# Patient Record
Sex: Male | Born: 1935 | Race: White | Hispanic: No | Marital: Married | Smoking: Never smoker
Health system: Southern US, Community
[De-identification: ages and names within clinical notes are randomized; demographics above are authoritative.]

## PROBLEM LIST (undated history)

## (undated) DIAGNOSIS — R443 Hallucinations, unspecified: Secondary | ICD-10-CM

## (undated) DIAGNOSIS — D518 Other vitamin B12 deficiency anemias: Secondary | ICD-10-CM

## (undated) DIAGNOSIS — G20 Parkinson's disease: Secondary | ICD-10-CM

## (undated) DIAGNOSIS — G459 Transient cerebral ischemic attack, unspecified: Secondary | ICD-10-CM

## (undated) DIAGNOSIS — F068 Other specified mental disorders due to known physiological condition: Secondary | ICD-10-CM

## (undated) HISTORY — DX: Other specified mental disorders due to known physiological condition: F06.8

## (undated) HISTORY — PX: APPENDECTOMY: SHX54

## (undated) HISTORY — DX: Parkinson's disease: G20

## (undated) HISTORY — PX: TRANSURETHRAL RESECTION OF PROSTATE: SHX73

## (undated) HISTORY — PX: CHOLECYSTECTOMY: SHX55

## (undated) HISTORY — DX: Other vitamin B12 deficiency anemias: D51.8

## (undated) HISTORY — DX: Transient cerebral ischemic attack, unspecified: G45.9

## (undated) HISTORY — DX: Hallucinations, unspecified: R44.3

---

## 1985-05-15 HISTORY — PX: PTCA: SHX146

## 2001-09-13 ENCOUNTER — Encounter: Admission: RE | Admit: 2001-09-13 | Discharge: 2001-09-13 | Payer: Self-pay | Admitting: Family Medicine

## 2001-09-13 ENCOUNTER — Encounter: Payer: Self-pay | Admitting: Family Medicine

## 2004-06-13 ENCOUNTER — Encounter: Admission: RE | Admit: 2004-06-13 | Discharge: 2004-06-13 | Payer: Self-pay | Admitting: Family Medicine

## 2006-12-10 ENCOUNTER — Encounter: Admission: RE | Admit: 2006-12-10 | Discharge: 2006-12-10 | Payer: Self-pay | Admitting: Family Medicine

## 2006-12-17 ENCOUNTER — Encounter: Admission: RE | Admit: 2006-12-17 | Discharge: 2006-12-17 | Payer: Self-pay | Admitting: Family Medicine

## 2008-07-02 ENCOUNTER — Emergency Department (HOSPITAL_COMMUNITY): Admission: EM | Admit: 2008-07-02 | Discharge: 2008-07-02 | Payer: Self-pay | Admitting: Emergency Medicine

## 2009-07-08 ENCOUNTER — Encounter (INDEPENDENT_AMBULATORY_CARE_PROVIDER_SITE_OTHER): Payer: Self-pay | Admitting: Internal Medicine

## 2009-07-08 ENCOUNTER — Ambulatory Visit: Payer: Self-pay | Admitting: Surgery

## 2009-07-08 ENCOUNTER — Inpatient Hospital Stay (HOSPITAL_COMMUNITY): Admission: EM | Admit: 2009-07-08 | Discharge: 2009-07-09 | Payer: Self-pay | Admitting: Emergency Medicine

## 2010-08-03 LAB — CBC
HCT: 37.3 % — ABNORMAL LOW (ref 39.0–52.0)
MCV: 92.2 fL (ref 78.0–100.0)
Platelets: 185 10*3/uL (ref 150–400)
RDW: 13.5 % (ref 11.5–15.5)
WBC: 8 10*3/uL (ref 4.0–10.5)

## 2010-08-03 LAB — COMPREHENSIVE METABOLIC PANEL
AST: 16 U/L (ref 0–37)
Alkaline Phosphatase: 58 U/L (ref 39–117)
CO2: 24 mEq/L (ref 19–32)
Chloride: 102 mEq/L (ref 96–112)
Creatinine, Ser: 1.1 mg/dL (ref 0.4–1.5)
GFR calc Af Amer: >60 mL/min (ref >60)
GFR calc non Af Amer: >60 mL/min (ref >60)
Total Bilirubin: 0.7 mg/dL (ref 0.3–1.2)

## 2010-08-03 LAB — URINALYSIS, ROUTINE W REFLEX MICROSCOPIC
Bilirubin Urine: NEGATIVE
Glucose, UA: NEGATIVE mg/dL
Hgb urine dipstick: NEGATIVE
Ketones, ur: 15 mg/dL — AB
pH: 6 (ref 5.0–8.0)

## 2010-08-03 LAB — DIFFERENTIAL
Basophils Absolute: 0 10*3/uL (ref 0.0–0.1)
Basophils Relative: 0 % (ref 0–1)
Eosinophils Absolute: 0.1 10*3/uL (ref 0.0–0.7)
Eosinophils Relative: 1 % (ref 0–5)
Lymphocytes Relative: 9 % — ABNORMAL LOW (ref 12–46)

## 2010-08-03 LAB — TROPONIN I: Troponin I: 0.02 ng/mL (ref 0.00–0.06)

## 2010-08-03 LAB — PROTIME-INR: Prothrombin Time: 13.8 seconds (ref 11.6–15.2)

## 2012-08-06 ENCOUNTER — Ambulatory Visit (INDEPENDENT_AMBULATORY_CARE_PROVIDER_SITE_OTHER): Payer: Medicare Other | Admitting: *Deleted

## 2012-08-06 DIAGNOSIS — D518 Other vitamin B12 deficiency anemias: Secondary | ICD-10-CM

## 2012-08-06 MED ORDER — CYANOCOBALAMIN 1000 MCG/ML IJ SOLN
1000.0000 ug | INTRAMUSCULAR | Status: AC
  Administered 2012-08-06 – 2012-12-26 (×5): 1000 ug via INTRAMUSCULAR

## 2012-08-06 NOTE — Progress Notes (Signed)
Pt here for B 12 injection.  Under aseptic technique cyanocobalamin 1000mcg/1ml IM given L deltoid.  Tolerated well.  Bandaid applied.  

## 2012-08-28 ENCOUNTER — Other Ambulatory Visit: Payer: Self-pay

## 2012-08-28 MED ORDER — CARBIDOPA-LEVODOPA 25-100 MG PO TABS: ORAL_TABLET | ORAL | Status: DC

## 2012-08-28 NOTE — Telephone Encounter (Signed)
Former [facility] patient assigned to Dr Athar.  

## 2012-09-11 ENCOUNTER — Ambulatory Visit: Payer: Medicare Other

## 2012-09-16 ENCOUNTER — Ambulatory Visit (INDEPENDENT_AMBULATORY_CARE_PROVIDER_SITE_OTHER): Payer: Medicare Other | Admitting: Neurology

## 2012-09-16 DIAGNOSIS — D518 Other vitamin B12 deficiency anemias: Secondary | ICD-10-CM

## 2012-09-16 DIAGNOSIS — E538 Deficiency of other specified B group vitamins: Secondary | ICD-10-CM | POA: Insufficient documentation

## 2012-09-16 NOTE — Progress Notes (Signed)
Patient here for B12 injection.  Under aseptic technique Cyanocobalamin 1000mcg/1ml given IM in right deltoid.  Tolerated well.  Band-Aid applied. 

## 2012-09-16 NOTE — Patient Instructions (Signed)
Patient will return on June 2,2014 for next B12 injection.

## 2012-10-14 ENCOUNTER — Ambulatory Visit (INDEPENDENT_AMBULATORY_CARE_PROVIDER_SITE_OTHER): Payer: Medicare Other | Admitting: *Deleted

## 2012-10-14 DIAGNOSIS — E538 Deficiency of other specified B group vitamins: Secondary | ICD-10-CM

## 2012-10-14 DIAGNOSIS — D518 Other vitamin B12 deficiency anemias: Secondary | ICD-10-CM

## 2012-10-14 NOTE — Progress Notes (Signed)
Pt here for B 12 injection.  Under aseptic technique cyanocobalamin 1000mcg/1ml IM given L deltoid.  Tolerated well.  Bandaid applied.  

## 2012-10-14 NOTE — Patient Instructions (Signed)
Pt to return in 1 month for B 12 injection.

## 2012-11-13 ENCOUNTER — Ambulatory Visit (INDEPENDENT_AMBULATORY_CARE_PROVIDER_SITE_OTHER): Payer: Medicare Other | Admitting: Neurology

## 2012-11-13 DIAGNOSIS — E538 Deficiency of other specified B group vitamins: Secondary | ICD-10-CM

## 2012-11-13 DIAGNOSIS — D518 Other vitamin B12 deficiency anemias: Secondary | ICD-10-CM

## 2012-11-13 NOTE — Progress Notes (Signed)
Patient here for B12 injection.  Under aseptic technique Cyanocobalamin 1000mcg/1ml given IM in left deltoid.  Tolerated well.  Band-Aid applied. 

## 2012-11-13 NOTE — Patient Instructions (Signed)
Patient will return on 12-11-12 for next injection, but call prior to be sure we have B12 available.

## 2012-11-25 ENCOUNTER — Encounter: Payer: Self-pay | Admitting: Neurology

## 2012-11-25 ENCOUNTER — Ambulatory Visit (INDEPENDENT_AMBULATORY_CARE_PROVIDER_SITE_OTHER): Payer: Medicare Other | Admitting: Neurology

## 2012-11-25 VITALS — BP 127/65 | HR 63 | Temp 97.5°F | Ht 60.0 in | Wt 144.0 lb

## 2012-11-25 DIAGNOSIS — F0390 Unspecified dementia without behavioral disturbance: Secondary | ICD-10-CM

## 2012-11-25 DIAGNOSIS — G20 Parkinson's disease: Secondary | ICD-10-CM

## 2012-11-25 MED ORDER — DONEPEZIL HCL 10 MG PO TABS: 10.0000 mg | ORAL_TABLET | Freq: Every day | ORAL | Status: DC

## 2012-11-25 MED ORDER — CARBIDOPA-LEVODOPA 25-100 MG PO TABS: ORAL_TABLET | ORAL | Status: DC

## 2012-11-25 NOTE — Progress Notes (Signed)
Subjective:    Patient ID: Michael [name] is a 79 y.o. male.  HPI  Interim history:   [name] is a very pleasant 79 year old right-handed gentleman who presents for followup consultation of his Parkinson's disease.He is accompanied by his wife today. This is his first visit with me and he previously followed with Dr. Avie [name] and was last seen by him on 07/18/2012, at which time Dr. Sandria [name] felt that he most likely has Parkinson's disease versus Lewy body dementia. He started in ampicillin 5 mg daily. He felt that we may need to cut down his levodopa. The patient has an underlying medical history of vitamin B12 deficiency, chronic low back pain, parkinsonism, angioplasty in 1987, prostate surgery. He currently is on donepezil 5 mg daily, stool softener, Zantac, Sinemet 25-100 mg strength 1-1/2 pills in the morning and lunch and one pill at night, daily aspirin, fish oil, vitamin B 12 injection, VESIcare.  I reviewed Dr. Imagene [name] prior notes and the patient's records and below is a summary of that review:  79 year old right-handed gentleman with a family history of Parkinson's disease who was diagnosed with Parkinson's disease in May 2008 and was initially placed on Requip but developed hallucinations and was switched to Sinemet in September 2008. His symptoms may date back to 2006. He had postural instability and fell in April 2010 fracturing 2 bones in his right wrist and to bones in his left thumb. He had sudden onset of left arm tingling and weakness and was seen at [facility] but was not felt to be a TPA candidate. CT of the head showed chronic small vessel disease. MRI brain showed normal MRI for age. EKG showed first degree AV block. Doppler study of the carotids was normal. Echocardiogram showed an EF of 60-65%. He has had hallucinations and memory loss. In May 2013 his MMSE was 17, clock drawing was 2, animal fluency was 14. In March 2014 his MMSE was 20, clock drawing was 3, animal  fluency was 6. His sister was 27 yo when she past away and she had PD.   He has been tolerating the Aricept 5 mg daily and this seems to have helped the hallucinations, which now are about 1/week. He may see animals or people in the house, he has also been smelling cigarette smoke in the house. His back bothers him a lot. He has been taking tylenol and has taken epidural injections, 4-5 years ago.  His Past Medical History Is Significant For: Past Medical History  Diagnosis Date  . 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 Unspecified transient cerebral ischemia   . Hallucinations   . Other vitamin B12 deficiency anemia   . Paralysis agitans     His Past Surgical History Is Significant For: Past Surgical History  Procedure Laterality Date  . Cholecystectomy    . Transurethral resection of prostate    . Appendectomy    . Ptca  1987    His Family History Is Significant For: Family History  Problem Relation Age of Onset  . Parkinson's disease Sister   . Cancer Brother   . Parkinson's disease Daughter 33    His Social History Is Significant For: History   Social History  . Marital Status: Married    Spouse Name: N/A    Number of Children: N/A  . Years of Education: N/A   Social History Main Topics  . Smoking status: Never Smoker   . Smokeless tobacco: None  .  Alcohol Use: No  . Drug Use: No  . Sexually Active: None   Other Topics Concern  . None   Social History Narrative  . None    His Allergies Are:  No Known Allergies:   His Current Medications Are:  Outpatient Encounter Prescriptions as of 11/25/2012  Medication Sig Dispense Refill  . aspirin 325 MG tablet Take 325 mg by mouth daily.      . carbidopa-levodopa (SINEMET IR) 25-100 MG per tablet 1 tablets po in the morning and at lunch and 1 tablet po in the evening  120 tablet  5  . donepezil (ARICEPT) 5 MG tablet Take 5 mg by mouth daily.      . fish oil-omega-3 fatty  acids 1000 MG capsule Take 1,200 mg by mouth daily.      . hydrocortisone 2.5 % cream       . VESICARE 10 MG tablet        Facility-Administered Encounter Medications as of 11/25/2012  Medication Dose Route Frequency Provider Last Rate Last Dose  . cyanocobalamin ((VITAMIN B-12)) injection 1,000 mcg  1,000 mcg Intramuscular Q30 days Michael Foley, MD   1,000 mcg at 11/13/12 1420  :  Review of Systems  HENT: Positive for hearing loss.   Respiratory:       Snoring  Gastrointestinal: Positive for constipation.       Incontinence  Genitourinary: Positive for difficulty urinating.  Musculoskeletal: Positive for arthralgias.  Neurological: Positive for tremors and speech difficulty.       Memory loss  Psychiatric/Behavioral: Positive for hallucinations and confusion.       Sleepiness    Objective:  Neurologic Exam  Physical Exam Physical Examination:   Filed Vitals:   11/25/12 1106  BP: 127/65  Pulse: 63  Temp: 97.5 F (36.4 C)    General Examination: The patient is a very pleasant 79 y.o. male in no acute distress.  HEENT: Normocephalic, atraumatic, pupils are equal, round and reactive to light and accommodation. Funduscopic exam is normal with sharp disc margins noted. Extraocular tracking shows mild saccadic breakdown without nystagmus noted. There is limitation to upper gaze. There is mild decrease in eye blink rate. Hearing is intact. Face is symmetric with moderate facial masking and normal facial sensation. There is no lip, neck or jaw tremor. Neck is moderately rigid with intact passive ROM. There are no carotid bruits on auscultation. Oropharynx exam reveals mild mouth dryness. No significant airway crowding is noted. Mallampati is class II. Tongue protrudes centrally and palate elevates symmetrically. Poor dental hygiene is noted.   There is mild drooling.   Chest: is clear to auscultation without wheezing, rhonchi or crackles noted.  Heart: sounds are regular and normal  without murmurs, rubs or gallops noted.   Abdomen: is soft, non-tender and non-distended with normal bowel sounds appreciated on auscultation.  Extremities: There is no pitting edema in the distal lower extremities bilaterally. Chronic stasis-like changes are noted in the distal legs bilaterally. There are no varicose veins. He has mild swelling at his R wrist and decrease in ROM in his R forearm. Not new. He is missing the tip of his R index finger.  Skin: is warm and dry with no trophic changes noted. Age-related changes are noted on the skin.   Musculoskeletal: exam reveals no obvious joint deformities, tenderness, joint swelling or erythema.  Neurologically:  Mental status: The patient is awake and alert, paying good  attention. He is able to partially provide the history. His  wife provides details. He is oriented to: person, place, situation, day of week and month of year. His memory, attention, language and knowledge are impaired. There is no aphasia, agnosia, apraxia or anomia. There is a mild degree of bradyphrenia. Speech is moderately hypophonic with mild dysarthria noted. Mood is congruent and affect is normal.    Cranial nerves are as described above under HEENT exam. In addition, shoulder shrug is normal with equal shoulder height noted.  Motor exam: Normal bulk, and strength for age is noted. There are no dyskinesias noted.   Tone is mildly rigid with presence of cogwheeling in the right upper extremity. There is overall moderate bradykinesia. There is no drift or rebound.  There is no resting tremor. Reflexes are 2+ in the upper extremities and trace in the lower extremities.   Fine motor skills exam: Finger taps are moderately impaired on the right and moderately impaired on the left. Hand movements are mildly impaired on the right and mildly impaired on the left. RAP (rapid alternating patting) is moderately impaired on the right and mildly impaired on the left. Foot taps are  moderately impaired on the right and mildly impaired on the left. Foot agility (in the form of heel stomping) is moderately impaired on the right and moderately impaired on the left.    Cerebellar testing shows no dysmetria or intention tremor on finger to nose testing. Heel to shin is unremarkable bilaterally. There is no truncal or gait ataxia.   Sensory exam is intact to light touch.   Gait, station and balance: He stands up from the seated position with mild difficulty and does not need to push up with His hands. He needs no assistance. No veering to one side is noted. He is not noted to lean to the side. Posture is moderately stooped. Stance is wide-based. He walks with decrease in stride length and pace and decreased arm swing; right/left:[id number]}. He turns in 4 steps/en bloc. Tandem walk is not possible. Balance is mildly impaired. He is not able to do a toe or heel stance.      Assessment and Plan:   Assessment and Plan:  In summary, Michael [name] is a very pleasant 79 y.o.-year old male with a history of Parkinson's disease, right-sided predominant, complicated by chronic back pain, dementia, hallucinations. His physical exam is stable and has not progressed in the last 4 months. He is doing fairly well at this time and I reassured the patient and his wife in that regard. She reports some urinary problems, including incontinence and I offered a urology referral, but they want to hold off for now.  I had a long chat with the patient and his wife about His symptoms, my findings and the diagnosis of Parksinson's disease, its prognosis and treatment options. We talked about medical treatments and non-pharmacological approaches. We talked about maintaining a healthy lifestyle in general. I encouraged the patient to eat healthy, exercise daily and keep well hydrated, to keep a scheduled bedtime and wake time routine, to not skip any meals and eat healthy snacks in between meals and to have protein  with every meal. In particular, I stressed the importance of regular exercise, within of course the patient's own mobility limitations. I would like for him to use his cane.  As far as further diagnostic testing is concerned, I suggested: no change.  As far as medications are concerned, I recommended the following at this time: continue with Sinemet. Increase Aricept to the full 10  mg. I answered all their questions today and the patient and his wife were in agreement with the above outlined plan. I would like to see the patient back in 4 months, sooner if the need arises and encouraged them to call with any interim questions, concerns, problems or updates and refill requests and/or referral to urology.

## 2012-11-25 NOTE — Patient Instructions (Addendum)
I think overall you are doing fairly well but I do want to suggest a few things today:  Remember to drink plenty of fluid, eat healthy meals and do not skip any meals. Try to eat protein with a every meal and eat a healthy snack such as fruit or nuts in between meals. Try to keep a regular sleep-wake schedule and try to exercise daily, particularly in the form of walking, 20-30 minutes a day, if you can.   Engage in social activities in your community and with your family and try to keep up with current events by reading the newspaper or watching the news.   As far as your medications are concerned, I would like to suggest  Increasing your Aricept to 10 mg once daily in the evening.   If you wish to see a urologist, we can make a referral.  As far as diagnostic testing: no new test.   I would like to see you back in 4 months, sooner if we need to. Please call us with any interim questions, concerns, problems, updates or refill requests.  Please also call us for any test results so we can go over those with you on the phone. Brett Canales is my clinical assistant and will answer any of your questions and relay your messages to me and also relay most of my messages to you.  Our phone number is [phone]. We also have an after hours call service for urgent matters and there is a physician on-call for urgent questions. For any emergencies you know to call 911 or go to the nearest emergency room.

## 2012-12-11 ENCOUNTER — Ambulatory Visit: Payer: Self-pay

## 2012-12-26 ENCOUNTER — Ambulatory Visit (INDEPENDENT_AMBULATORY_CARE_PROVIDER_SITE_OTHER): Payer: Medicare Other | Admitting: *Deleted

## 2012-12-26 DIAGNOSIS — D518 Other vitamin B12 deficiency anemias: Secondary | ICD-10-CM

## 2012-12-26 DIAGNOSIS — E538 Deficiency of other specified B group vitamins: Secondary | ICD-10-CM

## 2012-12-26 NOTE — Progress Notes (Signed)
Pt here for B 12 injection.  Under aseptic technique cyanocobalamin 1000mcg/1ml IM given L Deltoid.   Tolerated well.  Bandaid applied.  

## 2012-12-26 NOTE — Patient Instructions (Signed)
See next month. 

## 2013-01-23 ENCOUNTER — Ambulatory Visit (INDEPENDENT_AMBULATORY_CARE_PROVIDER_SITE_OTHER): Payer: Medicare Other

## 2013-01-23 DIAGNOSIS — E538 Deficiency of other specified B group vitamins: Secondary | ICD-10-CM

## 2013-01-23 MED ORDER — CYANOCOBALAMIN 1000 MCG/ML IJ SOLN
1000.0000 ug | Freq: Once | INTRAMUSCULAR | Status: AC
  Administered 2013-01-23: 1000 ug via INTRAMUSCULAR

## 2013-01-23 NOTE — Patient Instructions (Signed)
Follow up in one month.

## 2013-01-23 NOTE — Progress Notes (Signed)
Cyanocobalamin administered in R Deltoid under aseptic technique. Tolerated well. Band aid applied.

## 2013-02-24 ENCOUNTER — Ambulatory Visit (INDEPENDENT_AMBULATORY_CARE_PROVIDER_SITE_OTHER): Payer: Medicare Other

## 2013-02-24 DIAGNOSIS — E538 Deficiency of other specified B group vitamins: Secondary | ICD-10-CM

## 2013-02-24 MED ORDER — CYANOCOBALAMIN 1000 MCG/ML IJ SOLN
1000.0000 ug | Freq: Once | INTRAMUSCULAR | Status: AC
  Administered 2013-02-24: 1000 ug via INTRAMUSCULAR

## 2013-02-24 NOTE — Progress Notes (Signed)
Cyanocobalamin 1000 mcg administered in R Deltoid. Tolerated well.

## 2013-02-24 NOTE — Patient Instructions (Signed)
Return in 30 days. 

## 2013-03-27 ENCOUNTER — Ambulatory Visit (INDEPENDENT_AMBULATORY_CARE_PROVIDER_SITE_OTHER): Payer: Medicare Other | Admitting: Neurology

## 2013-03-27 ENCOUNTER — Encounter: Payer: Self-pay | Admitting: Neurology

## 2013-03-27 ENCOUNTER — Ambulatory Visit (INDEPENDENT_AMBULATORY_CARE_PROVIDER_SITE_OTHER): Payer: Self-pay | Admitting: *Deleted

## 2013-03-27 VITALS — BP 106/68 | HR 64 | Temp 97.6°F | Ht 60.0 in | Wt 147.0 lb

## 2013-03-27 DIAGNOSIS — Z0289 Encounter for other administrative examinations: Secondary | ICD-10-CM

## 2013-03-27 DIAGNOSIS — F0390 Unspecified dementia without behavioral disturbance: Secondary | ICD-10-CM

## 2013-03-27 DIAGNOSIS — G20 Parkinson's disease: Secondary | ICD-10-CM

## 2013-03-27 DIAGNOSIS — E538 Deficiency of other specified B group vitamins: Secondary | ICD-10-CM

## 2013-03-27 DIAGNOSIS — R32 Unspecified urinary incontinence: Secondary | ICD-10-CM

## 2013-03-27 HISTORY — DX: Parkinson's disease: G20

## 2013-03-27 MED ORDER — DONEPEZIL HCL 10 MG PO TABS: 10.0000 mg | ORAL_TABLET | Freq: Every day | ORAL | Status: DC

## 2013-03-27 MED ORDER — CYANOCOBALAMIN 1000 MCG/ML IJ SOLN
1000.0000 ug | INTRAMUSCULAR | Status: AC
  Administered 2013-03-27 – 2013-08-15 (×5): 1000 ug via INTRAMUSCULAR

## 2013-03-27 MED ORDER — OXYBUTYNIN CHLORIDE 5 MG PO TABS: ORAL_TABLET | ORAL | Status: DC

## 2013-03-27 MED ORDER — CARBIDOPA-LEVODOPA 25-100 MG PO TABS: ORAL_TABLET | ORAL | Status: DC

## 2013-03-27 NOTE — Patient Instructions (Addendum)
I think your Parkinson's disease has remained fairly stable, which is reassuring. Nevertheless, as you know, this disease does progress with time. It can affect your balance, your memory, your mood, your bowel and bladder function, your posture, balance and walking. Overall you are doing fairly well but I do want to suggest a few things today:  Remember to drink plenty of fluid, eat healthy meals and do not skip any meals. Try to eat protein with a every meal and eat a healthy snack such as fruit or nuts in between meals. Try to keep a regular sleep-wake schedule and try to exercise daily, particularly in the form of walking, 20-30 minutes a day, if you can.   Taking your medication on schedule is key.   Try to stay active physically and mentally. Engage in social activities in your community and with your family and try to keep up with current events by reading the newspaper or watching the news. Try to do word puzzles and you may like to do word puzzles and brain games on the computer such as on [email or web address]   As far as your medications are concerned, I would like to suggest that you take your current medication with the following additional changes: we will try you on ditropan for your bladder. Common side effects include drowsiness, confusion, hallucinations, balance problems.   As far as diagnostic testing, I will order: no new test.    I would like to see you back in 4 months, sooner if we need to. Please call us with any interim questions, concerns, problems, updates or refill requests.  Brett Canales is my clinical assistant and will answer any of your questions and relay your messages to me and also relay most of my messages to you.  Our phone number is [phone]. We also have an after hours call service for urgent matters and there is a physician on-call for urgent questions, that cannot wait till the next work day. For any emergencies you know to call 911 or go to the nearest emergency room.

## 2013-03-27 NOTE — Progress Notes (Signed)
Subjective:    Patient ID: Michael [name] is a 79 y.o. male.  HPI  Interim history:   Michael [name] is a very pleasant 79 year old right-handed gentleman who presents for followup consultation of his right-sided predominant Parkinson's disease, complicated by chronic back pain, dementia, hallucinations and urinary incontinence. He is accompanied by his wife again today. I first met him on 11/25/2012, and which time I did not change his Sinemet dose but increased his Aricept to 10 mg once daily. I suggested a urology referral but they declined. His wife states, they cannot afford the Vesicare and is requesting an alternative. She feels, the increase in Aricept has helped. He is tolerating it. He had the R cataract removed in October 2014 and is scheduled for L cataract removal on 04/01/13. He has drooling.  He previously followed with Dr. Avie Echevaria and was last seen by him on 07/18/2012, at which time Dr. Sandria Manly felt that he most likely has Parkinson's disease versus Lewy body dementia. He started Aricept 5 mg daily. He felt that we may need to cut down his levodopa. The patient has an underlying medical history of vitamin B12 deficiency, chronic low back pain, parkinsonism, angioplasty in 1987, prostate surgery.  He has a family history of Parkinson's disease in his sister who passed away at the age of 60. His daughter was diagnosed with PD in 3/14. She is 77 yo. He was diagnosed with Parkinson's disease in May 2008 and was initially placed on Requip but developed hallucinations and was switched to Sinemet in September 2008. His symptoms may date back to 2006. He had postural instability and fell in April 2010 fracturing 2 bones in his right wrist and two bones in his left thumb. He had sudden onset of left arm tingling and weakness and was seen at [facility] but was not felt to be a TPA candidate. CT of the head showed chronic small vessel disease. MRI brain showed normal MRI for age. EKG showed  first degree AV block. Doppler study of the carotids was normal. Echocardiogram showed an EF of 60-65%. He has had hallucinations and memory loss. In May 2013 his MMSE was 17/30, CDT was 2/4, AFT was 14/minute. In March 2014 his MMSE was 20/30, CDT was 3/4, AFT was 6/minute.  His back bothers him a lot. He has been taking tylenol and has taken epidural injections, 4-5 years ago.   His Past Medical History Is Significant For: Past Medical History  Diagnosis Date  . 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 Unspecified transient cerebral ischemia   . Hallucinations   . Other vitamin B12 deficiency anemia   . Paralysis agitans     His Past Surgical History Is Significant For: Past Surgical History  Procedure Laterality Date  . Cholecystectomy    . Transurethral resection of prostate    . Appendectomy    . Ptca  1987    His Family History Is Significant For: Family History  Problem Relation Age of Onset  . Parkinson's disease Sister   . Cancer Brother   . Parkinson's disease Daughter 48    His Social History Is Significant For: History   Social History  . Marital Status: Married    Spouse Name: N/A    Number of Children: N/A  . Years of Education: N/A   Social History Main Topics  . Smoking status: Never Smoker   . Smokeless tobacco: None  . Alcohol Use: No  .  Drug Use: No  . Sexual Activity: None   Other Topics Concern  . None   Social History Narrative  . None    His Allergies Are:  No Known Allergies:   His Current Medications Are:  Outpatient Encounter Prescriptions as of 03/27/2013  Medication Sig  . aspirin 325 MG tablet Take 325 mg by mouth daily.  . carbidopa-levodopa (SINEMET IR) 25-100 MG per tablet 1 tablets po in the morning and at lunch and 1 tablet po in the evening  . donepezil (ARICEPT) 10 MG tablet Take 1 tablet (10 mg total) by mouth at bedtime.  . fish oil-omega-3 fatty acids 1000 MG capsule Take 1,200  mg by mouth daily.  . hydrocortisone 2.5 % cream   . VESICARE 10 MG tablet   :  Review of Systems:  Out of a complete 14 point review of systems, all are reviewed and negative with the exception of these symptoms as listed below:   Review of Systems  Constitutional: Negative.   HENT: Positive for hearing loss.   Eyes: Negative.   Respiratory: Positive for cough.   Cardiovascular: Negative.   Gastrointestinal: Positive for constipation.  Endocrine: Negative.   Genitourinary: Negative.   Musculoskeletal:       Muscle cramps  Skin: Negative.   Allergic/Immunologic: Negative.   Neurological: Positive for tremors.       Memory loss  Hematological: Negative.   Psychiatric/Behavioral: Positive for hallucinations.    Objective:  Neurologic Exam  Physical Exam Physical Examination:   Filed Vitals:   03/27/13 1436  BP: 106/68  Pulse: 64  Temp: 97.6 F (36.4 C)    General Examination: The patient is a very pleasant 79 y.o. male in no acute distress.  HEENT: Normocephalic, atraumatic, pupils are equal, round and reactive to light and accommodation. Extraocular tracking shows mild saccadic breakdown without nystagmus noted. There is limitation to upper gaze. There is mild decrease in eye blink rate. Hearing is impaired. Face is symmetric with moderate facial masking and normal facial sensation. There is no lip, neck or jaw tremor. Neck is moderately rigid with intact passive ROM. There are no carotid bruits on auscultation. Oropharynx exam reveals mild mouth dryness. No significant airway crowding is noted. Mallampati is class II. Tongue protrudes centrally and palate elevates symmetrically. Poor dental hygiene is noted. There is mild drooling.   Chest: is clear to auscultation without wheezing, rhonchi or crackles noted.  Heart: sounds are regular and normal without murmurs, rubs or gallops noted.   Abdomen: is soft, non-tender and non-distended with normal bowel sounds  appreciated on auscultation.  Extremities: There is no pitting edema in the distal lower extremities bilaterally. There are no varicose veins. He has mild swelling at his R wrist and decrease in ROM in his R forearm. Not new. He is missing the tip of his R index finger.  Skin: is warm and dry with no trophic changes noted. Age-related changes are noted on the skin.   Musculoskeletal: exam reveals no obvious joint deformities, tenderness, joint swelling or erythema.  Neurologically:  Mental status: The patient is awake and alert, paying good  attention. He is not able to his history very well. His wife provides almost the entire. He is oriented to: person, place, situation, day of week and month of year. His memory, attention, language and knowledge are impaired. There is no aphasia, agnosia, apraxia or anomia. There is a moderate degree of bradyphrenia. Speech is moderately hypophonic with mild dysarthria noted. Mood is  congruent and affect is normal.    Cranial nerves are as described above under HEENT exam. In addition, shoulder shrug is normal with equal shoulder height noted.  Motor exam: Normal bulk, and strength for age is noted. There are no dyskinesias noted.   Tone is mildly rigid with presence of cogwheeling in the right upper extremity. There is overall moderate bradykinesia. There is no drift or rebound.  There is no resting tremor. Reflexes are 2+ in the upper extremities and trace in the lower extremities.   Fine motor skills exam: Finger taps are moderately impaired on the right and moderately impaired on the left. Hand movements are mildly impaired on the right and mildly impaired on the left. RAP (rapid alternating patting) is moderately impaired on the right and mildly impaired on the left. Foot taps are moderately impaired on the right and mildly impaired on the left. Foot agility (in the form of heel stomping) is moderately impaired on the right and moderately impaired on the  left.    Cerebellar testing shows no dysmetria or intention tremor on finger to nose testing. Heel to shin is unremarkable bilaterally. There is no truncal or gait ataxia.   Sensory exam is intact to light touch.   Gait, station and balance: He stands up from the seated position with mild difficulty and does not need to push up with His hands. He needs no assistance. No veering to one side is noted. He is not noted to lean to the side. Posture is moderately to severely stooped. Stance is wide-based. He walks with decrease in stride length and pace and decreased arm swing. He turns in 4 steps. Tandem walk is not possible. Balance is mildly impaired. He is not able to do a toe or heel stance.     Assessment and Plan:   In summary, Michael [name] is a very pleasant 79 year old male with a history of Parkinson's disease, right-sided predominant, complicated by chronic back pain, dementia, hallucinations and urinary incontinence. He has advanced PD, but seems stable. I reassured the patient and his wife in that regard. I had a long chat with the patient and his wife about His symptoms, my findings and the diagnosis of advanced Parksinson's disease, its prognosis and treatment options. We talked about medical treatments and non-pharmacological approaches. We talked about maintaining a healthy lifestyle in general. I encouraged the patient to eat healthy, exercise daily and keep well hydrated, to keep a scheduled bedtime and wake time routine, to not skip any meals and eat healthy snacks in between meals and to have protein with every meal. In particular, I stressed the importance of regular exercise, within of course the patient's own mobility limitations. I would like for him to use his cane at all time.  As far as further diagnostic testing is concerned, I suggested: no change.  As far as medications are concerned, I recommended the following at this time: continue with Sinemet and Aricept and I suggested a  trial of Ditropan. I told them about potential SEs to look out for. I renewed his other prescriptions as well.  I answered all their questions today and the patient and his wife were in agreement with the above outlined plan. I would like to see the patient back in 4 months, sooner if the need arises and encouraged them to call with any interim questions, concerns, problems or updates and refill request.

## 2013-03-27 NOTE — Patient Instructions (Addendum)
See next month. 

## 2013-03-27 NOTE — Progress Notes (Signed)
Pt here for B 12 injection.  Under aseptic technique cyanocobalamin 1000mcg/1ml IM given R deltoid.  Tolerated well.  Bandaid applied.  

## 2013-04-08 ENCOUNTER — Encounter: Payer: Self-pay | Admitting: Podiatry

## 2013-04-08 ENCOUNTER — Ambulatory Visit (INDEPENDENT_AMBULATORY_CARE_PROVIDER_SITE_OTHER): Payer: Medicare Other | Admitting: Podiatry

## 2013-04-08 VITALS — BP 170/68 | HR 62 | Resp 14 | Ht 60.0 in | Wt 147.0 lb

## 2013-04-08 DIAGNOSIS — M2040 Other hammer toe(s) (acquired), unspecified foot: Secondary | ICD-10-CM

## 2013-04-08 DIAGNOSIS — M79609 Pain in unspecified limb: Secondary | ICD-10-CM

## 2013-04-08 DIAGNOSIS — B351 Tinea unguium: Secondary | ICD-10-CM

## 2013-04-08 NOTE — Progress Notes (Signed)
Michael [name] presents today as an 79 year old white male with a history of Parkinson's disease planing of painful bilateral nails. Seemed to be gradually getting worse and his wife and family are unable to trim them. He had his great toe nails removed in the past. Shoe gear ever rates them is done nothing to try to help it.  Objective: I reviewed his past medical history medications and allergies. Vital signs are stable alert oriented x3. Pulses are strongly palpable bilateral lower extremity. Neurologic sensorium is intact per Semmes-Weinstein monofilament. Deep tendon reflexes are intact bilateral. Muscle strength is 5 over 5 dorsiflexors plantar flexors inverters and everters all intrinsic musculature is intact. Orthopedic evaluation demonstrates mild pes planus and all joints distal to the ankle have a full range of motion without crepitus with mild hammertoe deformities. Cutaneous evaluation demonstrates supple well hydrated cutis with exception of the nail plates which were thick yellow dystrophic clinically mycotic and painful on palpation as well as debridement 2 through 5 bilateral.  Assessment: Pain in limb secondary to onychomycosis. Parkinson's disease.  Plan: Debridement of nails in thickness and length as a covered service secondary to pain and Parkinson's.

## 2013-04-08 NOTE — Progress Notes (Signed)
"  I just want to see if we can get these toenails cut"  N -tender  L - toenails bilateral  D - long term  O - gradual C - trim toenails, bilateral hallux nails removed several years ago A - shoes T - none

## 2013-04-28 ENCOUNTER — Ambulatory Visit (INDEPENDENT_AMBULATORY_CARE_PROVIDER_SITE_OTHER): Payer: Medicare Other | Admitting: Neurology

## 2013-04-28 DIAGNOSIS — E538 Deficiency of other specified B group vitamins: Secondary | ICD-10-CM

## 2013-04-28 NOTE — Progress Notes (Signed)
Patient here for B12 injection.  Under aseptic technique Cyanocobalamin 1060mcg/1ml given IM in left deltoid.  Tolerated well.  Band-Aid applied.  Patient was using a cane to help with walking on this visit.

## 2013-04-28 NOTE — Patient Instructions (Signed)
Patient will return next month for B12 injection. 

## 2013-05-29 ENCOUNTER — Ambulatory Visit: Payer: Medicare Other

## 2013-06-02 ENCOUNTER — Ambulatory Visit (INDEPENDENT_AMBULATORY_CARE_PROVIDER_SITE_OTHER): Payer: Medicare Other | Admitting: Neurology

## 2013-06-02 DIAGNOSIS — E538 Deficiency of other specified B group vitamins: Secondary | ICD-10-CM

## 2013-06-02 NOTE — Patient Instructions (Signed)
Patient will return next month for B12 injection. 

## 2013-06-02 NOTE — Progress Notes (Signed)
Patient here for B12 injection.  Under aseptic technique Cyanocobalamin 1000mcg/1ml given IM in left deltoid.  Tolerated well.  Band-Aid applied. 

## 2013-07-03 ENCOUNTER — Ambulatory Visit: Payer: Medicare Other

## 2013-07-08 ENCOUNTER — Ambulatory Visit: Payer: Medicare Other

## 2013-07-08 ENCOUNTER — Ambulatory Visit: Payer: Medicare Other | Admitting: Podiatry

## 2013-07-15 ENCOUNTER — Ambulatory Visit (INDEPENDENT_AMBULATORY_CARE_PROVIDER_SITE_OTHER): Payer: Medicare Other | Admitting: Neurology

## 2013-07-15 ENCOUNTER — Encounter (INDEPENDENT_AMBULATORY_CARE_PROVIDER_SITE_OTHER): Payer: Self-pay

## 2013-07-15 DIAGNOSIS — E538 Deficiency of other specified B group vitamins: Secondary | ICD-10-CM

## 2013-07-15 NOTE — Patient Instructions (Signed)
Patient will return next month for B12 injection. 

## 2013-07-15 NOTE — Progress Notes (Signed)
Patient here for B12 injection.  Under aseptic technique Cyanocobalamin given IM in right deltoid.  Tolerated well.  Band-Aid applied. 

## 2013-08-05 ENCOUNTER — Encounter: Payer: Self-pay | Admitting: Neurology

## 2013-08-05 ENCOUNTER — Ambulatory Visit (INDEPENDENT_AMBULATORY_CARE_PROVIDER_SITE_OTHER): Payer: Medicare Other | Admitting: Neurology

## 2013-08-05 VITALS — BP 144/77 | HR 60 | Temp 97.8°F | Ht 60.0 in | Wt 142.0 lb

## 2013-08-05 DIAGNOSIS — G20 Parkinson's disease: Secondary | ICD-10-CM

## 2013-08-05 DIAGNOSIS — R32 Unspecified urinary incontinence: Secondary | ICD-10-CM

## 2013-08-05 DIAGNOSIS — F0280 Dementia in other diseases classified elsewhere without behavioral disturbance: Secondary | ICD-10-CM

## 2013-08-05 DIAGNOSIS — E538 Deficiency of other specified B group vitamins: Secondary | ICD-10-CM

## 2013-08-05 DIAGNOSIS — K117 Disturbances of salivary secretion: Secondary | ICD-10-CM

## 2013-08-05 NOTE — Progress Notes (Signed)
Subjective:    Patient ID: Michael [name] is a 80 y.o. male.  HPI    Interim history:   Michael [name] is a very pleasant 80 year old right-handed gentleman with an underlying medical history of vitamin B12 deficiency and chronic back pain, s/p epidural injections in the past, and angioplasty in 1987, prostate surgery and bilateral cataract surgeries, who presents for followup consultation of his right-sided predominant Parkinson's disease, complicated by chronic back pain, dementia, hallucinations and urinary incontinence. He is accompanied by his wife again today. I last saw him on 03/27/2013, at which time I suggested a trial of Ditropan for his urinary incontinence. I kept his other medications the same.   Today, he reports feeling well overall, but it turns out he is on both Vesicare 10 mg and oxybutynin 5 mg daily, whereas he was supposed to be on just the ditropan, but she reports that they had a change in insurance and therefore, may have been able to get Vesicare. His main complain is excessive drooling. His son has healthcare POA and they have a daughter, who was diagnosed with PD last year. He has upper dentures, which need to be replaced and a partial plate in the lower, which will be change to full dentures soon. He needs his teeth removed and they don't have an appointment yet. He has a 3 prong cane for outside use, but does not use it inside the house. He has not fallen recently.   I first met him on 11/25/2012, and which time I did not change his Sinemet dose but increased his Aricept to 10 mg once daily. I suggested a urology referral but they declined. He could not afford the Vesicare and they requested an alternative. She felt, the increase in Aricept helped and he was able to tolerate it.  He previously followed with Dr. Morene Antu and was last seen by him on 07/18/2012, at which time Dr. Erling Cruz felt that he most likely has Parkinson's disease versus Lewy body dementia. He started  Aricept 5 mg daily. He felt that we may need to cut down his levodopa.  He has a family history of Parkinson's disease in his sister who passed away at the age of 23. His daughter was diagnosed with PD in 3/14 at the age of 44. He was diagnosed with Parkinson's disease in May 2008 and was initially placed on Requip but developed hallucinations and was switched to Sinemet in September 2008. His symptoms may date back to 2006. He had postural instability and fell in April 2010 fracturing 2 bones in his right wrist and two bones in his left thumb. He had sudden onset of left arm tingling and weakness and was seen at [facility] but was not felt to be a TPA candidate. CT of the head showed chronic small vessel disease. MRI brain showed normal MRI for age. EKG showed first degree AV block. Doppler study of the carotids was normal. Echocardiogram showed an EF of 60-65%. He has had hallucinations and memory loss. In May 2013 his MMSE was 17/30, CDT was 2/4, AFT was 14/minute. In March 2014 his MMSE was 20/30, CDT was 3/4, AFT was 6/minute.   His Past Medical History Is Significant For: Past Medical History  Diagnosis Date  . 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 Unspecified transient cerebral ischemia   . Hallucinations   . Other vitamin B12 deficiency anemia   . Paralysis agitans   . Parkinson's disease 03/27/2013  His Past Surgical History Is Significant For: Past Surgical History  Procedure Laterality Date  . Cholecystectomy    . Transurethral resection of prostate    . Appendectomy    . Ptca  1987    His Family History Is Significant For: Family History  Problem Relation Age of Onset  . Parkinson's disease Sister   . Cancer Brother   . Parkinson's disease Daughter 25    His Social History Is Significant For: History   Social History  . Marital Status: Married    Spouse Name: N/A    Number of Children: N/A  . Years of Education: N/A    Social History Main Topics  . Smoking status: Never Smoker   . Smokeless tobacco: None  . Alcohol Use: No  . Drug Use: No  . Sexual Activity: None   Other Topics Concern  . None   Social History Narrative  . None    His Allergies Are:  No Known Allergies:   His Current Medications Are:  Outpatient Encounter Prescriptions as of 08/05/2013  Medication Sig  . aspirin 325 MG tablet Take 325 mg by mouth daily.  . carbidopa-levodopa (SINEMET IR) 25-100 MG per tablet 1 tablets po in the morning and at lunch and 1 tablet po in the evening  . Cholecalciferol (VITAMIN D-3) 1000 UNITS CAPS Take 1 capsule by mouth daily.  Marland Kitchen donepezil (ARICEPT) 10 MG tablet Take 1 tablet (10 mg total) by mouth at bedtime.  . fish oil-omega-3 fatty acids 1000 MG capsule Take 1,200 mg by mouth daily.  . hydrocortisone 2.5 % cream   . multivitamin-lutein (OCUVITE-LUTEIN) CAPS capsule Take 1 capsule by mouth daily.  Marland Kitchen oxybutynin (DITROPAN) 5 MG tablet Take 1/2 pill each night for 2 weeks then 1 pill each night thereafter.  . VESICARE 10 MG tablet   :  Review of Systems:  Out of a complete 14 point review of systems, all are reviewed and negative with the exception of these symptoms as listed below:   Review of Systems  Constitutional: Positive for fatigue.  HENT: Positive for drooling and hearing loss.   Eyes: Negative.   Respiratory: Negative.   Cardiovascular: Negative.   Gastrointestinal: Positive for constipation.  Endocrine: Negative.   Genitourinary: Negative.   Musculoskeletal: Positive for back pain.  Allergic/Immunologic: Negative.   Neurological: Positive for tremors and speech difficulty.  Hematological: Negative.   Psychiatric/Behavioral: Positive for sleep disturbance (e.d.s.).    Objective:  Neurologic Exam  Physical Exam Physical Examination:   Filed Vitals:   08/05/13 1420  BP: 144/77  Pulse: 60  Temp: 97.8 F (36.6 C)   General Examination: The patient is a very  pleasant 80 y.o. male in no acute distress.  HEENT: Normocephalic, atraumatic, pupils are equal, round and reactive to light and accommodation. Extraocular tracking shows mild saccadic breakdown without nystagmus noted. There is limitation to upper gaze. There is mild decrease in eye blink rate. Hearing is impaired, but he has no hearing aids. Face is symmetric with moderate facial masking and normal facial sensation. There is no lip, neck or jaw tremor. Neck is moderately rigid with intact passive ROM. There are no carotid bruits on auscultation. Oropharynx exam reveals mild mouth dryness. No significant airway crowding is noted. Mallampati is class II. Tongue protrudes centrally and palate elevates symmetrically. Poor dental hygiene is noted, he has 6 lower teeth, he has dentures in the upper, which fit loosely. There is mild to moderate drooling.   Chest: is  clear to auscultation without wheezing, rhonchi or crackles noted.  Heart: sounds are regular and normal without murmurs, rubs or gallops noted.   Abdomen: is soft, non-tender and non-distended with normal bowel sounds appreciated on auscultation.  Extremities: There is no pitting edema in the distal lower extremities bilaterally. There are no varicose veins. He has mild swelling at his R wrist and decrease in ROM in his R forearm, not new. He is missing the tip of his R index finger.  Skin: is warm and dry with no trophic changes noted. Age-related changes are noted on the skin.   Musculoskeletal: exam reveals no obvious joint deformities, tenderness, joint swelling or erythema.  Neurologically:  Mental status: The patient is awake and alert, paying fair attention. He is not able to his history very well. His wife provides almost the entire history. He is oriented to: person, place, situation, day of week and month of year. His memory, attention, language and knowledge are impaired. There is no aphasia, agnosia, apraxia or anomia. There is a  moderate degree of bradyphrenia. Speech is moderately hypophonic with mild dysarthria noted. Mood is congruent and affect is normal.    Cranial nerves are as described above under HEENT exam. In addition, shoulder shrug is normal with equal shoulder height noted.  Motor exam: Normal bulk, and strength for age is noted. There are no dyskinesias noted.   Tone is mildly rigid with presence of cogwheeling in the right upper extremity. There is overall moderate bradykinesia. There is no drift or rebound.  There is no resting tremor. Reflexes are 2+ in the upper extremities and trace in the lower extremities.   Fine motor skills exam: Finger taps are moderately impaired on the right and moderately impaired on the left. Hand movements are mildly impaired on the right and mildly impaired on the left. RAP (rapid alternating patting) is moderately impaired on the right and mildly impaired on the left. Foot taps are moderately impaired on the right and mildly impaired on the left. Foot agility (in the form of heel stomping) is moderately impaired on the right and moderately impaired on the left.    Cerebellar testing shows no dysmetria or intention tremor on finger to nose testing. There is no truncal or gait ataxia.   Sensory exam is intact to light touch, vibration and temperature in the UEs and LEs.   Gait, station and balance: He stands up from the seated position with mild difficulty and does not need to push up with His hands. He needs no assistance. No veering to one side is noted. He is not noted to lean to the side. Posture is moderately to severely stooped. Stance is wide-based. He walks with decrease in stride length and pace and decreased arm swing. He turns in 4 steps. Tandem walk is not possible. Balance is mildly impaired. He is not able to do a toe or heel stance.     Assessment and Plan:   In summary, Michael [name] is a very pleasant 80 year old male with a history of Parkinson's disease,  right-sided predominant, complicated by chronic back pain, dementia, hallucinations and urinary incontinence. He has advanced PD, but seems to be fairly stable, which is reassuring. I would like for him to continue the same medications, but did advise them that he should not take 2 bladder medications. To that end, he is asked to stop the Vesicare 10 mg and continue the Ditropan 5 mg daily. I again discussed the diagnosis of advanced Parksinson's  disease and its complications, its prognosis and treatment options. We talked about medical treatments and non-pharmacological approaches. We talked about maintaining a healthy lifestyle in general. I encouraged the patient to eat healthy, exercise daily and keep well hydrated, to keep a scheduled bedtime and wake time routine, to not skip any meals and eat healthy snacks in between meals and to have protein with every meal. In particular, I stressed the importance of regular exercise, within of course the patient's own mobility limitations. I would like for him to use his cane at all times and he can use his stationary bike.   As far as further diagnostic testing is concerned, I suggested: no change other than above. He is advised to continue with Sinemet, Aricept and Ditropan. I would like to see the patient back in 4 months, sooner if the need arises and encouraged them to call with any interim questions, concerns, problems or updates and refill request. For his sialorrhea, we can consider myobloc injections down the road, but I think, we should hold off until he gets his new dentures and has acclimatized to them. They were in agreement.

## 2013-08-05 NOTE — Patient Instructions (Addendum)
I think your Parkinson's disease has remained fairly stable, which is reassuring. Nevertheless, as you know, this disease does progress with time. It can affect your balance, your memory, your mood, your bowel and bladder function, your posture, balance and walking. Overall you are doing fairly well but I do want to suggest a few things today:  Remember to drink plenty of fluid, eat healthy meals and do not skip any meals. Try to eat protein with a every meal and eat a healthy snack such as fruit or nuts in between meals. Try to keep a regular sleep-wake schedule and try to exercise daily, particularly in the form of walking, 20-30 minutes a day, if you can.   Taking your medication on schedule is key. We will talk about the excess drooling again next time. You may benefit from botulinum toxin injections for drooling.   Try to stay active physically and mentally. Engage in social activities in your community and with your family and try to keep up with current events by reading the newspaper or watching the news. Try to do word puzzles and you may like to do word puzzles and brain games on the computer such as on [email or web address].   As far as your medications are concerned, I would like to suggest that you take your current medication with the following additional changes: Please stop Vesicare 10 mg and continue the ditropan (oxybutynin) 5 mg, as you don't need to be on 2 bladder medications. Everything else will stay the same.     As far as diagnostic testing, I will order: no new test.   I would like to see you back in 4 months, sooner if we need to. Please call us with any interim questions, concerns, problems, updates or refill requests.  Our nursing staff will answer any of your questions and relay your messages to me and also relay most of my messages to you.  Our phone number is [phone]. We also have an after hours call service for urgent matters and there is a physician on-call for urgent  questions, that cannot wait till the next work day. For any emergencies you know to call 911 or go to the nearest emergency room.

## 2013-08-15 ENCOUNTER — Ambulatory Visit (INDEPENDENT_AMBULATORY_CARE_PROVIDER_SITE_OTHER): Payer: Medicare Other | Admitting: Neurology

## 2013-08-15 ENCOUNTER — Encounter (INDEPENDENT_AMBULATORY_CARE_PROVIDER_SITE_OTHER): Payer: Self-pay

## 2013-08-15 DIAGNOSIS — E538 Deficiency of other specified B group vitamins: Secondary | ICD-10-CM

## 2013-08-15 NOTE — Progress Notes (Signed)
Patient here for B12 injection.  Under aseptic technique Cyanocobalamin given IM in left deltoid.  Tolerated well.  Band-Aid applied. 

## 2013-08-15 NOTE — Patient Instructions (Signed)
Patient will return next month for B12 injection. 

## 2013-09-19 ENCOUNTER — Ambulatory Visit (INDEPENDENT_AMBULATORY_CARE_PROVIDER_SITE_OTHER): Payer: Medicare Other | Admitting: Neurology

## 2013-09-19 DIAGNOSIS — E538 Deficiency of other specified B group vitamins: Secondary | ICD-10-CM

## 2013-09-19 MED ORDER — CYANOCOBALAMIN 1000 MCG/ML IJ SOLN
1000.0000 ug | Freq: Once | INTRAMUSCULAR | Status: AC
  Administered 2013-09-19: 1000 ug via INTRAMUSCULAR

## 2013-09-19 NOTE — Progress Notes (Signed)
Patient here for B12 injection.  Under aseptic technique Cyanocobalamin given IM in right deltoid.  Tolerated well.  Band-Aid applied. 

## 2013-09-19 NOTE — Patient Instructions (Signed)
Patient will return next month for B12 injection. 

## 2013-10-20 ENCOUNTER — Ambulatory Visit (INDEPENDENT_AMBULATORY_CARE_PROVIDER_SITE_OTHER): Payer: Medicare Other | Admitting: Neurology

## 2013-10-20 ENCOUNTER — Telehealth: Payer: Self-pay | Admitting: Neurology

## 2013-10-20 ENCOUNTER — Encounter (INDEPENDENT_AMBULATORY_CARE_PROVIDER_SITE_OTHER): Payer: Self-pay

## 2013-10-20 DIAGNOSIS — E538 Deficiency of other specified B group vitamins: Secondary | ICD-10-CM

## 2013-10-20 MED ORDER — CYANOCOBALAMIN 1000 MCG/ML IJ SOLN
1000.0000 ug | Freq: Once | INTRAMUSCULAR | Status: AC
  Administered 2013-10-20: 1000 ug via INTRAMUSCULAR

## 2013-10-20 NOTE — Patient Instructions (Signed)
Patient may be following up with his PCP for further B12 injections due to change of insurance.

## 2013-10-20 NOTE — Telephone Encounter (Signed)
Spoke with the patient's PCP office, Dr. Clovis Riley, per wife's request.  Since they changed insurance she would like to have his B12 injections done at the PCP office.  Dr. Quita Skye office already received a call from wife and it was approved.  I will call Mrs. Grainger and have her set up next B12 injection at Dr. Ledell Peoples.

## 2013-10-20 NOTE — Progress Notes (Signed)
Patient here for B12 injection.  Under aseptic technique Cyanocobalamin 1000mcg given IM in left deltoid.  Tolerated well.  Band-Aid applied. 

## 2013-10-21 ENCOUNTER — Telehealth: Payer: Self-pay | Admitting: Neurology

## 2013-10-21 NOTE — Telephone Encounter (Signed)
Spoke to patient's wife and relayed that Dr. Quita Skye office has Michael [name] the patient receiving his monthly B12 injections at their office.  She just needs to call and set up an appointment for the injection.

## 2013-11-19 ENCOUNTER — Other Ambulatory Visit: Payer: Self-pay

## 2013-11-19 MED ORDER — OXYBUTYNIN CHLORIDE 5 MG PO TABS: 5.0000 mg | ORAL_TABLET | Freq: Every evening | ORAL | Status: DC

## 2013-11-27 ENCOUNTER — Telehealth: Payer: Self-pay | Admitting: Neurology

## 2013-11-27 NOTE — Telephone Encounter (Signed)
Patient's wife calling to state that patient has a conflicting appointment on 01/05/14 when he is to come and see Dr. Frances FurbishAthar, offered patient's wife next available in January but wife states that is too long to wait. Please return call to wife and advise.

## 2013-11-28 NOTE — Telephone Encounter (Signed)
Called pt and spoke with pt's wife Kathie RhodesBetty to set up an appt on 12/02/13 with Dr. Frances FurbishAthar. Last OV on 08/05/13, stated for pt to come back in 4 mo around 12/05/13. I advised the wife that if the pt has any other problems, questions or concerns to call the office. Wife verbalized understanding.

## 2013-12-02 ENCOUNTER — Ambulatory Visit: Payer: Self-pay | Admitting: Neurology

## 2013-12-04 ENCOUNTER — Encounter (INDEPENDENT_AMBULATORY_CARE_PROVIDER_SITE_OTHER): Payer: Medicare Other | Admitting: Ophthalmology

## 2013-12-04 ENCOUNTER — Encounter: Payer: Self-pay | Admitting: Neurology

## 2013-12-04 ENCOUNTER — Ambulatory Visit (INDEPENDENT_AMBULATORY_CARE_PROVIDER_SITE_OTHER): Payer: Medicare Other | Admitting: Neurology

## 2013-12-04 VITALS — BP 133/65 | HR 56 | Temp 97.6°F | Ht 60.0 in | Wt 149.0 lb

## 2013-12-04 DIAGNOSIS — K117 Disturbances of salivary secretion: Secondary | ICD-10-CM

## 2013-12-04 DIAGNOSIS — E538 Deficiency of other specified B group vitamins: Secondary | ICD-10-CM

## 2013-12-04 DIAGNOSIS — G20 Parkinson's disease: Secondary | ICD-10-CM

## 2013-12-04 DIAGNOSIS — R32 Unspecified urinary incontinence: Secondary | ICD-10-CM

## 2013-12-04 DIAGNOSIS — F0280 Dementia in other diseases classified elsewhere without behavioral disturbance: Secondary | ICD-10-CM

## 2013-12-04 NOTE — Progress Notes (Signed)
Subjective:    Patient ID: Michael Michael [name] is a 80 y.o. male.  HPI    Interim history:  Michael Michael [name] is a very pleasant 80 year old right-handed gentleman with an underlying medical history of vitamin B12 deficiency and chronic back pain, s/p epidural injections in the past, and angioplasty in 1987, prostate surgery and bilateral cataract surgeries, who presents for followup consultation of his right-sided predominant Parkinson's disease, complicated by chronic back pain, dementia, hallucinations and urinary incontinence. He is accompanied by his wife again today. I last saw him on 08/05/2013, at which time we talked about his advanced PD and its complications but overall he seems to be fairly stable. We talked about his medications. He reported feeling well overall. I noted that he was taking 2 different bladder medications. We talked about his sialorrhea and consideration for a myobloc injections for this down the [place]. He was supposed to get new dentures, but they don't have the money.   Today, his wife reports, that when he does not sleep well at times and she may delay his medication accordingly, but tries to give him the C/L 1 hour before a meal. She just turned 82 and they had a surprised party for her! He has fallen, and bruised his L ribcage, no SOB, no CP. He slid off the chair and landed on his buttock.   I saw him on 03/27/2013, at which time I suggested a trial of Ditropan for his urinary incontinence. I kept his other medications the same.  I first met him on 11/25/2012, and which time I did not change his Sinemet dose but increased his Aricept to 10 mg once daily. I suggested a urology referral but they declined. He could not afford the Vesicare and they requested an alternative. She felt, the increase in Aricept helped and he was able to tolerate it.  He previously followed with Dr. Morene [name] and was last seen by him on 07/18/2012, at which time Dr. Erling [name] felt that he most likely has  Parkinson's disease versus Lewy body dementia. He started Aricept 5 mg daily. He felt that we may need to cut down his levodopa.  He has a family history of Parkinson's disease in his sister who passed away at the Michael [name] of 48. His daughter was diagnosed with PD in 3/14 at the Michael [name] of 25. He was diagnosed with Parkinson's disease in May 2008 and was initially placed on Requip but developed hallucinations and was switched to Sinemet in September 2008. His symptoms may date back to 2006. He had postural instability and fell in April 2010 fracturing 2 bones in his right wrist and two bones in his left thumb. He had sudden onset of left arm tingling and weakness and was seen at [facility] but was not felt to be a TPA candidate. CT of the head showed chronic small vessel disease. MRI brain showed normal MRI for Michael [name]. EKG showed first degree AV block. Doppler study of the carotids was normal. Echocardiogram showed an EF of 60-65%. He has had hallucinations and memory loss. In May 2013 his MMSE was 17/30, CDT was 2/4, AFT was 14/minute. In March 2014 his MMSE was 20/30, CDT was 3/4, AFT was 6/minute.   His Past Medical History Is Significant For: Past Medical History  Diagnosis Date  . 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 Unspecified transient cerebral ischemia   . Hallucinations   . Other vitamin B12 deficiency anemia   .  Paralysis agitans   . Parkinson's disease 03/27/2013    His Past Surgical History Is Significant For: Past Surgical History  Procedure Laterality Date  . Cholecystectomy    . Transurethral resection of prostate    . Appendectomy    . Ptca  1987    His Family History Is Significant For: Family History  Problem Relation Michael [name] of Onset  . Parkinson's disease Sister   . Cancer Brother   . Parkinson's disease Daughter 21    His Social History Is Significant For: History   Social History  . Marital Status: Married    Spouse Name: N/A     Number of Children: N/A  . Years of Education: N/A   Social History Main Topics  . Smoking status: Never Smoker   . Smokeless tobacco: None  . Alcohol Use: No  . Drug Use: No  . Sexual Activity: None   Other Topics Concern  . None   Social History Narrative  . None    His Allergies Are:  No Known Allergies:   His Current Medications Are:  Outpatient Encounter Prescriptions as of 12/04/2013  Medication Sig  . aspirin 325 MG tablet Take 325 mg by mouth daily.  . carbidopa-levodopa (SINEMET IR) 25-100 MG per tablet 1 tablets po in the morning and at lunch and 1 tablet po in the evening  . Cholecalciferol (VITAMIN D-3) 1000 UNITS CAPS Take 1 capsule by mouth daily.  Marland Kitchen donepezil (ARICEPT) 10 MG tablet Take 1 tablet (10 mg total) by mouth at bedtime.  . fish oil-omega-3 fatty acids 1000 MG capsule Take 1,200 mg by mouth daily.  . multivitamin-lutein (OCUVITE-LUTEIN) CAPS capsule Take 1 capsule by mouth daily.  Marland Kitchen oxybutynin (DITROPAN) 5 MG tablet Take 1 tablet (5 mg total) by mouth Nightly.  . [DISCONTINUED] hydrocortisone 2.5 % cream   . [DISCONTINUED] VESICARE 10 MG tablet   :  Review of Systems:  Out of a complete 14 point review of systems, all are reviewed and negative with the exception of these symptoms as listed below:  Review of Systems  HENT: Positive for drooling and hearing loss.   Eyes: Negative.   Respiratory: Negative.   Cardiovascular: Negative.   Gastrointestinal: Negative.   Endocrine: Negative.   Genitourinary: Negative.   Musculoskeletal: Positive for back pain and neck stiffness.  Skin: Negative.   Allergic/Immunologic: Negative.   Neurological: Positive for weakness.  Hematological: Negative.   Psychiatric/Behavioral: Positive for hallucinations.    Objective:  Neurologic Exam  Physical Exam Physical Examination:   Filed Vitals:   12/04/13 1548  BP: 133/65  Pulse: 56  Temp: 97.6 F (36.4 C)   General Examination: The patient is a  very pleasant 80 y.o. male in no acute distress.  HEENT: Normocephalic, atraumatic, pupils are equal, round and reactive to light and accommodation. Extraocular tracking shows mild saccadic breakdown without nystagmus noted. There is limitation to upper gaze. There is mild decrease in eye blink rate. Hearing is impaired, but he has no hearing aids. Face is symmetric with moderate facial masking and normal facial sensation. There is no lip, neck or jaw tremor. Neck is moderately rigid with intact passive ROM. There are no carotid bruits on auscultation. Oropharynx exam reveals mild mouth dryness. No significant airway crowding is noted. Mallampati is class II. Tongue protrudes centrally and palate elevates symmetrically. Poor dental hygiene is noted, he has 6 lower teeth, he has dentures in the upper, which fit loosely. There is moderate drooling.   Chest:  is clear to auscultation without wheezing, rhonchi or crackles noted.  Heart: sounds are regular and normal without murmurs, rubs or gallops noted.   Abdomen: is soft, non-tender and non-distended with normal bowel sounds appreciated on auscultation.  Extremities: There is no pitting edema in the distal lower extremities bilaterally. There are no varicose veins. He has mild swelling at his R wrist and decrease in ROM in his R forearm, not new. He is missing the tip of his R index finger.  Skin: is warm and dry with no trophic changes noted. Michael [name]-related changes are noted on the skin.   Musculoskeletal: exam reveals no obvious joint deformities, tenderness, joint swelling or erythema.  Neurologically:  Mental status: The patient is awake and alert, paying fair attention. He is not able to his history very well. His wife provides almost the entire history. He is oriented to: person, place, situation, day of week and month of year. His memory, attention, language and knowledge are impaired. There is no aphasia, agnosia, apraxia or anomia. There is a  moderate degree of bradyphrenia. Speech is moderately hypophonic with mild dysarthria noted. Mood is congruent and affect is normal.   12/04/2013: MMSE was 16/30, AFT 11, CDT 1/4.   Cranial nerves are as described above under HEENT exam. In addition, shoulder shrug is normal with equal shoulder height noted.  Motor exam: Normal bulk, and strength for Michael [name] is noted. There are no dyskinesias noted.   Tone is mildly rigid with presence of cogwheeling in the right upper extremity. There is overall moderate bradykinesia. There is no drift or rebound.  There is no resting tremor. Reflexes are 2+ in the upper extremities and trace in the lower extremities.   Fine motor skills exam: Finger taps are moderately impaired on the right and moderately impaired on the left. Hand movements are mildly impaired on the right and mildly impaired on the left. RAP (rapid alternating patting) is moderately impaired on the right and mildly impaired on the left. Foot taps are moderately impaired on the right and mildly impaired on the left. Foot agility (in the form of heel stomping) is moderately impaired on the right and moderately impaired on the left.    Cerebellar testing shows no dysmetria or intention tremor on finger to nose testing. There is no truncal or gait ataxia.   Sensory exam is intact to light touch, vibration and temperature in the UEs and LEs.   Gait, station and balance: He stands up from the seated position with mild difficulty and does need to push up with His hands. He needs no assistance. No veering to one side is noted. He is not noted to lean to the side. Posture is moderately to severely stooped. Stance is wide-based. He walks with decrease in stride length and pace and decreased arm swing. He turns in 4 steps. Tandem walk is not possible. Balance is mildly to moderately impaired. He is not able to do a toe or heel stance. He has a 4 prong cane.   Assessment and Plan:   In summary, Michael Michael [name] is a  very pleasant 80 year old male with a history of Parkinson's disease, right-sided predominant, complicated by chronic back pain, dementia, hallucinations and urinary incontinence. He has advanced PD, but has motor-wise remained fairly stable. His memory numbers have taken a decline. I again discussed the diagnosis of advanced Parksinson's disease and its complications, its prognosis and treatment options. We talked about medical treatments and non-pharmacological approaches. We talked about maintaining a  healthy lifestyle in general. I encouraged the patient to eat healthy, exercise daily and keep well hydrated, to keep a scheduled bedtime and wake time routine, to not skip any meals and eat healthy snacks in between meals and to have protein with every meal. In particular, I stressed the importance of regular exercise, within of course the patient's own mobility limitations. I would like for him to use his cane at all times and he can use his stationary bike.   As far as further diagnostic testing is concerned, I suggested: no change other than above. He is advised to continue with Sinemet, Aricept and Ditropan. I would like to see the patient back in 4 months, sooner if the need arises and encouraged them to call with any interim questions, concerns, problems or updates and refill request. For his sialorrhea, we can consider myobloc injections down the road, but I think, we should hold off until he gets his new dentures and has acclimatized to them. They were in agreement.

## 2013-12-04 NOTE — Patient Instructions (Signed)
We will continue your memory medicine, your bladder medicine and your parkinson's medicine the same.

## 2013-12-09 ENCOUNTER — Other Ambulatory Visit: Payer: Self-pay

## 2013-12-09 DIAGNOSIS — G20 Parkinson's disease: Secondary | ICD-10-CM

## 2013-12-09 MED ORDER — CARBIDOPA-LEVODOPA 25-100 MG PO TABS: ORAL_TABLET | ORAL | Status: DC

## 2013-12-18 NOTE — Telephone Encounter (Signed)
Noted  

## 2014-01-05 ENCOUNTER — Ambulatory Visit: Payer: Medicare Other | Admitting: Neurology

## 2014-02-23 ENCOUNTER — Other Ambulatory Visit: Payer: Self-pay

## 2014-02-23 MED ORDER — OXYBUTYNIN CHLORIDE 5 MG PO TABS: 5.0000 mg | ORAL_TABLET | Freq: Every evening | ORAL | Status: DC

## 2014-03-25 ENCOUNTER — Telehealth: Payer: Self-pay | Admitting: *Deleted

## 2014-03-25 ENCOUNTER — Encounter: Payer: Self-pay | Admitting: *Deleted

## 2014-03-25 NOTE — Telephone Encounter (Signed)
Called patient to resched apt. Lft VM with new apt date and time. Requested that he calls the office for assistance if the apt does not fit his schedule.

## 2014-04-07 ENCOUNTER — Other Ambulatory Visit: Payer: Self-pay

## 2014-04-07 DIAGNOSIS — F0390 Unspecified dementia without behavioral disturbance: Secondary | ICD-10-CM

## 2014-04-07 DIAGNOSIS — G20 Parkinson's disease: Secondary | ICD-10-CM

## 2014-04-07 MED ORDER — DONEPEZIL HCL 10 MG PO TABS: 10.0000 mg | ORAL_TABLET | Freq: Every day | ORAL | Status: DC

## 2014-05-07 ENCOUNTER — Ambulatory Visit: Payer: Medicare Other | Admitting: Neurology

## 2014-05-14 ENCOUNTER — Ambulatory Visit: Payer: Medicare Other | Admitting: Neurology

## 2014-05-22 ENCOUNTER — Other Ambulatory Visit: Payer: Self-pay

## 2014-05-22 MED ORDER — OXYBUTYNIN CHLORIDE 5 MG PO TABS: 5.0000 mg | ORAL_TABLET | Freq: Every evening | ORAL | Status: DC

## 2014-06-04 ENCOUNTER — Ambulatory Visit: Payer: Medicare Other | Admitting: Neurology

## 2014-07-07 ENCOUNTER — Other Ambulatory Visit: Payer: Self-pay

## 2014-07-07 ENCOUNTER — Telehealth: Payer: Self-pay | Admitting: Neurology

## 2014-07-07 DIAGNOSIS — F0390 Unspecified dementia without behavioral disturbance: Secondary | ICD-10-CM

## 2014-07-07 DIAGNOSIS — G20 Parkinson's disease: Secondary | ICD-10-CM

## 2014-07-07 MED ORDER — OXYBUTYNIN CHLORIDE 5 MG PO TABS: 5.0000 mg | ORAL_TABLET | Freq: Every evening | ORAL | Status: DC

## 2014-07-07 MED ORDER — DONEPEZIL HCL 10 MG PO TABS: 10.0000 mg | ORAL_TABLET | Freq: Every day | ORAL | Status: DC

## 2014-07-07 NOTE — Telephone Encounter (Signed)
Rx has been sent.  Pharmacy is aware.  (Patient has appt scheduled.)

## 2014-07-07 NOTE — Telephone Encounter (Signed)
Pharmacist, [facility]athy [facility] @ [phone], requesting Rx refill for donepezil (ARICEPT) 10 MG tablet.  Please call and advise

## 2014-07-16 ENCOUNTER — Other Ambulatory Visit: Payer: Self-pay

## 2014-07-16 DIAGNOSIS — G20 Parkinson's disease: Secondary | ICD-10-CM

## 2014-07-16 MED ORDER — CARBIDOPA-LEVODOPA 25-100 MG PO TABS: ORAL_TABLET | ORAL | Status: DC

## 2014-08-13 ENCOUNTER — Ambulatory Visit: Payer: Self-pay | Admitting: Neurology

## 2014-08-27 ENCOUNTER — Ambulatory Visit (INDEPENDENT_AMBULATORY_CARE_PROVIDER_SITE_OTHER): Payer: Medicare Other | Admitting: Neurology

## 2014-08-27 ENCOUNTER — Telehealth: Payer: Self-pay | Admitting: Neurology

## 2014-08-27 ENCOUNTER — Encounter: Payer: Self-pay | Admitting: Neurology

## 2014-08-27 VITALS — BP 117/68 | HR 54 | Resp 16

## 2014-08-27 DIAGNOSIS — G20 Parkinson's disease: Secondary | ICD-10-CM

## 2014-08-27 DIAGNOSIS — R269 Unspecified abnormalities of gait and mobility: Secondary | ICD-10-CM | POA: Diagnosis not present

## 2014-08-27 DIAGNOSIS — F0390 Unspecified dementia without behavioral disturbance: Secondary | ICD-10-CM

## 2014-08-27 DIAGNOSIS — F0280 Dementia in other diseases classified elsewhere without behavioral disturbance: Secondary | ICD-10-CM | POA: Diagnosis not present

## 2014-08-27 DIAGNOSIS — Z9181 History of falling: Secondary | ICD-10-CM

## 2014-08-27 DIAGNOSIS — K117 Disturbances of salivary secretion: Secondary | ICD-10-CM

## 2014-08-27 MED ORDER — CARBIDOPA-LEVODOPA 25-100 MG PO TABS: ORAL_TABLET | ORAL | Status: DC

## 2014-08-27 MED ORDER — DONEPEZIL HCL 10 MG PO TABS: 10.0000 mg | ORAL_TABLET | Freq: Every day | ORAL | Status: DC

## 2014-08-27 MED ORDER — OXYBUTYNIN CHLORIDE 5 MG PO TABS: 5.0000 mg | ORAL_TABLET | Freq: Every evening | ORAL | Status: DC

## 2014-08-27 NOTE — Progress Notes (Signed)
Subjective:    Patient ID: Michael [name] is a 81 y.o. male.  HPI     Interim history:   [name] is a very pleasant 81 year old right-handed gentleman with an underlying medical history of vitamin B12 deficiency and chronic back pain, s/p epidural injections in the past, and angioplasty in 1987, prostate surgery and bilateral cataract surgeries, who presents for followup consultation of his right-sided predominant Parkinson's disease, complicated by chronic back pain, dementia, hallucinations and urinary incontinence. He is accompanied by his wife again today. I last saw him on 12/04/2013, at which time his wife reported a recent fall where he bruised his left rib cage. No other injuries were reported. He slid off his chair and landed on his buttock. His MMSE was 16/30 in July 2015. I did not make any medication changes and kept him on Sinemet, Aricept and ditropan.   Today, 08/27/2014: He is able to provide some of his own history. Most of his history is provided by his wife. No recent falls are reported, he has good days and bad days. Their granddaughter lives very close and they all help out, including her son, her granddaughter and great-grand kids. He quit driving some years ago. He has more drooling. He has gotten his dentures and there is still some soreness. He uses them, but does eat a lot of softer foods. Their daughter is in [place]. Their other son died at 3 in a car accident. He has some constipation. He drinks water with meals. He has to wear depends. He has a walker at home, but it is actually hers. He has occasional hallucinations.   Previously: I saw him on 08/05/2013, at which time we talked about his advanced PD and its complications but overall he seems to be fairly stable. We talked about his medications. He reported feeling well overall. I noted that he was taking 2 different bladder medications. We talked about his sialorrhea and consideration for a myobloc injections for this  down the [place]. He was supposed to get new dentures, but they don't have the money.    I saw him on 03/27/2013, at which time I suggested a trial of Ditropan for his urinary incontinence. I kept his other medications the same.    I first met him on 11/25/2012, and which time I did not change his Sinemet dose but increased his Aricept to 10 mg once daily. I suggested a urology referral but they declined. He could not afford the Vesicare and they requested an alternative. She felt, the increase in Aricept helped and he was able to tolerate it.   He previously followed with Dr. Morene Antu and was last seen by him on 07/18/2012, at which time Dr. Erling Cruz felt that he most likely has Parkinson's disease versus Lewy body dementia. He started Aricept 5 mg daily. He felt that we may need to cut down his levodopa.    He has a family history of Parkinson's disease in his sister who passed away at the age of 60. His daughter was diagnosed with PD in 3/14 at the age of 28. He was diagnosed with Parkinson's disease in May 2008 and was initially placed on Requip but developed hallucinations and was switched to Sinemet in September 2008. His symptoms may date back to 2006. He had postural instability and fell in April 2010 fracturing 2 bones in his right wrist and two bones in his left thumb. He had sudden onset of left arm tingling and weakness and was  seen at [facility] but was not felt to be a TPA candidate. CT of the head showed chronic small vessel disease. MRI brain showed normal MRI for age. EKG showed first degree AV block. Doppler study of the carotids was normal. Echocardiogram showed an EF of 60-65%. He has had hallucinations and memory loss. In May 2013 his MMSE was 17/30, CDT was 2/4, AFT was 14/minute. In March 2014 his MMSE was 20/30, CDT was 3/4, AFT was 6/minute.   His Past Medical History Is Significant For: Past Medical History  Diagnosis Date  . 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 Unspecified transient cerebral ischemia   . Hallucinations   . Other vitamin B12 deficiency anemia   . Paralysis agitans   . Parkinson's disease 03/27/2013    His Past Surgical History Is Significant For: Past Surgical History  Procedure Laterality Date  . Cholecystectomy    . Transurethral resection of prostate    . Appendectomy    . Ptca  1987    His Family History Is Significant For: Family History  Problem Relation Age of Onset  . Parkinson's disease Sister   . Cancer Brother   . Parkinson's disease Daughter 13    His Social History Is Significant For: History   Social History  . Marital Status: Married    Spouse Name: N/A  . Number of Children: N/A  . Years of Education: N/A   Social History Main Topics  . Smoking status: Never Smoker   . Smokeless tobacco: Not on file  . Alcohol Use: No  . Drug Use: No  . Sexual Activity: Not on file   Other Topics Concern  . None   Social History Narrative    His Allergies Are:  No Known Allergies:   His Current Medications Are:  Outpatient Encounter Prescriptions as of 08/27/2014  Medication Sig  . aspirin 325 MG tablet Take 325 mg by mouth daily.  . carbidopa-levodopa (SINEMET IR) 25-100 MG per tablet 1.5 tablets po in the morning and at lunch and 1 tablet po in the evening  . Cholecalciferol (VITAMIN D-3) 1000 UNITS CAPS Take 1 capsule by mouth daily.  Marland Kitchen donepezil (ARICEPT) 10 MG tablet Take 1 tablet (10 mg total) by mouth at bedtime.  . fish oil-omega-3 fatty acids 1000 MG capsule Take 1,200 mg by mouth daily.  . multivitamin-lutein (OCUVITE-LUTEIN) CAPS capsule Take 1 capsule by mouth daily.  Marland Kitchen oxybutynin (DITROPAN) 5 MG tablet Take 1 tablet (5 mg total) by mouth Nightly.  :  Review of Systems:  Out of a complete 14 point review of systems, all are reviewed and negative with the exception of these symptoms as listed below:   Review of Systems  Neurological:        Drooling     Objective:  Neurologic Exam  Physical Exam Physical Examination:   Filed Vitals:   08/27/14 1408  BP: 117/68  Pulse: 54  Resp: 16   General Examination: The patient is a very pleasant 81 y.o. male in no acute distress. He is leaning to the left in his chair.  HEENT: Normocephalic, atraumatic, pupils are equal, round and reactive to light and accommodation. Extraocular tracking shows mild saccadic breakdown without nystagmus noted. There is limitation to upper gaze. There is mild decrease in eye blink rate. Hearing is impaired, but he has no hearing aids. Face is symmetric with moderate facial masking and normal facial sensation. There is no lip, neck  or jaw tremor. Neck is moderately rigid with intact passive ROM. There are no carotid bruits on auscultation. Oropharynx exam reveals mild mouth dryness. No significant airway crowding is noted. Mallampati is class II. Tongue protrudes centrally and palate elevates symmetrically. He has new dentures. There is moderate drooling.   Chest: is clear to auscultation without wheezing, rhonchi or crackles noted.  Heart: sounds are regular and normal without murmurs, rubs or gallops noted.   Abdomen: is soft, non-tender and non-distended with normal bowel sounds appreciated on auscultation.  Extremities: There is no pitting edema in the distal lower extremities bilaterally. There are no varicose veins. He has mild swelling at his R wrist and decrease in ROM in his R forearm, not new. He is missing the tip of his R index finger.  Skin: is warm and dry with no trophic changes noted. Age-related changes are noted on the skin.   Musculoskeletal: exam reveals no obvious joint deformities, tenderness, joint swelling or erythema.  Neurologically:  Mental status: The patient is awake and alert, paying less attention today and appears a little sleepy today. His wife provides almost the entire history. He is oriented to: person, place,  situation. His memory, attention, language and knowledge are impaired. There is no aphasia, agnosia, apraxia or anomia. There is a moderate degree of bradyphrenia. Speech is moderately to severely hypophonic with mild dysarthria noted. Mood is congruent and affect seems flat.    On 12/04/2013: MMSE was 16/30, AFT 11, CDT 1/4.   On 08/27/2014: MMSE: 12/30, CDT: 1/4, AFT: 11/min, GDS: 4/15.  Cranial nerves are as described above under HEENT exam. In addition, shoulder shrug is normal with unequal shoulder height noted.  Motor exam: Normal bulk, and mild global weakness in keeping with mild deconditioning is noted. He seems to have mild left lower extremity intermittent dyskinesias today.    Tone is mildly rigid with presence of cogwheeling in the right upper extremity. There is overall moderate bradykinesia. There is no drift or rebound.  There is no resting tremor. Reflexes are 2+ in the upper extremities and trace in the lower extremities.   Fine motor skills exam: Finger taps are moderately impaired on the right and moderately impaired on the left. Hand movements are mildly impaired on the right and mildly impaired on the left. RAP (rapid alternating patting) is moderately impaired on the right and mildly impaired on the left. Foot taps are moderately impaired on the right and mildly impaired on the left. Foot agility (in the form of heel stomping) is moderately impaired on the right and moderately impaired on the left.    Cerebellar testing shows no dysmetria or intention tremor on finger to nose testing. There is no truncal or gait ataxia.   Sensory exam is intact to light touch, vibration and temperature in the UEs and LEs.   Gait, station and balance: He stands up from the seated position with  moderate difficulty and has to push himself up. His posture severely stooped. Stance is wide-based. He walks with significant decrease in stride length and pace. He has a 4 prong cane. He is having  difficulty with turning and balance is moderately impaired.  he does not appear to be fully safe with just using a cane at this point.   Assessment and Plan:   In summary, Michael [name] is a very pleasant 81 year old male with a history of Parkinson's disease, right-sided predominant, complicated by chronic back pain, dementia, hallucinations, significant posture changes, significant sialorrhea, urinary  incontinence, and urinary incontinence. He has advanced PD, and has progressed motor-wise and also his memory scores have taken a decline. Unfortunately, he has also overall become more frail. For the drooling with talked about Myobloc injections in the past but I would shy away from pursuing these because first of all there is not 100% guarantee for an improvement in the drooling and second of all he can have potential side effects including swallowing difficulties.  His balance and posture are worse today as well. I would like for him to start using a walker. I prescribed a 4 wheeled walker with seat. We will send this to [facility] [employer], local DME company. His wife is in agreement. I suggested continuation of the same medications at this time. I am also reluctant to suggest a new memory medication for fear of side effects. I've asked him to continue to eat healthy and drink plenty of water, about 6 glasses a day if possible. He is also still adjusting to his new dentures according to his wife. I suggested a 4-6 month follow-up, sooner if the need arises. Denies symptoms call with any interim questions or concerns. They have good family network and their children and her grandchildren and even great-grandchildren help out and check on them, help with laundry and cooking and groceries. His wife drives. He does not drive. I spent 25 minutes in total face-to-face time with the patient, more than 50% of which was spent in counseling and coordination of care, reviewing test results, reviewing medication and  discussing or reviewing the diagnosis of PD in the advanced stage and with advancing age, the prognosis and treatment options.

## 2014-08-27 NOTE — Telephone Encounter (Signed)
[name] [facility] @ [phone] x [phone], requesting recent weight and height.  Please call and advise.

## 2014-08-27 NOTE — Patient Instructions (Addendum)
We will try to get you a 4 wheeled walker with seat.   We will continue with the medications.   Please try to drink about 6 glasses of water throughout the day.

## 2014-08-27 NOTE — Telephone Encounter (Signed)
I spoke to Michael [name] and gave him patient's current height and weight.

## 2014-11-29 ENCOUNTER — Other Ambulatory Visit: Payer: Self-pay

## 2014-11-29 DIAGNOSIS — G20 Parkinson's disease: Secondary | ICD-10-CM

## 2014-11-29 DIAGNOSIS — F0390 Unspecified dementia without behavioral disturbance: Secondary | ICD-10-CM

## 2014-11-29 MED ORDER — OXYBUTYNIN CHLORIDE 5 MG PO TABS: 5.0000 mg | ORAL_TABLET | Freq: Every evening | ORAL | Status: DC

## 2014-11-29 MED ORDER — DONEPEZIL HCL 10 MG PO TABS: 10.0000 mg | ORAL_TABLET | Freq: Every day | ORAL | Status: DC

## 2014-11-29 MED ORDER — CARBIDOPA-LEVODOPA 25-100 MG PO TABS: ORAL_TABLET | ORAL | Status: DC

## 2015-03-01 ENCOUNTER — Ambulatory Visit (INDEPENDENT_AMBULATORY_CARE_PROVIDER_SITE_OTHER): Payer: Medicare Other | Admitting: Adult Health

## 2015-03-01 ENCOUNTER — Ambulatory Visit: Payer: Self-pay | Admitting: Neurology

## 2015-03-01 ENCOUNTER — Encounter: Payer: Self-pay | Admitting: Adult Health

## 2015-03-01 VITALS — BP 143/63 | HR 57 | Ht 60.0 in | Wt 157.0 lb

## 2015-03-01 DIAGNOSIS — F0390 Unspecified dementia without behavioral disturbance: Secondary | ICD-10-CM | POA: Diagnosis not present

## 2015-03-01 DIAGNOSIS — G20 Parkinson's disease: Secondary | ICD-10-CM

## 2015-03-01 DIAGNOSIS — R269 Unspecified abnormalities of gait and mobility: Secondary | ICD-10-CM | POA: Diagnosis not present

## 2015-03-01 NOTE — Progress Notes (Addendum)
PATIENT: Michael [name] DOB: August 11, 1927  REASON FOR VISIT: follow up- memory, parkinson's disease HISTORY FROM: patient  HISTORY OF PRESENT ILLNESS: Michael [name] is an 81 year old male with a history of memory disorder as well as Parkinson's disease. He returns today for follow-up. The patient and his wife feel that his memory has remained the same. He is able to complete most ADLs with minimal assistance. His wife states that he needs help dressing himself but he can't typically bathe and feed himself independently. He denies any trouble with swallowing. At the last visit it was recommended that the patient get a walker. The wife states that he did obtain the walker however he does not use it as much as he uses the cane. Patient states that he typically sleeps well at night. Wife states that he often sleeps during the day in his chair. She states that they have a lift chair at home that helps him get in and out of the chair. She reports that they also have a lot of family that stopping frequently to help them with daily chores. Patient and family deny any new neurological symptoms. He returns today for an evaluation.  HISTORY 08/27/14 (ATHAR): 08/27/2014: He is able to provide  some of his own history. Most of his history is provided by his wife. No recent falls are reported, he has good days and bad days. Their granddaughter lives very close and they all help out, including her son, her granddaughter and great-grand kids. He quit driving some years ago. He has more drooling. He has gotten his dentures and there is still some soreness. He uses them, but does eat a lot of softer foods. Their daughter is in [place]. Their other son died at 68 in a car accident. He has some constipation. He drinks water with meals. He has to wear depends. He has a walker at home, but it is actually hers. He has occasional hallucinations.   I saw him on 08/05/2013, at which time we talked about his advanced PD and its  complications but overall he seems to be fairly stable. We talked about his medications. He reported feeling well overall. I noted that he was taking 2 different bladder medications. We talked about his sialorrhea and consideration for a myobloc injections for this down the [place]. He was supposed to get new dentures, but they don't have the money.   I saw him on 03/27/2013, at which time I suggested a trial of Ditropan for his urinary incontinence. I kept his other medications the same.   I first met him on 11/25/2012, and which time I did not change his Sinemet dose but increased his Aricept to 10 mg once daily. I suggested a urology referral but they declined. He could not afford the Vesicare and they requested an alternative. She felt, the increase in Aricept helped and he was able to tolerate it.  He previously followed with Dr. Morene Antu and was last seen by him on 07/18/2012, at which time Dr. Erling Cruz felt that he most likely has Parkinson's disease versus Lewy body dementia. He started Aricept 5 mg daily. He felt that we may need to cut down his levodopa.   He has a family history of Parkinson's disease in his sister who passed away at the age of 25. His daughter was diagnosed with PD in 3/14 at the age of 70. He was diagnosed with Parkinson's disease in May 2008 and was initially placed on Requip but developed hallucinations  and was switched to Sinemet in September 2008. His symptoms may date back to 2006. He had postural instability and fell in April 2010 fracturing 2 bones in his right wrist and two bones in his left thumb. He had sudden onset of left arm tingling and weakness and was seen at [facility] but was not felt to be a TPA candidate. CT of the head showed chronic small vessel disease. MRI brain showed normal MRI for age. EKG showed first degree AV block. Doppler study of the carotids was normal. Echocardiogram showed an EF of 60-65%. He has had hallucinations and memory loss. In  May 2013 his MMSE was 17/30, CDT was 2/4, AFT was 14/minute. In March 2014 his MMSE was 20/30, CDT was 3/4, AFT was 6/minute.     REVIEW OF SYSTEMS: Out of a complete 14 system review of symptoms, the patient complains only of the following symptoms, and all other reviewed systems are negative.  ALLERGIES: No Known Allergies  HOME MEDICATIONS: Outpatient Prescriptions Prior to Visit  Medication Sig Dispense Refill  . aspirin 325 MG tablet Take 325 mg by mouth daily.    . carbidopa-levodopa (SINEMET IR) 25-100 MG per tablet 1.5 tablets po in the morning and at lunch and 1 tablet po in the evening 360 tablet 0  . Cholecalciferol (VITAMIN D-3) 1000 UNITS CAPS Take 1 capsule by mouth daily.    Marland Kitchen donepezil (ARICEPT) 10 MG tablet Take 1 tablet (10 mg total) by mouth at bedtime. 90 tablet 0  . fish oil-omega-3 fatty acids 1000 MG capsule Take 1,200 mg by mouth daily.    . multivitamin-lutein (OCUVITE-LUTEIN) CAPS capsule Take 1 capsule by mouth daily.    Marland Kitchen oxybutynin (DITROPAN) 5 MG tablet Take 1 tablet (5 mg total) by mouth Nightly. 90 tablet 0   No facility-administered medications prior to visit.    PAST MEDICAL HISTORY: Past Medical History  Diagnosis Date  . 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 Unspecified transient cerebral ischemia   . Hallucinations   . Other vitamin B12 deficiency anemia   . Paralysis agitans ([place])   . Parkinson's disease ([place]) 03/27/2013    PAST SURGICAL HISTORY: Past Surgical History  Procedure Laterality Date  . Cholecystectomy    . Transurethral resection of prostate    . Appendectomy    . Ptca  1987    FAMILY HISTORY: Family History  Problem Relation Age of Onset  . Parkinson's disease Sister   . Cancer Brother   . Parkinson's disease Daughter 35    SOCIAL HISTORY: Social History   Social History  . Marital Status: Married    Spouse Name: N/A  . Number of Children: N/A  . Years of Education: N/A     Occupational History  . Not on file.   Social History Main Topics  . Smoking status: Never Smoker   . Smokeless tobacco: Not on file  . Alcohol Use: No  . Drug Use: No  . Sexual Activity: Not on file   Other Topics Concern  . Not on file   Social History Narrative      PHYSICAL EXAM  Filed Vitals:   03/01/15 1446  BP: 143/63  Pulse: 57  Height: 5' (1.524 m)  Weight: 157 lb (71.215 kg)   Body mass index is 30.66 kg/(m^2).  MMSE - Mini Mental State Exam 03/01/2015 08/27/2014 12/04/2013  Orientation to time _0 Orientation to Place 4 3 4  Registration _0 Attention/ Calculation 0 0 0  Recall 0 0 0  Language- name 2 objects _1 Language- repeat 1 0 1  Language- follow 3 step command _2 Language- read & follow direction _3 Write a sentence 0 0 1  Copy design 0 0 0  Total score _4 Generalized: Well developed, in no acute distress   Neurological examination  Mentation: Alert. Follows all commands speech and language fluent. Masking of face Cranial nerve II-XII: Pupils were equal round reactive to light. Extraocular movements were full, visual field were full on confrontational test. Facial sensation and strength were normal. Uvula tongue midline. Head turning and shoulder shrug  were normal and symmetric. Motor: The motor testing reveals 5 over 5 strength of all 4 extremities. Mild rigidity and cogwheeling in the upper extremities right greater than left. No resting tremor noted. Sensory: Sensory testing is intact to soft touch on all 4 extremities. No evidence of extinction is noted.  Coordination: Cerebellar testing reveals good finger-nose-finger but difficulty with heel-to-shin bilaterally.  Gait and station: Patient requires assistance when going from a sitting to a standing position. Uses a cane when in relating. Patient has a very stooped posture with shuffling gait.  Reflexes: Deep tendon reflexes are symmetric and normal in the upper  extremities decreased in the lower extremities.  DIAGNOSTIC DATA (LABS, IMAGING, TESTING) - I reviewed patient records, labs, notes, testing and imaging myself where available.  ASSESSMENT AND PLAN 81 y.o. year old male  has a past medical history of Other persistent mental disorders due to conditions classified elsewhere; Unspecified transient cerebral ischemia; Hallucinations; Other vitamin B12 deficiency anemia; Paralysis agitans ([place]); and Parkinson's disease ([place]) (03/27/2013). here with:  1. Memory disorder 2. Parkinson's disease 3. Abnormality of gait  Overall the patient has remained stable. His MMSE today is 15/30 with recently 13/30. He will continue on Aricept at this time. The patient will continue to take Sinemet. At this time I do not feel that we need to adjust his dose. He is encouraged to use his rolling walker instead of his cane. Patient and wife verbalized understanding. Patient advised that if his symptoms worsen or he develops any new symptoms he should let us know. He will follow-up in 6 months with Dr. [name].   Ward Givens, MSN, NP-C 03/01/2015, 3:22 PM [facility] [address], [place], [place] [address] [phone]   I reviewed the above note and documentation by the Nurse Practitioner and agree with the history, physical exam, assessment and plan as outlined above. I was immediately available for face-to-face consultation. Star Age, MD, PhD [facility])

## 2015-03-01 NOTE — Patient Instructions (Signed)
Continue Sinemet and Aricept We will continue monitor your memory If your symptoms worsen or you develop new symptoms please let us know.

## 2015-03-04 ENCOUNTER — Other Ambulatory Visit: Payer: Self-pay | Admitting: Neurology

## 2015-06-14 ENCOUNTER — Telehealth: Payer: Self-pay | Admitting: Adult Health

## 2015-06-14 NOTE — Telephone Encounter (Signed)
Spoke to son Onalee Hua. Offered appt. Onalee Hua said he will get with his mother to schedule an appt for patient. Will call back.

## 2015-06-14 NOTE — Telephone Encounter (Signed)
Pt's son called said his mother is struggling with bathing pt and exercising him. He said pt's balance and gait seems to be a little worse. He said pt fell last Wednesday 06/09/15. He said he didn't know if he was trying to get on the bed or out of the bed but pt fell on the floor. He is also sleeping a lot, he kept falling asleep while eating breakfast on Saturday morning 06/12/15. Son said his parents funds were limited but he has has been told that church friends that would help, and was told hospice could help. He said they cannot afford an agency.

## 2015-06-14 NOTE — Telephone Encounter (Signed)
Can the patient come in for a revisit. We can discuss potential options then.

## 2015-06-16 ENCOUNTER — Encounter: Payer: Self-pay | Admitting: Adult Health

## 2015-06-16 ENCOUNTER — Ambulatory Visit (INDEPENDENT_AMBULATORY_CARE_PROVIDER_SITE_OTHER): Payer: Medicare Other | Admitting: Adult Health

## 2015-06-16 VITALS — BP 122/72 | HR 62 | Ht 60.0 in | Wt 161.5 lb

## 2015-06-16 DIAGNOSIS — R269 Unspecified abnormalities of gait and mobility: Secondary | ICD-10-CM

## 2015-06-16 DIAGNOSIS — G20 Parkinson's disease: Secondary | ICD-10-CM | POA: Diagnosis not present

## 2015-06-16 MED ORDER — CARBIDOPA-LEVODOPA 25-100 MG PO TABS: ORAL_TABLET | ORAL | Status: DC

## 2015-06-16 NOTE — Progress Notes (Addendum)
PATIENT: Michael [name] DOB: 05/12/1928  REASON FOR VISIT: follow up HISTORY FROM: patient  HISTORY OF PRESENT ILLNESS: Michael [name] is an 82 year old male with a history of Parkinson's disease. He returns today for follow-up. He currently takes Sinemet 25-100 milligrams one and a half tablets in the morning and at lunch and 1 tablet in the evening. The patient's wife states for the most part things have remained stable. She has noted that he is having progressive left-sided weakness. She states that this was not a sudden change her rather progressive. Denies any changes with his speech.She states that he tends to lean to the left when he is sitting. The patient feels that his tremor has remained the same. He may get slightly worse in the evenings. Patient continues to use a rolling walker or cane when ambulating. He denies any recent falls. Patient denies any trouble with his swallowing. He reports that he has a good appetite. The patient's memory has remained remained stable according to the wife.the patient requires assistance with ADLs due to his Parkinson's. The patient is able to feed himself. Patient states that he sleeps well at night. Overall they feel that his symptoms may have gotten slightly worse and they notice it more in the evenings.he returns today for an evaluation  HISTORY  03/01/15: Michael [name] is an 82 year old male with a history of memory disorder as well as Parkinson's disease. He returns today for follow-up. The patient and his wife feel that his memory has remained the same. He is able to complete most ADLs with minimal assistance. His wife states that he needs help dressing himself but he can't typically bathe and feed himself independently. He denies any trouble with swallowing. At the last visit it was recommended that the patient get a walker. The wife states that he did obtain the walker however he does not use it as much as he uses the cane. Patient states that he  typically sleeps well at night. Wife states that he often sleeps during the day in his chair. She states that they have a lift chair at home that helps him get in and out of the chair. She reports that they also have a lot of family that stopping frequently to help them with daily chores. Patient and family deny any new neurological symptoms. He returns today for an evaluation.  HISTORY  08/27/14 (ATHAR): 08/27/2014: He is able to provide some of his own history. Most of his history is provided by his wife. No recent falls are reported, he has good days and bad days. Their granddaughter lives very close and they all help out, including her son, her granddaughter and great-grand kids. He quit driving some years ago. He has more drooling. He has gotten his dentures and there is still some soreness. He uses them, but does eat a lot of softer foods. Their daughter is in [place]. Their other son died at 29 in a car accident. He has some constipation. He drinks water with meals. He has to wear depends. He has a walker at home, but it is actually hers. He has occasional hallucinations.   I saw him on 08/05/2013, at which time we talked about his advanced PD and its complications but overall he seems to be fairly stable. We talked about his medications. He reported feeling well overall. I noted that he was taking 2 different bladder medications. We talked about his sialorrhea and consideration for a myobloc injections for this down the [place].  He was supposed to get new dentures, but they don't have the money.   I saw him on 03/27/2013, at which time I suggested a trial of Ditropan for his urinary incontinence. I kept his other medications the same.   I first met him on 11/25/2012, and which time I did not change his Sinemet dose but increased his Aricept to 10 mg once daily. I suggested a urology referral but they declined. He could not afford the Vesicare and they requested an alternative. She felt, the increase  in Aricept helped and he was able to tolerate it.  He previously followed with Dr. Morene Antu and was last seen by him on 07/18/2012, at which time Dr. Erling Cruz felt that he most likely has Parkinson's disease versus Lewy body dementia. He started Aricept 5 mg daily. He felt that we may need to cut down his levodopa.   He has a family history of Parkinson's disease in his sister who passed away at the age of 56. His daughter was diagnosed with PD in 3/14 at the age of 76. He was diagnosed with Parkinson's disease in May 2008 and was initially placed on Requip but developed hallucinations and was switched to Sinemet in September 2008. His symptoms may date back to 2006. He had postural instability and fell in April 2010 fracturing 2 bones in his right wrist and two bones in his left thumb. He had sudden onset of left arm tingling and weakness and was seen at [facility] but was not felt to be a TPA candidate. CT of the head showed chronic small vessel disease. MRI brain showed normal MRI for age. EKG showed first degree AV block. Doppler study of the carotids was normal. Echocardiogram showed an EF of 60-65%. He has had hallucinations and memory loss. In May 2013 his MMSE was 17/30, CDT was 2/4, AFT was 14/minute. In March 2014 his MMSE was 20/30, CDT was 3/4, AFT was 6/minute.   REVIEW OF SYSTEMS: Out of a complete 14 system review of symptoms, the patient complains only of the following symptoms, and all other reviewed systems are negative.  ALLERGIES: No Known Allergies  HOME MEDICATIONS: Outpatient Prescriptions Prior to Visit  Medication Sig Dispense Refill  . acetaminophen (TYLENOL) 500 MG tablet Take 500 mg by mouth every 6 (six) hours as needed.    Marland Kitchen aspirin 325 MG tablet Take 325 mg by mouth daily.    . Cholecalciferol (VITAMIN D-3) 1000 UNITS CAPS Take 1 capsule by mouth daily.    Marland Kitchen donepezil (ARICEPT) 10 MG tablet Take 1 tablet by mouth at  bedtime 90 tablet 1  . fish oil-omega-3  fatty acids 1000 MG capsule Take 1,200 mg by mouth daily.    . multivitamin-lutein (OCUVITE-LUTEIN) CAPS capsule Take 1 capsule by mouth daily.    Marland Kitchen oxybutynin (DITROPAN) 5 MG tablet Take 1 tablet by mouth  nightly 90 tablet 1  . carbidopa-levodopa (SINEMET IR) 25-100 MG tablet Take 1 and 1/2 tablets by  mouth in the morning and at lunch, and 1 tablet in the  evening 360 tablet 1   No facility-administered medications prior to visit.    PAST MEDICAL HISTORY: Past Medical History  Diagnosis Date  . 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 Unspecified transient cerebral ischemia   . Hallucinations   . Other vitamin B12 deficiency anemia   . Paralysis agitans ([place])   . Parkinson's disease ([place]) 03/27/2013    PAST SURGICAL HISTORY:  Past Surgical History  Procedure Laterality Date  . Cholecystectomy    . Transurethral resection of prostate    . Appendectomy    . Ptca  1987    FAMILY HISTORY: Family History  Problem Relation Age of Onset  . Parkinson's disease Sister   . Cancer Brother   . Parkinson's disease Daughter 27    SOCIAL HISTORY: Social History   Social History  . Marital Status: Married    Spouse Name: N/A  . Number of Children: N/A  . Years of Education: N/A   Occupational History  . Not on file.   Social History Main Topics  . Smoking status: Never Smoker   . Smokeless tobacco: Not on file  . Alcohol Use: No  . Drug Use: No  . Sexual Activity: Not on file   Other Topics Concern  . Not on file   Social History Narrative      PHYSICAL EXAM  Filed Vitals:   06/16/15 1047  BP: 122/72  Pulse: 62  Height: 5' (1.524 m)  Weight: 161 lb 8 oz (73.256 kg)   Body mass index is 31.54 kg/(m^2).   MMSE - Mini Mental State Exam 06/16/2015 03/01/2015 08/27/2014  Orientation to time 0 1 2  Orientation to Place _0 Registration _1 Attention/ Calculation 0 0 0  Recall 0 0 0  Language- name 2 objects _2 Language- repeat 1 1 0  Language- follow 3 step command _3 Language- read & follow direction _4 Write a sentence 0 0 0  Write a sentence-comments unable - -  Copy design 0 0 0  Copy design-comments unable - -  Total score _5 Generalized: Well developed, in no acute distress   Neurological examination  Mentation: Alert. Follows all commands speech and language fluent Cranial nerve II-XII: Pupils were equal round reactive to light. Extraocular movements were full, visual field were full on confrontational test. Facial sensation and strength were normal. Uvula tongue midline. Head turning and shoulder shrug  were normal and symmetric. Motor: The motor testing reveals 5 over 5 strength of all 4 extremities. Good symmetric motor tone is noted throughout. Patient tends to lean to the right while sitting. Patient is more slow-moving on the left. Sensory: Sensory testing is intact to soft touch on all 4 extremities. No evidence of extinction is noted.  Coordination: Cerebellar testing reveals good finger-nose-finger but difficulty with heel-to-shin bilaterally.  Gait and station: patient requires assistance when standing. He has a stooped posture. Shuffling gait with decreased arm swing.   DIAGNOSTIC DATA (LABS, IMAGING, TESTING) - I reviewed patient records, labs, notes, testing and imaging myself where available.       ASSESSMENT AND PLAN 82 y.o. 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 here with:  1. Parkinson's disease 2. Abnormality of gait  On exam the patient did not exhibit any left-sided weakness. The patient does tend to lean to the left when sitting. The patient will increase his Sinemet to 1-1/2 tablets 3 times a day.  The family is requesting physical therapy I will put another referral for this. I will also refer the patient for home health for possible assistance with bathing per family request. I do  agree with this. Patient advised that if his symptoms worsen or he develops new symptoms he should let us know. He will follow-up in April with Dr. Carmelia Bake, MSN, NP-C 06/16/2015, 4:05 PM [facility] [address] [place], [place] [address] [phone]  I reviewed the above note and documentation by the Nurse Practitioner and agree with the history, physical exam, assessment and plan as outlined above. I was immediately available for face-to-face consultation. Star Age, MD, PhD [facility])

## 2015-06-16 NOTE — Patient Instructions (Signed)
Referral for home PT and home health  Increase Sinement to 1.5 tablets three times a day If your symptoms worsen or you develop new symptoms please let us know.

## 2015-06-25 ENCOUNTER — Other Ambulatory Visit: Payer: Self-pay | Admitting: Neurology

## 2015-06-28 ENCOUNTER — Telehealth: Payer: Self-pay | Admitting: Adult Health

## 2015-06-28 NOTE — Telephone Encounter (Signed)
Son Michael [name] called to advise it has been over a week and still hasn't heard from [facility] regarding referral for in home help.

## 2015-06-28 NOTE — Telephone Encounter (Signed)
Called and left message for son That Michael [name] would be calling him today from [place].

## 2015-07-01 ENCOUNTER — Telehealth: Payer: Self-pay | Admitting: Neurology

## 2015-07-01 NOTE — Telephone Encounter (Signed)
[facility] Home health called and needs orders for : pt once a week for 1 wk and 2 times a week for 8 weeks for balance and gait training as well as fall prevention. May call [phone], Baruch Gouty

## 2015-07-05 NOTE — Telephone Encounter (Signed)
This is fine 

## 2015-07-05 NOTE — Telephone Encounter (Signed)
Ok to send in? Or I can give verbal.

## 2015-07-05 NOTE — Telephone Encounter (Signed)
LM with VO. Left call back number if needs anything further.

## 2015-07-06 ENCOUNTER — Telehealth: Payer: Self-pay | Admitting: Neurology

## 2015-07-06 NOTE — Telephone Encounter (Signed)
Ok for [employer]. According to phone note in 10/2013- the patient would be getting B12 injections from his PCP due to insurance

## 2015-07-06 NOTE — Telephone Encounter (Signed)
You gave ok for PT yesterday. Ok for CNA 2x a week for ADLs? Also B12 injections, I do not see how ofter the patient is getting this. What do you recommend?

## 2015-07-06 NOTE — Telephone Encounter (Signed)
[facility] called and needs orders. They would like to see the pt for 8 weeks. The wife would also like a cna 2 x a week for help with bathing. May call [phone], Mardella Layman. Wife wants to know if while under [facility] if they can give the B12 injections. If so they will need a rx.

## 2015-07-07 NOTE — Telephone Encounter (Signed)
LM for Michael [name] to call back

## 2015-07-07 NOTE — Telephone Encounter (Signed)
Michael [name] called back and took VO. She voiced understanding about contacting PCP about B12 injections.

## 2015-07-15 ENCOUNTER — Other Ambulatory Visit: Payer: Self-pay | Admitting: Neurology

## 2015-07-16 ENCOUNTER — Encounter: Payer: Self-pay | Admitting: Neurology

## 2015-08-25 ENCOUNTER — Ambulatory Visit: Payer: Medicare Other | Admitting: Neurology

## 2015-08-26 ENCOUNTER — Telehealth: Payer: Self-pay | Admitting: Neurology

## 2015-08-26 NOTE — Telephone Encounter (Signed)
Returned call to [employer] w/ [facility]. Gave VO per Dr. Anne HahnWillis [facility] twice a week x 6 weeks.

## 2015-08-26 NOTE — Telephone Encounter (Signed)
[facility] [phone] called to request verbal order for continued Home Health PT, twice a week for 6 weeks and also [facility] for 6 weeks.

## 2015-08-29 ENCOUNTER — Encounter (HOSPITAL_COMMUNITY): Payer: Self-pay | Admitting: Emergency Medicine

## 2015-08-29 ENCOUNTER — Emergency Department (HOSPITAL_COMMUNITY): Payer: Medicare Other

## 2015-08-29 ENCOUNTER — Inpatient Hospital Stay (HOSPITAL_COMMUNITY): Payer: Medicare Other

## 2015-08-29 ENCOUNTER — Inpatient Hospital Stay (HOSPITAL_COMMUNITY)
Admission: EM | Admit: 2015-08-29 | Discharge: 2015-09-05 | DRG: 177 | Disposition: A | Discharge status: Hospice | Payer: Medicare Other | Attending: Internal Medicine | Admitting: Internal Medicine

## 2015-08-29 DIAGNOSIS — R4182 Altered mental status, unspecified: Secondary | ICD-10-CM | POA: Diagnosis present

## 2015-08-29 DIAGNOSIS — B952 Enterococcus as the cause of diseases classified elsewhere: Secondary | ICD-10-CM | POA: Diagnosis present

## 2015-08-29 DIAGNOSIS — Y92009 Unspecified place in unspecified non-institutional (private) residence as the place of occurrence of the external cause: Secondary | ICD-10-CM

## 2015-08-29 DIAGNOSIS — F0280 Dementia in other diseases classified elsewhere without behavioral disturbance: Secondary | ICD-10-CM | POA: Diagnosis present

## 2015-08-29 DIAGNOSIS — W19XXXD Unspecified fall, subsequent encounter: Secondary | ICD-10-CM | POA: Diagnosis not present

## 2015-08-29 DIAGNOSIS — F0390 Unspecified dementia without behavioral disturbance: Secondary | ICD-10-CM | POA: Diagnosis present

## 2015-08-29 DIAGNOSIS — R1312 Dysphagia, oropharyngeal phase: Secondary | ICD-10-CM | POA: Diagnosis present

## 2015-08-29 DIAGNOSIS — E538 Deficiency of other specified B group vitamins: Secondary | ICD-10-CM | POA: Diagnosis present

## 2015-08-29 DIAGNOSIS — R29810 Facial weakness: Secondary | ICD-10-CM | POA: Diagnosis present

## 2015-08-29 DIAGNOSIS — R627 Adult failure to thrive: Secondary | ICD-10-CM | POA: Diagnosis present

## 2015-08-29 DIAGNOSIS — Z1611 Resistance to penicillins: Secondary | ICD-10-CM | POA: Diagnosis present

## 2015-08-29 DIAGNOSIS — E7220 Disorder of urea cycle metabolism, unspecified: Secondary | ICD-10-CM | POA: Diagnosis present

## 2015-08-29 DIAGNOSIS — R509 Fever, unspecified: Secondary | ICD-10-CM

## 2015-08-29 DIAGNOSIS — R6251 Failure to thrive (child): Secondary | ICD-10-CM | POA: Diagnosis present

## 2015-08-29 DIAGNOSIS — J690 Pneumonitis due to inhalation of food and vomit: Secondary | ICD-10-CM | POA: Diagnosis not present

## 2015-08-29 DIAGNOSIS — F0391 Unspecified dementia with behavioral disturbance: Secondary | ICD-10-CM | POA: Diagnosis not present

## 2015-08-29 DIAGNOSIS — G20 Parkinson's disease: Secondary | ICD-10-CM | POA: Diagnosis present

## 2015-08-29 DIAGNOSIS — Z7982 Long term (current) use of aspirin: Secondary | ICD-10-CM

## 2015-08-29 DIAGNOSIS — T8089XA Other complications following infusion, transfusion and therapeutic injection, initial encounter: Secondary | ICD-10-CM | POA: Diagnosis not present

## 2015-08-29 DIAGNOSIS — Z8673 Personal history of transient ischemic attack (TIA), and cerebral infarction without residual deficits: Secondary | ICD-10-CM | POA: Diagnosis not present

## 2015-08-29 DIAGNOSIS — R059 Cough, unspecified: Secondary | ICD-10-CM

## 2015-08-29 DIAGNOSIS — G9340 Encephalopathy, unspecified: Secondary | ICD-10-CM | POA: Diagnosis not present

## 2015-08-29 DIAGNOSIS — Z515 Encounter for palliative care: Secondary | ICD-10-CM | POA: Diagnosis not present

## 2015-08-29 DIAGNOSIS — G9341 Metabolic encephalopathy: Secondary | ICD-10-CM | POA: Diagnosis present

## 2015-08-29 DIAGNOSIS — Y828 Other medical devices associated with adverse incidents: Secondary | ICD-10-CM | POA: Diagnosis not present

## 2015-08-29 DIAGNOSIS — M7989 Other specified soft tissue disorders: Secondary | ICD-10-CM | POA: Diagnosis not present

## 2015-08-29 DIAGNOSIS — R269 Unspecified abnormalities of gait and mobility: Secondary | ICD-10-CM | POA: Diagnosis present

## 2015-08-29 DIAGNOSIS — R262 Difficulty in walking, not elsewhere classified: Secondary | ICD-10-CM

## 2015-08-29 DIAGNOSIS — W19XXXA Unspecified fall, initial encounter: Secondary | ICD-10-CM | POA: Diagnosis present

## 2015-08-29 DIAGNOSIS — R296 Repeated falls: Secondary | ICD-10-CM | POA: Diagnosis present

## 2015-08-29 DIAGNOSIS — N390 Urinary tract infection, site not specified: Secondary | ICD-10-CM | POA: Diagnosis present

## 2015-08-29 DIAGNOSIS — B9620 Unspecified Escherichia coli [E. coli] as the cause of diseases classified elsewhere: Secondary | ICD-10-CM | POA: Diagnosis present

## 2015-08-29 DIAGNOSIS — R05 Cough: Secondary | ICD-10-CM

## 2015-08-29 DIAGNOSIS — R1310 Dysphagia, unspecified: Secondary | ICD-10-CM | POA: Diagnosis not present

## 2015-08-29 DIAGNOSIS — Z7189 Other specified counseling: Secondary | ICD-10-CM | POA: Insufficient documentation

## 2015-08-29 DIAGNOSIS — Z66 Do not resuscitate: Secondary | ICD-10-CM | POA: Diagnosis present

## 2015-08-29 DIAGNOSIS — J189 Pneumonia, unspecified organism: Secondary | ICD-10-CM | POA: Clinically undetermined

## 2015-08-29 LAB — CBC
HCT: 39.1 % (ref 39.0–52.0)
HEMOGLOBIN: 13.1 g/dL (ref 13.0–17.0)
MCH: 30 pg (ref 26.0–34.0)
MCHC: 33.5 g/dL (ref 30.0–36.0)
MCV: 89.5 fL (ref 78.0–100.0)
PLATELETS: 272 10*3/uL (ref 150–400)
RBC: 4.37 MIL/uL (ref 4.22–5.81)
RDW: 12.7 % (ref 11.5–15.5)
WBC: 11.1 10*3/uL — ABNORMAL HIGH (ref 4.0–10.5)

## 2015-08-29 LAB — CBG MONITORING, ED: Glucose-Capillary: 88 mg/dL (ref 65–99)

## 2015-08-29 LAB — URINALYSIS, ROUTINE W REFLEX MICROSCOPIC
BILIRUBIN URINE: NEGATIVE
Glucose, UA: NEGATIVE mg/dL
Ketones, ur: NEGATIVE mg/dL
Leukocytes, UA: NEGATIVE
NITRITE: NEGATIVE
PROTEIN: 30 mg/dL — AB
SPECIFIC GRAVITY, URINE: 1.03 (ref 1.005–1.030)
pH: 5 (ref 5.0–8.0)

## 2015-08-29 LAB — BLOOD GAS, ARTERIAL
ACID-BASE EXCESS: 0.7 mmol/L (ref 0.0–2.0)
Bicarbonate: 24.8 mEq/L — ABNORMAL HIGH (ref 20.0–24.0)
DRAWN BY: 11249
O2 CONTENT: 2 L/min
O2 SAT: 94.5 %
PATIENT TEMPERATURE: 98.4
PCO2 ART: 39.5 mmHg (ref 35.0–45.0)
TCO2: 22.4 mmol/L (ref 0–100)
pH, Arterial: 7.413 (ref 7.350–7.450)
pO2, Arterial: 72.3 mmHg — ABNORMAL LOW (ref 80.0–100.0)

## 2015-08-29 LAB — BASIC METABOLIC PANEL
ANION GAP: 9 (ref 5–15)
BUN: 23 mg/dL — ABNORMAL HIGH (ref 6–20)
CALCIUM: 9.9 mg/dL (ref 8.9–10.3)
CO2: 26 mmol/L (ref 22–32)
CREATININE: 1.14 mg/dL (ref 0.61–1.24)
Chloride: 107 mmol/L (ref 101–111)
GFR, EST NON AFRICAN AMERICAN: 56 mL/min — AB (ref >60)
Glucose, Bld: 110 mg/dL — ABNORMAL HIGH (ref 65–99)
Potassium: 4.6 mmol/L (ref 3.5–5.1)
SODIUM: 142 mmol/L (ref 135–145)

## 2015-08-29 LAB — URINE MICROSCOPIC-ADD ON

## 2015-08-29 LAB — AMMONIA: Ammonia: 137 umol/L — ABNORMAL HIGH (ref 9–35)

## 2015-08-29 MED ORDER — CARBIDOPA-LEVODOPA 25-100 MG PO TABS
1.5000 | ORAL_TABLET | Freq: Three times a day (TID) | ORAL | Status: DC
  Administered 2015-08-30 – 2015-09-05 (×15): 1.5 via ORAL
  Filled 2015-08-29 (×21): qty 1.5

## 2015-08-29 MED ORDER — ASPIRIN 325 MG PO TABS
325.0000 mg | ORAL_TABLET | Freq: Every day | ORAL | Status: DC
  Administered 2015-08-31 – 2015-09-05 (×5): 325 mg via ORAL
  Filled 2015-08-29 (×7): qty 1

## 2015-08-29 MED ORDER — ENOXAPARIN SODIUM 40 MG/0.4ML ~~LOC~~ SOLN
40.0000 mg | Freq: Every day | SUBCUTANEOUS | Status: DC
  Administered 2015-08-30 – 2015-09-04 (×7): 40 mg via SUBCUTANEOUS
  Filled 2015-08-29 (×7): qty 0.4

## 2015-08-29 MED ORDER — NALOXONE HCL 0.4 MG/ML IJ SOLN
0.4000 mg | Freq: Once | INTRAMUSCULAR | Status: AC
  Administered 2015-08-29: 0.4 mg via INTRAVENOUS
  Filled 2015-08-29: qty 1

## 2015-08-29 MED ORDER — DEXTROSE-NACL 5-0.45 % IV SOLN
INTRAVENOUS | Status: DC
  Administered 2015-08-29 – 2015-08-30 (×3): via INTRAVENOUS

## 2015-08-29 NOTE — ED Notes (Signed)
CBG 88 

## 2015-08-29 NOTE — ED Notes (Signed)
Did not ambulate pt because he is unable to keep his eyes open; MD notified. Family at bedside.

## 2015-08-29 NOTE — ED Provider Notes (Signed)
CSN: [id number]     Arrival date & time 08/29/15  1623 History   First MD Initiated Contact with Patient 08/29/15 1643     Chief Complaint  Patient presents with  . Fall  . Weakness     (Consider location/radiation/quality/duration/timing/severity/associated sxs/prior Treatment) HPI Comments: LEVEL 5 CAVEAT FOR ALTERED MENTAL STATUS.  Pt comes in with cc of AMS. Per wife, who is at the bedside, pt has been progressively declining with weakness over the past several days. Pt has hx of parkinsons, early dementia, but otherwise quite healthy and not on pain meds, sedation meds. Pt has had multiple falls the last week, including one today, which led to them calling EMS. Pt is sleepy. Arousable to sternal rub, and he provides me his name and doesn't provide any other meaningful history.   The history is provided by medical records and a relative. The history is limited by the condition of the patient.    Past Medical History  Diagnosis Date  . 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 Unspecified transient cerebral ischemia   . Hallucinations   . Other vitamin B12 deficiency anemia   . Paralysis agitans ([facility])   . Parkinson's disease ([facility]) 03/27/2013   Past Surgical History  Procedure Laterality Date  . Cholecystectomy    . Transurethral resection of prostate    . Appendectomy    . Ptca  1987   Family History  Problem Relation Age of Onset  . Parkinson's disease Sister   . Cancer Brother   . Parkinson's disease Daughter 61   Social History  Substance Use Topics  . Smoking status: Never Smoker   . Smokeless tobacco: None  . Alcohol Use: No    Review of Systems  Unable to perform ROS: Mental status change      Allergies  Review of patient's allergies indicates no known allergies.  Home Medications   Prior to Admission medications   Medication Sig Start Date End Date Taking? Authorizing Provider  acetaminophen (TYLENOL) 500 MG  tablet Take 500 mg by mouth every 6 (six) hours as needed for moderate pain.    Yes Historical Provider, MD  aspirin 325 MG tablet Take 325 mg by mouth daily.   Yes Historical Provider, MD  carbidopa-levodopa (SINEMET IR) 25-100 MG tablet Take 1.5 tablets by mouth 3 (three) times daily. 07/15/15  Yes Huston Foley, MD  Cholecalciferol (VITAMIN D-3) 1000 UNITS CAPS Take 1 capsule by mouth daily.   Yes Historical Provider, MD  donepezil (ARICEPT) 10 MG tablet Take 1 tablet by mouth at  bedtime 07/15/15  Yes Huston Foley, MD  fish oil-omega-3 fatty acids 1000 MG capsule Take 1,200 mg by mouth daily.   Yes Historical Provider, MD  multivitamin-lutein (OCUVITE-LUTEIN) CAPS capsule Take 1 capsule by mouth daily.   Yes Historical Provider, MD  oxybutynin (DITROPAN) 5 MG tablet Take 1 tablet by mouth  nightly 06/25/15  Yes Huston Foley, MD   BP 157/79 mmHg  Pulse 58  Temp(Src) 98.4 F (36.9 C)  Resp 12  Ht  (1.651 m)  Wt 160 lb (72.576 kg)  BMI 26.63 kg/m2  SpO2 99% Physical Exam  Constitutional: He is oriented to person, place, and time. He appears well-developed.  HENT:  Head: Atraumatic.  Neck: Neck supple.  No midline c-spine tenderness, pt able to turn head to 45 degrees bilaterally without any pain and able to flex neck to the chest and extend without any pain or neurologic  symptoms.   Cardiovascular: Normal rate.   Pulmonary/Chest: Effort normal.  Musculoskeletal:  Head to toe evaluation shows no hematoma, bleeding of the scalp, no facial abrasions, step offs, crepitus, no tenderness to palpation of the bilateral upper and lower extremities, no gross deformities, no chest tenderness, no pelvic pain.   Neurological: He is alert and oriented to person, place, and time.  Skin: Skin is warm.  Nursing note and vitals reviewed.   ED Course  Procedures (including critical care time) Labs Review Labs Reviewed  BASIC METABOLIC PANEL - Abnormal; Notable for the following:    Glucose, Bld 110  (*)    BUN 23 (*)    GFR calc non Af Amer 56 (*)    All other components within normal limits  CBC - Abnormal; Notable for the following:    WBC 11.1 (*)    All other components within normal limits  URINALYSIS, ROUTINE W REFLEX MICROSCOPIC (NOT AT [facility]) - Abnormal; Notable for the following:    Hgb urine dipstick TRACE (*)    Protein, ur 30 (*)    All other components within normal limits  URINE MICROSCOPIC-ADD ON - Abnormal; Notable for the following:    Squamous Epithelial / LPF 0-5 (*)    Bacteria, UA RARE (*)    Casts HYALINE CASTS (*)    All other components within normal limits  CBG MONITORING, ED    Imaging Review Dg Pelvis 1-2 Views  08/29/2015  CLINICAL DATA:  Fall, unwitnessed. EXAM: PELVIS - 1-2 VIEW COMPARISON:  None. FINDINGS: No fracture or diastasis in the pelvis. No evidence of hip dislocation on this frontal view. Degenerative changes in the visualized lower lumbar spine. Diffuse osteopenia. No focal osseous lesions. IMPRESSION: No fracture or diastasis in the pelvis.  Diffuse osteopenia. Electronically Signed   By: Delbert Phenix M.D.   On: 08/29/2015 18:02   Ct Head Wo Contrast  08/29/2015  CLINICAL DATA:  Patient status post fall. History of Parkinson's. Confusion. No reported loss consciousness. EXAM: CT HEAD WITHOUT CONTRAST CT CERVICAL SPINE WITHOUT CONTRAST TECHNIQUE: Multidetector CT imaging of the head and cervical spine was performed following the standard protocol without intravenous contrast. Multiplanar CT image reconstructions of the cervical spine were also generated. COMPARISON:  MRI brain 07/08/2009 FINDINGS: CT HEAD FINDINGS Ventricles and sulci are appropriate for patient's age. Periventricular and subcortical white matter hypodensity compatible with chronic microvascular ischemic changes. Orbits are unremarkable. Scalp soft tissues are unremarkable. Paranasal sinuses are well aerated. Mastoid air cells are unremarkable. Calvarium is intact. CT CERVICAL  SPINE FINDINGS Normal anatomic alignment. Osseous fusion of the C2-3 and C3-4 levels. Additionally there is grade 1 anterolisthesis of C6-7 likely secondary to facet degenerative changes and fusion at this level. There is right-sided facet fusion at the C6-7 and C7-T1 levels. The craniocervical junction is intact. Relative preservation of the vertebral body heights. IMPRESSION: No acute intracranial process. No acute cervical spine fracture. Chronic microvascular ischemic changes. Multilevel cervical spinal fusion and associated degenerative changes. Electronically Signed   By: Annia Belt M.D.   On: 08/29/2015 18:14   Ct Cervical Spine Wo Contrast  08/29/2015  CLINICAL DATA:  Patient status post fall. History of Parkinson's. Confusion. No reported loss consciousness. EXAM: CT HEAD WITHOUT CONTRAST CT CERVICAL SPINE WITHOUT CONTRAST TECHNIQUE: Multidetector CT imaging of the head and cervical spine was performed following the standard protocol without intravenous contrast. Multiplanar CT image reconstructions of the cervical spine were also generated. COMPARISON:  MRI brain 07/08/2009 FINDINGS:  CT HEAD FINDINGS Ventricles and sulci are appropriate for patient's age. Periventricular and subcortical white matter hypodensity compatible with chronic microvascular ischemic changes. Orbits are unremarkable. Scalp soft tissues are unremarkable. Paranasal sinuses are well aerated. Mastoid air cells are unremarkable. Calvarium is intact. CT CERVICAL SPINE FINDINGS Normal anatomic alignment. Osseous fusion of the C2-3 and C3-4 levels. Additionally there is grade 1 anterolisthesis of C6-7 likely secondary to facet degenerative changes and fusion at this level. There is right-sided facet fusion at the C6-7 and C7-T1 levels. The craniocervical junction is intact. Relative preservation of the vertebral body heights. IMPRESSION: No acute intracranial process. No acute cervical spine fracture. Chronic microvascular ischemic  changes. Multilevel cervical spinal fusion and associated degenerative changes. Electronically Signed   By: Annia Beltrew  Davis M.D.   On: 08/29/2015 18:14   I have personally reviewed and evaluated these images and lab results as part of my medical decision-making.   EKG Interpretation   Date/Time:  Sunday August 29 2015 16:33:09 EDT Ventricular Rate:  58 PR Interval:  240 QRS Duration: 92 QT Interval:  425 QTC Calculation: 417 R Axis:   -30 Text Interpretation:  Sinus rhythm Prolonged PR interval Left axis  deviation Low voltage, precordial leads Consider anterior infarct No acute  changes Confirmed by Rhunette CroftNANAVATI, MD, Janey GentaANKIT [phone]([id number]) on 08/29/2015 4:48:40 PM      MDM   Final diagnoses:  Failure to thrive (0-17)  Unable to ambulate    Pt comes in with AMS and falls. DDx includes: - Mechanical falls - ICH - Fractures - Contusions - Soft tissue injury   Ct head, cspine ordered - and negative. Xrays of the pelvis is normal.  Pt tried to be ambulated post imaging, and he was very unsteady.  He is still very somnolent. Pupils are normal. ABG ordered. No liver disease. No new meds confirmed. Will admit as obs.     Derwood KaplanAnkit Rheannon Cerney, MD 08/29/15 [phone]

## 2015-08-29 NOTE — ED Notes (Signed)
Pt presents via EMS from home after multiple unwitnessed falls with residual rib pain on the right side.  Hx parkinsons, ambulatory with walker at baseline but wife reports worsening issues with mobility over the last few weeks.  EMS states that he was alert and oriented on arrival but en route became more confused and difficult to understand. Vitals WNL en route.

## 2015-08-29 NOTE — H&P (Signed)
History and Physical  Michael BackerBobby R [name] ZOX:[id number] DOB: 04-Oct-1927 DOA: 08/29/2015  PCP:  [name], MD   Chief Complaint:  AMS  History of Present Illness:  Patient is a 82 yo male with history of PD, early dementia who was brought with cc of fall. Patient has had issues with imbalance for the past month with several falls over the past few weeks. He also has had decline is his mental state with less interactive communication with family and sleeping more. He was not complaining of dizziness or vertigo. No focal complaints. No chest pain. No vomiting or diarrhea. No other complaints per wife and grandaughter as history was taken from them. Patient was very sleepy that whole time and could not say any words. At baseline he is not very interactive but would still talk and answer questions appropriately.   Review of Systems:  Can't assess due to mental status, per HPI from family.  Past Medical and Surgical History:   Past Medical History  Diagnosis Date  . 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 Unspecified transient cerebral ischemia   . Hallucinations   . Other vitamin B12 deficiency anemia   . Paralysis agitans (HCC)   . Parkinson's disease (HCC) 03/27/2013   Past Surgical History  Procedure Laterality Date  . Cholecystectomy    . Transurethral resection of prostate    . Appendectomy    . Ptca  1987    Social History:   reports that he has never smoked. He does not have any smokeless tobacco history on file. He reports that he does not drink alcohol or use illicit drugs.    No Known Allergies  Family History  Problem Relation Age of Onset  . Parkinson's disease Sister   . Cancer Brother   . Parkinson's disease Daughter [phone]      Prior to Admission medications   Medication Sig Start Date End Date Taking? Authorizing Provider  acetaminophen (TYLENOL) 500 MG tablet Take 500 mg by mouth every 6 (six) hours as needed  for moderate pain.    Yes Historical Provider, MD  aspirin 325 MG tablet Take 325 mg by mouth daily.   Yes Historical Provider, MD  carbidopa-levodopa (SINEMET IR) 25-100 MG tablet Take 1.5 tablets by mouth 3 (three) times daily. 07/15/15  Yes Huston FoleySaima Athar, MD  Cholecalciferol (VITAMIN D-3) 1000 UNITS CAPS Take 1 capsule by mouth daily.   Yes Historical Provider, MD  donepezil (ARICEPT) 10 MG tablet Take 1 tablet by mouth at  bedtime 07/15/15  Yes Huston FoleySaima Athar, MD  fish oil-omega-3 fatty acids 1000 MG capsule Take 1,200 mg by mouth daily.   Yes Historical Provider, MD  multivitamin-lutein (OCUVITE-LUTEIN) CAPS capsule Take 1 capsule by mouth daily.   Yes Historical Provider, MD  oxybutynin (DITROPAN) 5 MG tablet Take 1 tablet by mouth  nightly 06/25/15  Yes Huston FoleySaima Athar, MD    Physical Exam: BP 157/79 mmHg  Pulse 58  Temp(Src) 98.4 F (36.9 C)  Resp 12  Ht 5\' 5"  (1.651 m)  Wt 72.576 kg (160 lb)  BMI 26.63 kg/m2  SpO2 99%  GENERAL :   appears to be in no acute distress. sleeping HEAD:           normocephalic. EYES:         Constricted pupils with sluggish reaction to light bilaterally EARS:           hearing grossly intact. NOSE:  No nasal discharge. THROAT:     Oral cavity and pharynx normal.   NECK:          supple, non-tender.  CARDIAC:    Normal S1 and S2. No gallop. No murmurs.  Vascular:     no peripheral edema. LUNGS:       Clear to auscultation  ABDOMEN: Positive bowel sounds. Soft, nondistended, nontender. No guarding or rebound.      MSK:           No joint erythema or tenderness.  EXT           : No significant deformity or joint abnormality. Neuro        :hard to arouse ( chest rubs), respond to command by squeezing hands, trying hard to open eyes, but overall very sleepy and not interactive, withdraw for pain. SKIN:            No rash. No lesions.           Labs on Admission:  Reviewed.   Radiological Exams on Admission: Dg Pelvis 1-2 Views  08/29/2015   CLINICAL DATA:  Fall, unwitnessed. EXAM: PELVIS - 1-2 VIEW COMPARISON:  None. FINDINGS: No fracture or diastasis in the pelvis. No evidence of hip dislocation on this frontal view. Degenerative changes in the visualized lower lumbar spine. Diffuse osteopenia. No focal osseous lesions. IMPRESSION: No fracture or diastasis in the pelvis.  Diffuse osteopenia. Electronically Signed   By: Delbert Phenix M.D.   On: 08/29/2015 18:02   Ct Head Wo Contrast  08/29/2015  CLINICAL DATA:  Patient status post fall. History of Parkinson's. Confusion. No reported loss consciousness. EXAM: CT HEAD WITHOUT CONTRAST CT CERVICAL SPINE WITHOUT CONTRAST TECHNIQUE: Multidetector CT imaging of the head and cervical spine was performed following the standard protocol without intravenous contrast. Multiplanar CT image reconstructions of the cervical spine were also generated. COMPARISON:  MRI brain 07/08/2009 FINDINGS: CT HEAD FINDINGS Ventricles and sulci are appropriate for patient's age. Periventricular and subcortical white matter hypodensity compatible with chronic microvascular ischemic changes. Orbits are unremarkable. Scalp soft tissues are unremarkable. Paranasal sinuses are well aerated. Mastoid air cells are unremarkable. Calvarium is intact. CT CERVICAL SPINE FINDINGS Normal anatomic alignment. Osseous fusion of the C2-3 and C3-4 levels. Additionally there is grade 1 anterolisthesis of C6-7 likely secondary to facet degenerative changes and fusion at this level. There is right-sided facet fusion at the C6-7 and C7-T1 levels. The craniocervical junction is intact. Relative preservation of the vertebral body heights. IMPRESSION: No acute intracranial process. No acute cervical spine fracture. Chronic microvascular ischemic changes. Multilevel cervical spinal fusion and associated degenerative changes. Electronically Signed   By: Annia Belt M.D.   On: 08/29/2015 18:14   Ct Cervical Spine Wo Contrast  08/29/2015  CLINICAL DATA:   Patient status post fall. History of Parkinson's. Confusion. No reported loss consciousness. EXAM: CT HEAD WITHOUT CONTRAST CT CERVICAL SPINE WITHOUT CONTRAST TECHNIQUE: Multidetector CT imaging of the head and cervical spine was performed following the standard protocol without intravenous contrast. Multiplanar CT image reconstructions of the cervical spine were also generated. COMPARISON:  MRI brain 07/08/2009 FINDINGS: CT HEAD FINDINGS Ventricles and sulci are appropriate for patient's age. Periventricular and subcortical white matter hypodensity compatible with chronic microvascular ischemic changes. Orbits are unremarkable. Scalp soft tissues are unremarkable. Paranasal sinuses are well aerated. Mastoid air cells are unremarkable. Calvarium is intact. CT CERVICAL SPINE FINDINGS Normal anatomic alignment. Osseous fusion of the C2-3 and C3-4 levels. Additionally there  is grade 1 anterolisthesis of C6-7 likely secondary to facet degenerative changes and fusion at this level. There is right-sided facet fusion at the C6-7 and C7-T1 levels. The craniocervical junction is intact. Relative preservation of the vertebral body heights. IMPRESSION: No acute intracranial process. No acute cervical spine fracture. Chronic microvascular ischemic changes. Multilevel cervical spinal fusion and associated degenerative changes. Electronically Signed   By: Annia Belt M.D.   On: 08/29/2015 18:14    EKG:  Independently reviewed. NSR  Assessment/Plan  Altered mental status: Baseline: mild dementia, interactive , declining gradually over a month.  Ddx:   Neurologic:           Structural (mass)/ bleeding: head CT scan unremarkable for acute changes.                   Stroke / TIA: CT head unremarkable, no focal complaints, will consider MRI           Meds/Toxins:          Iatrogenic: recent mild increase in levodopa, unlikely to explain sx. Wife takes pain pills, will check response to Narcan ( esp with constricted  pupils). Consult to neuro in am. Infectious:           UA: neg. CT pelvis: unremarkable. Will check CXR. No skin infection. Metabolic :                   Will check ammonia and CBG Q6H          Will keep NPO for now: risk of aspiration   continue IVF 1/2 NS & D5   Fall: mechanical ,likely due to above.    DVT prophylaxis:  enoxaparin  Consultants: Neuro in am Code Status: Full     Eston Esters M.D [facility]

## 2015-08-30 ENCOUNTER — Ambulatory Visit: Payer: Medicare Other | Admitting: Neurology

## 2015-08-30 ENCOUNTER — Telehealth: Payer: Self-pay | Admitting: Neurology

## 2015-08-30 DIAGNOSIS — G9341 Metabolic encephalopathy: Secondary | ICD-10-CM | POA: Diagnosis present

## 2015-08-30 DIAGNOSIS — R05 Cough: Secondary | ICD-10-CM | POA: Diagnosis present

## 2015-08-30 DIAGNOSIS — W19XXXA Unspecified fall, initial encounter: Secondary | ICD-10-CM | POA: Diagnosis present

## 2015-08-30 DIAGNOSIS — E7220 Disorder of urea cycle metabolism, unspecified: Secondary | ICD-10-CM

## 2015-08-30 DIAGNOSIS — G9340 Encephalopathy, unspecified: Secondary | ICD-10-CM

## 2015-08-30 DIAGNOSIS — R6251 Failure to thrive (child): Secondary | ICD-10-CM | POA: Diagnosis present

## 2015-08-30 DIAGNOSIS — R059 Cough, unspecified: Secondary | ICD-10-CM | POA: Diagnosis present

## 2015-08-30 LAB — CBC WITH DIFFERENTIAL/PLATELET
Basophils Absolute: 0 10*3/uL (ref 0.0–0.1)
Basophils Relative: 1 %
EOS ABS: 0.9 10*3/uL — AB (ref 0.0–0.7)
EOS PCT: 11 %
HCT: 35.3 % — ABNORMAL LOW (ref 39.0–52.0)
HEMOGLOBIN: 11.6 g/dL — AB (ref 13.0–17.0)
LYMPHS ABS: 1.4 10*3/uL (ref 0.7–4.0)
Lymphocytes Relative: 17 %
MCH: 30 pg (ref 26.0–34.0)
MCHC: 32.9 g/dL (ref 30.0–36.0)
MCV: 91.2 fL (ref 78.0–100.0)
MONOS PCT: 7 %
Monocytes Absolute: 0.6 10*3/uL (ref 0.1–1.0)
NEUTROS PCT: 64 %
Neutro Abs: 5.5 10*3/uL (ref 1.7–7.7)
PLATELETS: 251 10*3/uL (ref 150–400)
RBC: 3.87 MIL/uL — ABNORMAL LOW (ref 4.22–5.81)
RDW: 13.1 % (ref 11.5–15.5)
WBC: 8.5 10*3/uL (ref 4.0–10.5)

## 2015-08-30 LAB — GLUCOSE, CAPILLARY
GLUCOSE-CAPILLARY: 94 mg/dL (ref 65–99)
Glucose-Capillary: 104 mg/dL — ABNORMAL HIGH (ref 65–99)
Glucose-Capillary: 105 mg/dL — ABNORMAL HIGH (ref 65–99)
Glucose-Capillary: 109 mg/dL — ABNORMAL HIGH (ref 65–99)
Glucose-Capillary: 137 mg/dL — ABNORMAL HIGH (ref 65–99)

## 2015-08-30 LAB — COMPREHENSIVE METABOLIC PANEL
ALT: 14 U/L — AB (ref 17–63)
AST: 20 U/L (ref 15–41)
Albumin: 3.3 g/dL — ABNORMAL LOW (ref 3.5–5.0)
Alkaline Phosphatase: 54 U/L (ref 38–126)
Anion gap: 8 (ref 5–15)
BUN: 20 mg/dL (ref 6–20)
CO2: 25 mmol/L (ref 22–32)
CREATININE: 1.09 mg/dL (ref 0.61–1.24)
Calcium: 9.3 mg/dL (ref 8.9–10.3)
Chloride: 108 mmol/L (ref 101–111)
GFR calc Af Amer: >60 mL/min (ref >60)
GFR calc non Af Amer: 59 mL/min — ABNORMAL LOW (ref >60)
Glucose, Bld: 114 mg/dL — ABNORMAL HIGH (ref 65–99)
Potassium: 4 mmol/L (ref 3.5–5.1)
SODIUM: 141 mmol/L (ref 135–145)
Total Bilirubin: 0.9 mg/dL (ref 0.3–1.2)
Total Protein: 6 g/dL — ABNORMAL LOW (ref 6.5–8.1)

## 2015-08-30 LAB — TSH: TSH: 2.336 u[IU]/mL (ref 0.350–4.500)

## 2015-08-30 MED ORDER — LACTULOSE ENEMA
300.0000 mL | Freq: Once | RECTAL | Status: AC
Start: 2015-08-30 — End: 2015-08-30
  Administered 2015-08-30: 300 mL via RECTAL
  Filled 2015-08-30: qty 300

## 2015-08-30 MED ORDER — RESOURCE THICKENUP CLEAR PO POWD
ORAL | Status: DC | PRN
  Filled 2015-08-30: qty 125

## 2015-08-30 NOTE — Discharge Planning (Signed)
[facility].  Please reach out to our team as needed for discharge planning assistance.  My direct number is [phone].

## 2015-08-30 NOTE — Evaluation (Signed)
Clinical/Bedside Swallow Evaluation Patient Details  Name: Michael [name] MRN: [id number] Date of Birth: 25-Mar-1928  Today's Date: 08/30/2015 Time: SLP Start Time (ACUTE ONLY): 1201 SLP Stop Time (ACUTE ONLY): 1244 SLP Time Calculation (min) (ACUTE ONLY): 43 min  Past Medical History:  Past Medical History  Diagnosis Date  . 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 Unspecified transient cerebral ischemia   . Hallucinations   . Other vitamin B12 deficiency anemia   . Paralysis agitans (HCC)   . Parkinson's disease (HCC) 03/27/2013   Past Surgical History:  Past Surgical History  Procedure Laterality Date  . Cholecystectomy    . Transurethral resection of prostate    . Appendectomy    . Ptca  1987   HPI:  pt is an 82 yo male with h/p Parkinson's disease admitted to hospital with AMS, acute encephalopathy, lethargy.  RN reports pt's ammonia levels are off.  Pt has had multiple recent falls- has rib fractures.  CXR 4/16 favors ATX,  CT head negative for acute change.     Assessment / Plan / Recommendation Clinical Impression  Pt presents with clinical indications concerning for aspiration of nectar liquids via cup, likely due to premature spillage into larynx prior to swallow.  Administration modification - decreasing nectar to tsp amounts tolerated well.  Delayed oral transiting and suspected delayed pharyngeal swallow noted.  Pt leans left and has slight left upper and lower facial asymmetry = spouse reports left leaning worsening in the last few months.  Suspect baseline deficits due to parkinson's as pt reports occasionally sensing food in throat and coughing on liquids prior to admission.  Of note, pt has not received his parkinson's medications yet and this may contribute to his symptoms.  As pt without weight loss nor pulmonary infections per spouse, recommend start modifed diet of dys3/nectar with strict precautions.  Using teach back, educated pt  and spouse to findings/recommendations.  Will follow up for tolerance, indication for instrumental evaluation.  Thanks.     Aspiration Risk       Diet Recommendation Dysphagia 3 (Mech soft);Nectar-thick liquid;Other (Comment) (ice chips - single- ok)   Liquid Administration via: Spoon;No straw;Other (Comment) (no cup) Medication Administration: Whole meds with puree Supervision: Full supervision/cueing for compensatory strategies;Staff to assist with self feeding Compensations: Minimize environmental distractions;Slow rate;Small sips/bites (cue pt to clear throat/cough if voice gurgly) Postural Changes: Seated upright at 90 degrees;Remain upright for at least 30 minutes after po intake    Other  Recommendations Oral Care Recommendations: Oral care QID   Follow up Recommendations  Home health SLP    Frequency and Duration min 2x/week  2 weeks       Prognosis Prognosis for Safe Diet Advancement: Fair Barriers to Reach Goals: Time post onset;Severity of deficits;Medication      Swallow Study   General Date of Onset: 08/30/15 HPI: pt is an 82 yo male with h/p Parkinson's disease admitted to hospital with AMS, acute encephalopathy, lethargy.  RN reports pt's ammonia levels are off.  Pt has had multiple recent falls- has rib fractures.  CXR 4/16 favors ATX,  CT head negative for acute change.   Type of Study: Bedside Swallow Evaluation Diet Prior to this Study: NPO Temperature Spikes Noted: No Respiratory Status: Nasal cannula History of Recent Intubation: No Behavior/Cognition: Lethargic/Drowsy;Distractible;Requires cueing Oral Cavity Assessment: Dry Oral Care Completed by SLP: Recent completion by staff Oral Cavity - Dentition: Dentures, top;Other (Comment) (pt does not consistently use  dentures (lowers)) Vision: Functional for self-feeding Self-Feeding Abilities: Needs assist (hand over hand assist) Patient Positioning: Upright in bed Baseline Vocal Quality: Low vocal  intensity Volitional Cough: Weak Volitional Swallow: Able to elicit    Oral/Motor/Sensory Function Overall Oral Motor/Sensory Function: Mild impairment (pt leans left per spouse =- has been worsening x few months) Facial ROM: Reduced left;Suspected CN VII (facial) dysfunction Facial Symmetry: Abnormal symmetry left Facial Strength: Reduced left Lingual ROM: Reduced left Lingual Strength: Reduced Velum: Within Functional Limits   Ice Chips Ice chips: Impaired Presentation: Spoon Oral Phase Impairments: Reduced lingual movement/coordination Oral Phase Functional Implications: Prolonged oral transit Pharyngeal Phase Impairments: Suspected delayed Swallow   Thin Liquid Thin Liquid: Not tested    Nectar Thick Nectar Thick Liquid: Impaired Presentation: Cup;Spoon Oral Phase Impairments: Reduced lingual movement/coordination Oral phase functional implications: Prolonged oral transit Pharyngeal Phase Impairments: Suspected delayed Swallow;Cough - Immediate Other Comments: overt coughing with nectar via cup   Honey Thick Honey Thick Liquid: Not tested   Puree Puree: Impaired Presentation: Self Fed;Spoon Oral Phase Impairments: Reduced lingual movement/coordination Oral Phase Functional Implications: Prolonged oral transit Pharyngeal Phase Impairments: Suspected delayed Swallow;Throat Clearing - Delayed   Solid   GO   Solid: Impaired Oral Phase Impairments: Poor awareness of bolus;Impaired mastication;Reduced lingual movement/coordination Oral Phase Functional Implications: Impaired mastication Pharyngeal Phase Impairments: Suspected delayed Fredric MareSwallow        Myron Stankovich, MS [facility] SLP [phone]

## 2015-08-30 NOTE — Progress Notes (Addendum)
PT Cancellation Note  Patient Details Name: Michael BackerBobby R Ehmke MRN: [id number] DOB: 02/12/1928   Cancelled Treatment:    Reason Eval/Treat Not Completed: Other (comment) (RN and tech prepping pt for enema) RN also reports pt lethargic and not following commands.  Will check back as schedule permits.   Hopelynn Gartland,KATHrine E 08/30/2015, 10:00 AM Zenovia JarredKati Brissia Delisa, PT, DPT 08/30/2015 Pager: [phone]

## 2015-08-30 NOTE — Telephone Encounter (Signed)
Patient's son Onalee HuaDavid is calling and states his [facility] and is being admitted for tests. He fell and his BP was too high.  Son just wanted to let you know as you are his doctor. Thanks!

## 2015-08-30 NOTE — Progress Notes (Signed)
[facility] PROGRESS NOTE  MCGREGOR TINNON ZOX:[id number] DOB: June 29, 1927 DOA: 08/29/2015 PCP: Lupe Carney, MD   Brief narrative 82 year old male with history of? Advanced Parkinson's disease with dementia brought by EMS after sustaining a fall at home. As per wife and son patient has had gait instability and several falls over the past few weeks .patient had another fall on the day of admission, EMS was called and en route was found to be lethargic.  Assessment/Plan: Acute encephalopathy Likely metabolic with hyperammonemia, ?progressive Parkinson symptoms . No clinical signs of infection. Mental status slowly improving. CT head and cervical spine unremarkable. Continue neuro checks. Avoid narcotics or benzos.. As per wife patient did not get any pain medications. Ordered lactulose enema. Monitor ammonia level. Keep strict nothing by mouth until speech evaluation given concern for aspiration.  Cough Chest x-ray unremarkable. Will order frequent suction. Swallow evaluation. Keep strict nothing by mouth.  Parkinson's disease with frequent falls Patient has waxing and waning mental status at baseline but is mostly well conversant. Was given a walker recently but patient not very compliant. Has right fourth and fifth rib fracture which is reported as chronic (possibly from recent fall)  Receives PT at home. Continue fall precautions. PT evaluation. Hold  Sinemet until able to take by mouth    DVT prophylaxis: Subcutaneous Lovenox  Code Status: full code Family Communication: spoke with wife and son on the phone Disposition Plan: continue inpt monitoring   Consultants:  none  Procedures:  Head CT  Antibiotics:  none  HPI/Subjective: Seen and examined. Patient more alert and trying to answer questions, has congested cough  Objective: Filed Vitals:   08/29/15 2202 08/30/15 0631  BP: 171/64 155/56  Pulse:  59  Temp:  98.2 F (36.8 C)  Resp:  18     Intake/Output Summary (Last 24 hours) at 08/30/15 1056 Last data filed at 08/30/15 0700  Gross per 24 hour  Intake  797.5 ml  Output      0 ml  Net  797.5 ml   Filed Weights   08/29/15 1633 08/29/15 2136  Weight: 72.576 kg (160 lb) 72.7 kg (160 lb 4.4 oz)    Exam:   General: Elderly male lying in bed sleepy but arousable, responds to simple commands  HEENT: No pallor, moist mucosa, supple neck  Chest: Clear bilaterally  Cardiovascular: Normal S1 and S2, no murmurs rub or gallop  GI: Soft, nondistended, nontender, bowel sounds present  Musculoskeletal: Warm, no edema neck  CNS: Awake to commands, answers few questions, moving all extremities  Data Reviewed: Basic Metabolic Panel:  Recent Labs Lab 08/29/15 1635 08/30/15 0450  NA 142 141  K 4.6 4.0  CL 107 108  CO2 26 25  GLUCOSE 110* 114*  BUN 23* 20  CREATININE 1.14 1.09  CALCIUM 9.9 9.3   Liver Function Tests:  Recent Labs Lab 08/30/15 0450  AST 20  ALT 14*  ALKPHOS 54  BILITOT 0.9  PROT 6.0*  ALBUMIN 3.3*   No results for input(s): LIPASE, AMYLASE in the last 168 hours.  Recent Labs Lab 08/29/15 2041  AMMONIA 137*   CBC:  Recent Labs Lab 08/29/15 1635 08/30/15 0450  WBC 11.1* 8.5  NEUTROABS  --  5.5  HGB 13.1 11.6*  HCT 39.1 35.3*  MCV 89.5 91.2  PLT 272 251   Cardiac Enzymes: No results for input(s): CKTOTAL, CKMB, CKMBINDEX, TROPONINI in the last 168 hours. BNP (last 3 results) No results for input(s): BNP in the  last 8760 hours.  ProBNP (last 3 results) No results for input(s): PROBNP in the last 8760 hours.  CBG:  Recent Labs Lab 08/29/15 1639 08/30/15 0015 08/30/15 0727  GLUCAP 88 109* 105*    No results found for this or any previous visit (from the past 240 hour(s)).   Studies: Dg Pelvis 1-2 Views  08/29/2015  CLINICAL DATA:  Fall, unwitnessed. EXAM: PELVIS - 1-2 VIEW COMPARISON:  None. FINDINGS: No fracture or diastasis in the pelvis. No evidence of hip  dislocation on this frontal view. Degenerative changes in the visualized lower lumbar spine. Diffuse osteopenia. No focal osseous lesions. IMPRESSION: No fracture or diastasis in the pelvis.  Diffuse osteopenia. Electronically Signed   By: Delbert PhenixJason A Poff M.D.   On: 08/29/2015 18:02   Ct Head Wo Contrast  08/29/2015  CLINICAL DATA:  Patient status post fall. History of Parkinson's. Confusion. No reported loss consciousness. EXAM: CT HEAD WITHOUT CONTRAST CT CERVICAL SPINE WITHOUT CONTRAST TECHNIQUE: Multidetector CT imaging of the head and cervical spine was performed following the standard protocol without intravenous contrast. Multiplanar CT image reconstructions of the cervical spine were also generated. COMPARISON:  MRI brain 07/08/2009 FINDINGS: CT HEAD FINDINGS Ventricles and sulci are appropriate for patient's age. Periventricular and subcortical white matter hypodensity compatible with chronic microvascular ischemic changes. Orbits are unremarkable. Scalp soft tissues are unremarkable. Paranasal sinuses are well aerated. Mastoid air cells are unremarkable. Calvarium is intact. CT CERVICAL SPINE FINDINGS Normal anatomic alignment. Osseous fusion of the C2-3 and C3-4 levels. Additionally there is grade 1 anterolisthesis of C6-7 likely secondary to facet degenerative changes and fusion at this level. There is right-sided facet fusion at the C6-7 and C7-T1 levels. The craniocervical junction is intact. Relative preservation of the vertebral body heights. IMPRESSION: No acute intracranial process. No acute cervical spine fracture. Chronic microvascular ischemic changes. Multilevel cervical spinal fusion and associated degenerative changes. Electronically Signed   By: Annia Beltrew  Davis M.D.   On: 08/29/2015 18:14   Ct Cervical Spine Wo Contrast  08/29/2015  CLINICAL DATA:  Patient status post fall. History of Parkinson's. Confusion. No reported loss consciousness. EXAM: CT HEAD WITHOUT CONTRAST CT CERVICAL SPINE  WITHOUT CONTRAST TECHNIQUE: Multidetector CT imaging of the head and cervical spine was performed following the standard protocol without intravenous contrast. Multiplanar CT image reconstructions of the cervical spine were also generated. COMPARISON:  MRI brain 07/08/2009 FINDINGS: CT HEAD FINDINGS Ventricles and sulci are appropriate for patient's age. Periventricular and subcortical white matter hypodensity compatible with chronic microvascular ischemic changes. Orbits are unremarkable. Scalp soft tissues are unremarkable. Paranasal sinuses are well aerated. Mastoid air cells are unremarkable. Calvarium is intact. CT CERVICAL SPINE FINDINGS Normal anatomic alignment. Osseous fusion of the C2-3 and C3-4 levels. Additionally there is grade 1 anterolisthesis of C6-7 likely secondary to facet degenerative changes and fusion at this level. There is right-sided facet fusion at the C6-7 and C7-T1 levels. The craniocervical junction is intact. Relative preservation of the vertebral body heights. IMPRESSION: No acute intracranial process. No acute cervical spine fracture. Chronic microvascular ischemic changes. Multilevel cervical spinal fusion and associated degenerative changes. Electronically Signed   By: Annia Beltrew  Davis M.D.   On: 08/29/2015 18:14   Portable Chest 1 View  08/29/2015  CLINICAL DATA:  Patient with multiple unwitnessed falls. Right-sided rib pain. Initial encounter. EXAM: PORTABLE CHEST 1 VIEW COMPARISON:  Chest radiograph 07/08/2009 FINDINGS: Monitoring leads overlie the patient. Low lung volumes. Stable cardiac and mediastinal contours. Minimal heterogeneous opacities left lung base.  No pleural effusion or pneumothorax. Age-indeterminate but likely chronic right fourth and fifth rib fractures. IMPRESSION: Age-indeterminate but likely chronic right fourth and fifth rib fractures. Low lung volumes with basilar opacities favored to represent atelectasis. Electronically Signed   By: Annia Belt M.D.   On:  08/29/2015 20:57    Scheduled Meds: . aspirin  325 mg Oral Daily  . carbidopa-levodopa  1.5 tablet Oral TID  . enoxaparin (LOVENOX) injection  40 mg Subcutaneous QHS   Continuous Infusions: . dextrose 5 % and 0.45% NaCl 75 mL/hr at 08/30/15 0845    Active Problems:   Altered mental state    Time spent: 25 minutes    Ares Tegtmeyer  [facility] Pager [phone] If 7PM-7AM, please contact night-coverage at [email or web address], password [facility] 08/30/2015, 10:56 AM  LOS: 1 day

## 2015-08-31 ENCOUNTER — Inpatient Hospital Stay (HOSPITAL_COMMUNITY): Payer: Medicare Other

## 2015-08-31 DIAGNOSIS — F0391 Unspecified dementia with behavioral disturbance: Secondary | ICD-10-CM

## 2015-08-31 LAB — LACTIC ACID, PLASMA: Lactic Acid, Venous: 0.9 mmol/L (ref 0.5–2.0)

## 2015-08-31 LAB — CBC WITH DIFFERENTIAL/PLATELET
BASOS ABS: 0 10*3/uL (ref 0.0–0.1)
BASOS PCT: 0 %
EOS ABS: 0 10*3/uL (ref 0.0–0.7)
Eosinophils Relative: 0 %
HEMATOCRIT: 34.2 % — AB (ref 39.0–52.0)
HEMOGLOBIN: 11.4 g/dL — AB (ref 13.0–17.0)
Lymphocytes Relative: 6 %
Lymphs Abs: 0.9 10*3/uL (ref 0.7–4.0)
MCH: 30.3 pg (ref 26.0–34.0)
MCHC: 33.3 g/dL (ref 30.0–36.0)
MCV: 91 fL (ref 78.0–100.0)
MONOS PCT: 5 %
Monocytes Absolute: 0.8 10*3/uL (ref 0.1–1.0)
NEUTROS ABS: 14.2 10*3/uL — AB (ref 1.7–7.7)
NEUTROS PCT: 89 %
Platelets: 215 10*3/uL (ref 150–400)
RBC: 3.76 MIL/uL — AB (ref 4.22–5.81)
RDW: 13.2 % (ref 11.5–15.5)
WBC: 16 10*3/uL — AB (ref 4.0–10.5)

## 2015-08-31 LAB — COMPREHENSIVE METABOLIC PANEL
ALBUMIN: 3.2 g/dL — AB (ref 3.5–5.0)
ALK PHOS: 61 U/L (ref 38–126)
ALT: 7 U/L — AB (ref 17–63)
ANION GAP: 9 (ref 5–15)
AST: 33 U/L (ref 15–41)
BILIRUBIN TOTAL: 0.6 mg/dL (ref 0.3–1.2)
BUN: 30 mg/dL — AB (ref 6–20)
CALCIUM: 9.3 mg/dL (ref 8.9–10.3)
CO2: 23 mmol/L (ref 22–32)
Chloride: 104 mmol/L (ref 101–111)
Creatinine, Ser: 1.39 mg/dL — ABNORMAL HIGH (ref 0.61–1.24)
GFR calc Af Amer: 51 mL/min — ABNORMAL LOW (ref >60)
GFR calc non Af Amer: 44 mL/min — ABNORMAL LOW (ref >60)
GLUCOSE: 126 mg/dL — AB (ref 65–99)
Potassium: 4.1 mmol/L (ref 3.5–5.1)
SODIUM: 136 mmol/L (ref 135–145)
TOTAL PROTEIN: 6.1 g/dL — AB (ref 6.5–8.1)

## 2015-08-31 LAB — URINALYSIS, ROUTINE W REFLEX MICROSCOPIC
GLUCOSE, UA: NEGATIVE mg/dL
Ketones, ur: NEGATIVE mg/dL
NITRITE: POSITIVE — AB
Protein, ur: 100 mg/dL — AB
SPECIFIC GRAVITY, URINE: 1.027 (ref 1.005–1.030)
pH: 5 (ref 5.0–8.0)

## 2015-08-31 LAB — GLUCOSE, CAPILLARY
GLUCOSE-CAPILLARY: 134 mg/dL — AB (ref 65–99)
Glucose-Capillary: 116 mg/dL — ABNORMAL HIGH (ref 65–99)
Glucose-Capillary: 124 mg/dL — ABNORMAL HIGH (ref 65–99)
Glucose-Capillary: 126 mg/dL — ABNORMAL HIGH (ref 65–99)
Glucose-Capillary: 156 mg/dL — ABNORMAL HIGH (ref 65–99)

## 2015-08-31 LAB — URINE CULTURE: CULTURE: NO GROWTH

## 2015-08-31 LAB — URINE MICROSCOPIC-ADD ON

## 2015-08-31 LAB — AMMONIA: Ammonia: 26 umol/L (ref 9–35)

## 2015-08-31 MED ORDER — SODIUM CHLORIDE 0.9 % IV SOLN: INTRAVENOUS | Status: DC

## 2015-08-31 MED ORDER — SODIUM CHLORIDE 0.9 % IV BOLUS (SEPSIS)
500.0000 mL | Freq: Once | INTRAVENOUS | Status: AC
  Administered 2015-08-31: 500 mL via INTRAVENOUS

## 2015-08-31 MED ORDER — OXYBUTYNIN CHLORIDE 5 MG PO TABS
5.0000 mg | ORAL_TABLET | Freq: Every day | ORAL | Status: DC
  Administered 2015-08-31 – 2015-09-04 (×4): 5 mg via ORAL
  Filled 2015-08-31 (×5): qty 1

## 2015-08-31 MED ORDER — SODIUM CHLORIDE 0.9 % IV SOLN
INTRAVENOUS | Status: DC
  Administered 2015-08-31: 23:00:00 via INTRAVENOUS

## 2015-08-31 MED ORDER — SODIUM CHLORIDE 0.9 % IV BOLUS (SEPSIS)
250.0000 mL | Freq: Once | INTRAVENOUS | Status: AC
  Administered 2015-08-31: 250 mL via INTRAVENOUS

## 2015-08-31 MED ORDER — DEXTROSE 5 % IV SOLN
2.0000 g | INTRAVENOUS | Status: DC
  Administered 2015-08-31 – 2015-09-02 (×3): 2 g via INTRAVENOUS
  Filled 2015-08-31 (×3): qty 2

## 2015-08-31 MED ORDER — ACETAMINOPHEN 325 MG PO TABS
650.0000 mg | ORAL_TABLET | Freq: Four times a day (QID) | ORAL | Status: DC | PRN
  Administered 2015-08-31: 650 mg via ORAL
  Filled 2015-08-31: qty 2

## 2015-08-31 MED ORDER — DONEPEZIL HCL 10 MG PO TABS
10.0000 mg | ORAL_TABLET | Freq: Every day | ORAL | Status: DC
  Administered 2015-08-31 – 2015-09-04 (×4): 10 mg via ORAL
  Filled 2015-08-31 (×5): qty 1

## 2015-08-31 NOTE — Progress Notes (Signed)
Pt has only had 250cc's of urine output over the last 10 hours.  MD made aware.  Will continue to monitor closely.

## 2015-08-31 NOTE — Progress Notes (Signed)
Speech Language Pathology Treatment: Dysphagia  Patient Details Name: Michael BackerBobby R [name] MRN: [id number] DOB: Oct 03, 1927 Today's Date: 08/31/2015 Time: [id number] SLP Time Calculation (min) (ACUTE ONLY): 9 min  Assessment / Plan / Recommendation Clinical Impression  Pt seen to assess po tolerance and educate pt/family.  Currently pt is sleepy - sitting upright in chair - leaning to left.  Pt was too lethargic to consume intake but did answer a few questions.  He admits to coughing with grits this am but denies any difficulties with other items - spouse confirms.   Spouse also admits to only giving pt liquids via tsp as recommended on evaluation yesterday.  Using teach back, reviewed with pt/spouse increased pna risk associated with aspiration with pt/spouse.  Discussed need to clear throat/cough if voice wet/gurgly - to which Michael RhodesBetty stated, "We had to do that at home."  Suspect some chronic dysphagia due to Parkinson's disease.    Recommend follow [facility] for dysphagia management.  Will continue to follow, recommend continue dys3/nectar, ice chip diet.    HPI HPI: pt is an 82 yo male with h/p Parkinson's disease admitted to hospital with AMS, acute encephalopathy, lethargy.  RN reports pt's ammonia levels are off.  Pt has had multiple recent falls- has rib fractures.  CXR 4/16 favors ATX,  CT head negative for acute change.        SLP Plan  Continue with current plan of care     Recommendations  Diet recommendations: Dysphagia 3 (mechanical soft);Nectar-thick liquid Liquids provided via: Teaspoon Medication Administration: Whole meds with puree Compensations: Minimize environmental distractions;Slow rate;Small sips/bites (cue pt to clear throat/cough if voice gurgly)             Oral Care Recommendations: Oral care QID Follow up Recommendations: Home health SLP Plan: Continue with current plan of care     GO                Michael Burnetamara Valiant Dills, MS [facility] SLP [phone]

## 2015-08-31 NOTE — Evaluation (Signed)
Physical Therapy Evaluation Patient Details Name: Michael [name] MRN: [id number] DOB: [birth year] Today's Date: 08/31/2015   History of Present Illness  82 yo male with history of PD, early dementia admitted after fall at home and presents with acute encephalopathy  Clinical Impression  Pt admitted with above diagnosis. Pt currently with functional limitations due to the deficits listed below (see PT Problem List).  Pt will benefit from skilled PT to increase their independence and safety with mobility to allow discharge to the venue listed below.   Pt assisted OOB to recliner today requiring at least mod assist.  RN in to assist with obtaining orthostatics during mobility.  Spouse wishes to bring pt home however reports multiple falls prior to admission and does not appear physically capable of assisting pt.  Pt would benefit from ST-SNF upon d/c.     Follow Up Recommendations SNF;Supervision/Assistance - 24 hour    Equipment Recommendations  None recommended by PT    Recommendations for Other Services       Precautions / Restrictions Precautions Precautions: Fall      Mobility  Bed Mobility Overal bed mobility: Needs Assistance Bed Mobility: Supine to Sit     Supine to sit: Max assist;+2 for physical assistance;HOB elevated     General bed mobility comments: assist for LEs over EOB, trunk upright and scooting hips to EOB utilizing bed pad  Transfers Overall transfer level: Needs assistance Equipment used: Rolling walker (2 wheeled) Transfers: Sit to/from [employer] Sit to Stand: Mod assist;+2 safety/equipment Stand pivot transfers: Min assist;+2 safety/equipment       General transfer comment: verbal cues for safe technique, assist to rise and steady, manual cues for hand placement to assist controlling descent  Ambulation/Gait                Stairs            Wheelchair Mobility    Modified Rankin (Stroke Patients Only)        Balance Overall balance assessment: History of Falls                                           Pertinent Vitals/Pain Pain Assessment: No/denies pain    Home Living Family/patient expects to be discharged to:: Private residence Living Arrangements: Spouse/significant other           Home Layout: One level Home Equipment: Environmental consultant - 4 wheels      Prior Function Level of Independence: Needs assistance   Gait / Transfers Assistance Needed: spouse supervises, ambulates with rollator           Hand Dominance        Extremity/Trunk Assessment               Lower Extremity Assessment: Generalized weakness;LLE deficits/detail   LLE Deficits / Details: pt states L LE is weaker and buckles     Communication   Communication: No difficulties  Cognition Arousal/Alertness: Awake/alert Behavior During Therapy: WFL for tasks assessed/performed   Area of Impairment: Following commands       Following Commands: Follows one step commands consistently       General Comments: presents close to baseline    General Comments      Exercises        Assessment/Plan    PT Assessment Patient needs continued PT services  PT Diagnosis Difficulty  walking   PT Problem List Decreased strength;Decreased activity tolerance;Decreased balance;Decreased mobility  PT Treatment Interventions DME instruction;Gait training;Functional mobility training;Patient/family education;Therapeutic activities;Therapeutic exercise;Balance training   PT Goals (Current goals can be found in the Care Plan section) Acute Rehab PT Goals PT Goal Formulation: With patient/family Time For Goal Achievement: 09/14/15 Potential to Achieve Goals: Good    Frequency Min 3X/week   Barriers to discharge        Co-evaluation               End of Session Equipment Utilized During Treatment: Gait belt Activity Tolerance: Patient tolerated treatment well Patient left: in  chair;with call bell/phone within reach;with chair alarm set;with nursing/sitter in room Nurse Communication: Mobility status         Time: [id number] PT Time Calculation (min) (ACUTE ONLY): 23 min   Charges:   PT Evaluation $PT Eval Moderate Complexity: 1 Procedure     PT G Codes:        Lilyrose Tanney,KATHrine E 08/31/2015, 12:10 PM Zenovia JarredKati Aryannah Mohon, PT, DPT 08/31/2015 Pager: [phone]

## 2015-08-31 NOTE — NC FL2 (Signed)
  [place] MEDICAID FL2 LEVEL OF CARE SCREENING TOOL     IDENTIFICATION  Patient Name: Michael [name] Birthdate: [birth year] Sex: male Admission Date (Current Location): 08/29/2015  [place]   Facility and Address:  [facility],  [address], [place] [id number]      Provider Number: [id number]  Attending Physician Name and Address:  Maretta BeesShanker M Ghimire, MD  Relative Name and Phone Number:       Current Level of Care: Hospital Recommended Level of Care: Skilled Nursing Facility Prior Approval Number:    Date Approved/Denied:   PASRR Number: [id number]  Discharge Plan: Home    Current Diagnoses: Patient Active Problem List   Diagnosis Date Noted  . Cough 08/30/2015  . Acute encephalopathy 08/30/2015  . Fall 08/30/2015  . Failure to thrive (0-17) 08/30/2015  . Hyperammonemia (HCC) 08/30/2015  . Altered mental state 08/29/2015  . Parkinson's disease (HCC) 03/27/2013  . Other B-complex deficiencies 09/16/2012    Orientation RESPIRATION BLADDER Height & Weight     Self, Time, Situation, Place  O2 (2L) Incontinent, External catheter Weight: 160 lb 4.4 oz (72.7 kg) Height:  5\' 2"  (157.5 cm)  BEHAVIORAL SYMPTOMS/MOOD NEUROLOGICAL BOWEL NUTRITION STATUS      Continent Diet (Dysphasia 3 Diet)  AMBULATORY STATUS COMMUNICATION OF NEEDS Skin   Extensive Assist Verbally Normal                       Personal Care Assistance Level of Assistance  Bathing, Dressing Bathing Assistance: Limited assistance   Dressing Assistance: Limited assistance     Functional Limitations Info             SPECIAL CARE FACTORS FREQUENCY  PT (By licensed PT), OT (By licensed OT)     PT Frequency: 5 OT Frequency: 5            Contractures      Additional Factors Info  Code Status, Allergies Code Status Info: Fullcode Allergies Info: NKDA           Current Medications (08/31/2015):  This is the current hospital active  medication list Current Facility-Administered Medications  Medication Dose Route Frequency Provider Last Rate Last Dose  . aspirin tablet 325 mg  325 mg Oral Daily Eston EstersAhmad Hamad, MD   325 mg at 08/31/15 1041  . carbidopa-levodopa (SINEMET IR) 25-100 MG per tablet immediate release 1.5 tablet  1.5 tablet Oral TID Eston EstersAhmad Hamad, MD   1.5 tablet at 08/31/15 1041  . donepezil (ARICEPT) tablet 10 mg  10 mg Oral QHS Shanker Levora DredgeM Ghimire, MD      . enoxaparin (LOVENOX) injection 40 mg  40 mg Subcutaneous QHS Eston EstersAhmad Hamad, MD   40 mg at 08/30/15 2254  . oxybutynin (DITROPAN) tablet 5 mg  5 mg Oral QHS Shanker Levora DredgeM Ghimire, MD      . RESOURCE THICKENUP CLEAR   Oral PRN Eddie NorthNishant Dhungel, MD         Discharge Medications: Please see discharge summary for a list of discharge medications.  Relevant Imaging Results:  Relevant Lab Results:   Additional Information SSN: [id number]  Arlyss RepressHarrison, Katelyne Galster F, LCSW

## 2015-08-31 NOTE — Clinical Social Work Note (Signed)
Clinical Social Work Assessment  Patient Details  Name: Michael [name] MRN: [id number] Date of Birth: 03/06/1928  Date of referral:  08/31/15               Reason for consult:  Facility Placement                Permission sought to share information with:  Oceanographer[place]ility Contact Representative Permission granted to share information::  Yes, Verbal Permission Granted  Name::        Agency::     Relationship::     Contact Information:     Housing/Transportation Living arrangements for the past 2 months:  Single Family Home Source of Information:  Adult Children Patient Interpreter Needed:  None (grand-daughter, Michael [name] at bedside) Criminal Activity/Legal Involvement Pertinent to Current Situation/Hospitalization:  No - Comment as needed Significant Relationships:  Adult Children, Spouse (grand-children & great-grand children) Lives with:  Spouse Do you feel safe going back to the place where you live?  No Need for family participation in patient care:  Yes (Comment)  Care giving concerns:  CSW reviewed PT evaluation recommending SNF at discharge.    Social Worker assessment / plan:  CSW spoke with patient's grand-daughter, Michael [name] at bedside & great-granddaughter re: discharge planning. Patient's wife was originally not in agreement with plan for SNF, but has since changed her mind and is open to patient going to rehab for short term rehab.   Employment status:  Retired Database administratornsurance information:  Managed Medicare PT Recommendations:  Skilled Nursing Facility Information / Referral to community resources:  Skilled Nursing Facility  Patient/Family's Response to care:  [place] and would [facility]. [facility], awaiting call back re: bed availability.   Patient/Family's Understanding of and Emotional Response to Diagnosis, Current Treatment, and Prognosis:    Emotional Assessment Appearance:    Attitude/Demeanor/Rapport:     Affect (typically observed):    Orientation:  Oriented to Self Alcohol / Substance use:    Psych involvement (Current and /or in the community):     Discharge Needs  Concerns to be addressed:    Readmission within the last 30 days:    Current discharge risk:    Barriers to Discharge:      Michael [name], Michael Umholtz F, LCSW 08/31/2015, 3:50 PM

## 2015-08-31 NOTE — Progress Notes (Signed)
Pt. Had temp of 102 axillary. Unable to obtain proper oral temp. No acetaminophen ordered. MD made aware.

## 2015-08-31 NOTE — Progress Notes (Signed)
Pharmacy Antibiotic Note  Michael BackerBobby R [name] is a 82 y.o. male admitted on 08/29/2015 with HCAP.  Pharmacy has been consulted for cefepime dosing.  Plan: Cefepime 2 gr IV q24h  Height: 5\' 2"  (157.5 cm) Weight: 160 lb 4.4 oz (72.7 kg) IBW/kg (Calculated) : 54.6  Temp (24hrs), Avg:100.2 F (37.9 C), Min:97.6 F (36.4 C), Max:102 F (38.9 C)   Recent Labs Lab 08/29/15 1635 08/30/15 0450 08/31/15 2013  WBC 11.1* 8.5 16.0*  CREATININE 1.14 1.09 1.39*  LATICACIDVEN  --   --  0.9    Estimated Creatinine Clearance: 33.3 mL/min (by C-G formula based on Cr of 1.39).    No Known Allergies  Antimicrobials this admission: 4/18 cefepime  >>    Dose adjustments this admission: ----  Microbiology results: 4/18 BCx: sent 4/16 UCx: NGF  Thank you for allowing pharmacy to be a part of this patient's care.   Adalberto ColeNikola Britlyn Martine, PharmD, BCPS Pager [phone] 08/31/2015 10:25 PM

## 2015-08-31 NOTE — Progress Notes (Signed)
Patient had a rectal temperature of 102 F. PCP on call notified.

## 2015-08-31 NOTE — Clinical Social Work Placement (Signed)
   CLINICAL SOCIAL WORK PLACEMENT  NOTE  Date:  08/31/2015  Patient Details  Name: Fabiola BackerBobby R Battiste MRN: [id number] Date of Birth: 22-Dec-1927  Clinical Social Work is seeking post-discharge placement for this patient at the Skilled  Nursing Facility level of care (*CSW will initial, date and re-position this form in  chart as items are completed):  Yes   Patient/family provided with [place] Clinical Social Work Department's list of facilities offering this level of care within the geographic area requested by the patient (or if unable, by the patient's family).  Yes   Patient/family informed of their freedom to choose among providers that offer the needed level of care, that participate in Medicare, Medicaid or managed care program needed by the patient, have an available bed and are willing to accept the patient.  Yes   Patient/family informed of [facility]'s ownership interest in [facility] and [facility], as well as of the fact that they are under no obligation to receive care at these facilities.  PASRR submitted to EDS on 08/31/15     PASRR number received on 08/31/15     Existing PASRR number confirmed on       FL2 transmitted to all facilities in geographic area requested by pt/family on 08/31/15     FL2 transmitted to all facilities within larger geographic area on       Patient informed that his/her managed care company has contracts with or will negotiate with certain facilities, including the following:            Patient/family informed of bed offers received.  Patient chooses bed at       Physician recommends and patient chooses bed at      Patient to be transferred to   on  .  Patient to be transferred to facility by       Patient family notified on   of transfer.  Name of family member notified:        PHYSICIAN       Additional Comment:    _______________________________________________ Arlyss RepressHarrison, Haidyn Chadderdon F, LCSW 08/31/2015, 3:53 PM

## 2015-08-31 NOTE — Progress Notes (Signed)
[facility] PROGRESS NOTE  NYRON MOZER ZOX:[id number] DOB: Jan 12, 1928 DOA: 08/29/2015 PCP: Lupe Carney, MD  Assessment/Plan: Acute encephalopathy: seems to be improving. Likely secondary to hyperammoniemia- etiology of which is uncertain at this time. Suspect has dementia related to parkinson's at baseline. Currently calm-follows may commands-answers questions with 1-2 words sentences.CT head negative, CXR/UA negative for infection as well. Continue supportive care, Ammonia levels have normalized. Await PT eval-suspect may require SNF.'  Frequent Falls:suspect related to parkinson's disease. Check Orthostatics if able. Await PT eval-suspect will require SNF. CT Head/CT C Spine negative for significant abnormalities.  Parkinson's disease:continue Sinemet.  Dementia:will resume Aricept.Suspect dementia is related to Parkinson's disease. Currently following commands-answers simple questions yes/no.No family at bedside-but suspect close to his usual baseline.  Dysphagia: seen by SLP-started on Dys 3 diet. Suspect has sialorrhea from Parkinson's contributing to dysphagia/drooling. Will await arrival of family and will discuss aspiration risk  DVT prophylaxis: Lovenox Code Status: Full Code Family Communication: None at bedside Disposition Plan: SNF vs Home health services.    Consultants:  None  Procedures:  None  Antibiotics:  None   HPI/Subjective: Sleeping comfortably-easily awakens, follows commands-answers simple questions yes or no.  Objective: Filed Vitals:   08/30/15 2016 08/31/15 0543  BP: 152/54 107/56  Pulse: 72 76  Temp: 99.1 F (37.3 C) 97.8 F (36.6 C)  Resp: 20 22    Intake/Output Summary (Last 24 hours) at 08/31/15 0804 Last data filed at 08/31/15 0600  Gross per 24 hour  Intake   1725 ml  Output    550 ml  Net   1175 ml   Filed Weights   08/29/15 1633 08/29/15 2136  Weight: 72.576 kg (160 lb) 72.7 kg (160 lb 4.4 oz)     Exam:   General:  Elderly male lying in bed sleepy but arousable, responds to simple commands  Cardiovascular: Normal S1 and S2, no murmurs rub or gallop  Respiratory:transmitted upper airway sounds from pooling secretions-but otherwise clear.  Abdomen: Soft, nondistended, nontender, bowel sounds present  CNS:+tremors-gen weakness-but non focal   Data Reviewed: Basic Metabolic Panel:  Recent Labs Lab 08/29/15 1635 08/30/15 0450  NA 142 141  K 4.6 4.0  CL 107 108  CO2 26 25  GLUCOSE 110* 114*  BUN 23* 20  CREATININE 1.14 1.09  CALCIUM 9.9 9.3   Liver Function Tests:  Recent Labs Lab 08/30/15 0450  AST 20  ALT 14*  ALKPHOS 54  BILITOT 0.9  PROT 6.0*  ALBUMIN 3.3*   No results for input(s): LIPASE, AMYLASE in the last 168 hours.  Recent Labs Lab 08/29/15 2041 08/31/15 0459  AMMONIA 137* 26   CBC:  Recent Labs Lab 08/29/15 1635 08/30/15 0450  WBC 11.1* 8.5  NEUTROABS  --  5.5  HGB 13.1 11.6*  HCT 39.1 35.3*  MCV 89.5 91.2  PLT 272 251   Cardiac Enzymes: No results for input(s): CKTOTAL, CKMB, CKMBINDEX, TROPONINI in the last 168 hours. BNP (last 3 results) No results for input(s): BNP in the last 8760 hours.  ProBNP (last 3 results) No results for input(s): PROBNP in the last 8760 hours.  CBG:  Recent Labs Lab 08/30/15 1153 08/30/15 1802 08/30/15 2349 08/31/15 0541 08/31/15 0732  GLUCAP 104* 137* 94 124* 156*    No results found for this or any previous visit (from the past 240 hour(s)).   Studies: Dg Pelvis 1-2 Views  08/29/2015  CLINICAL DATA:  Fall, unwitnessed. EXAM: PELVIS - 1-2 VIEW COMPARISON:  None.  FINDINGS: No fracture or diastasis in the pelvis. No evidence of hip dislocation on this frontal view. Degenerative changes in the visualized lower lumbar spine. Diffuse osteopenia. No focal osseous lesions. IMPRESSION: No fracture or diastasis in the pelvis.  Diffuse osteopenia. Electronically Signed   By: Delbert PhenixJason A Poff M.D.    On: 08/29/2015 18:02   Ct Head Wo Contrast  08/29/2015  CLINICAL DATA:  Patient status post fall. History of Parkinson's. Confusion. No reported loss consciousness. EXAM: CT HEAD WITHOUT CONTRAST CT CERVICAL SPINE WITHOUT CONTRAST TECHNIQUE: Multidetector CT imaging of the head and cervical spine was performed following the standard protocol without intravenous contrast. Multiplanar CT image reconstructions of the cervical spine were also generated. COMPARISON:  MRI brain 07/08/2009 FINDINGS: CT HEAD FINDINGS Ventricles and sulci are appropriate for patient's age. Periventricular and subcortical white matter hypodensity compatible with chronic microvascular ischemic changes. Orbits are unremarkable. Scalp soft tissues are unremarkable. Paranasal sinuses are well aerated. Mastoid air cells are unremarkable. Calvarium is intact. CT CERVICAL SPINE FINDINGS Normal anatomic alignment. Osseous fusion of the C2-3 and C3-4 levels. Additionally there is grade 1 anterolisthesis of C6-7 likely secondary to facet degenerative changes and fusion at this level. There is right-sided facet fusion at the C6-7 and C7-T1 levels. The craniocervical junction is intact. Relative preservation of the vertebral body heights. IMPRESSION: No acute intracranial process. No acute cervical spine fracture. Chronic microvascular ischemic changes. Multilevel cervical spinal fusion and associated degenerative changes. Electronically Signed   By: Annia Beltrew  Davis M.D.   On: 08/29/2015 18:14   Ct Cervical Spine Wo Contrast  08/29/2015  CLINICAL DATA:  Patient status post fall. History of Parkinson's. Confusion. No reported loss consciousness. EXAM: CT HEAD WITHOUT CONTRAST CT CERVICAL SPINE WITHOUT CONTRAST TECHNIQUE: Multidetector CT imaging of the head and cervical spine was performed following the standard protocol without intravenous contrast. Multiplanar CT image reconstructions of the cervical spine were also generated. COMPARISON:  MRI brain  07/08/2009 FINDINGS: CT HEAD FINDINGS Ventricles and sulci are appropriate for patient's age. Periventricular and subcortical white matter hypodensity compatible with chronic microvascular ischemic changes. Orbits are unremarkable. Scalp soft tissues are unremarkable. Paranasal sinuses are well aerated. Mastoid air cells are unremarkable. Calvarium is intact. CT CERVICAL SPINE FINDINGS Normal anatomic alignment. Osseous fusion of the C2-3 and C3-4 levels. Additionally there is grade 1 anterolisthesis of C6-7 likely secondary to facet degenerative changes and fusion at this level. There is right-sided facet fusion at the C6-7 and C7-T1 levels. The craniocervical junction is intact. Relative preservation of the vertebral body heights. IMPRESSION: No acute intracranial process. No acute cervical spine fracture. Chronic microvascular ischemic changes. Multilevel cervical spinal fusion and associated degenerative changes. Electronically Signed   By: Annia Beltrew  Davis M.D.   On: 08/29/2015 18:14   Portable Chest 1 View  08/29/2015  CLINICAL DATA:  Patient with multiple unwitnessed falls. Right-sided rib pain. Initial encounter. EXAM: PORTABLE CHEST 1 VIEW COMPARISON:  Chest radiograph 07/08/2009 FINDINGS: Monitoring leads overlie the patient. Low lung volumes. Stable cardiac and mediastinal contours. Minimal heterogeneous opacities left lung base. No pleural effusion or pneumothorax. Age-indeterminate but likely chronic right fourth and fifth rib fractures. IMPRESSION: Age-indeterminate but likely chronic right fourth and fifth rib fractures. Low lung volumes with basilar opacities favored to represent atelectasis. Electronically Signed   By: Annia Beltrew  Davis M.D.   On: 08/29/2015 20:57    Scheduled Meds: . aspirin  325 mg Oral Daily  . carbidopa-levodopa  1.5 tablet Oral TID  . enoxaparin (LOVENOX) injection  40 mg Subcutaneous QHS   Continuous Infusions:   Principal Problem:   Acute encephalopathy Active Problems:    Parkinson's disease (HCC)   Altered mental state   Cough   Fall   Failure to thrive (0-17)   Hyperammonemia (HCC)   Time spent: 30  [facility]  [facility]  If 7PM-7AM, please contact night-coverage at [email or web address], password [facility] 08/31/2015, 8:04 AM  LOS: 2 days

## 2015-09-01 ENCOUNTER — Inpatient Hospital Stay (HOSPITAL_COMMUNITY): Payer: Medicare Other

## 2015-09-01 DIAGNOSIS — G20 Parkinson's disease: Secondary | ICD-10-CM

## 2015-09-01 DIAGNOSIS — N390 Urinary tract infection, site not specified: Secondary | ICD-10-CM

## 2015-09-01 DIAGNOSIS — G9341 Metabolic encephalopathy: Secondary | ICD-10-CM

## 2015-09-01 DIAGNOSIS — R1310 Dysphagia, unspecified: Secondary | ICD-10-CM

## 2015-09-01 DIAGNOSIS — J189 Pneumonia, unspecified organism: Secondary | ICD-10-CM | POA: Clinically undetermined

## 2015-09-01 DIAGNOSIS — F0390 Unspecified dementia without behavioral disturbance: Secondary | ICD-10-CM

## 2015-09-01 DIAGNOSIS — W19XXXD Unspecified fall, subsequent encounter: Secondary | ICD-10-CM

## 2015-09-01 LAB — GLUCOSE, CAPILLARY
GLUCOSE-CAPILLARY: 111 mg/dL — AB (ref 65–99)
GLUCOSE-CAPILLARY: 96 mg/dL (ref 65–99)
Glucose-Capillary: 92 mg/dL (ref 65–99)
Glucose-Capillary: 93 mg/dL (ref 65–99)

## 2015-09-01 LAB — VITAMIN B12: Vitamin B-12: 556 pg/mL (ref 180–914)

## 2015-09-01 LAB — STREP PNEUMONIAE URINARY ANTIGEN: STREP PNEUMO URINARY ANTIGEN: NEGATIVE

## 2015-09-01 MED ORDER — ACETAMINOPHEN 650 MG RE SUPP
650.0000 mg | Freq: Four times a day (QID) | RECTAL | Status: DC | PRN
  Administered 2015-09-01: 650 mg via RECTAL
  Filled 2015-09-01: qty 1

## 2015-09-01 MED ORDER — GLYCOPYRROLATE 0.2 MG/ML IJ SOLN
0.4000 mg | INTRAMUSCULAR | Status: DC | PRN
  Administered 2015-09-02: 0.4 mg via INTRAVENOUS
  Filled 2015-09-01 (×3): qty 2

## 2015-09-01 MED ORDER — SODIUM CHLORIDE 0.9 % IV SOLN: INTRAVENOUS | Status: DC

## 2015-09-01 MED ORDER — SODIUM CHLORIDE 0.9 % IV SOLN
INTRAVENOUS | Status: AC
  Administered 2015-09-01 – 2015-09-03 (×2): via INTRAVENOUS

## 2015-09-01 MED ORDER — GUAIFENESIN ER 600 MG PO TB12
1200.0000 mg | ORAL_TABLET | Freq: Two times a day (BID) | ORAL | Status: DC
  Administered 2015-09-01 – 2015-09-05 (×5): 1200 mg via ORAL
  Filled 2015-09-01 (×8): qty 2

## 2015-09-01 NOTE — Progress Notes (Signed)
PROGRESS NOTE    Michael [name]  WUJ:[id number] DOB: 19-Nov-1927 DOA: 08/29/2015 PCP: Lupe Carney, MD Outpatient Specialists:  Neurology: Dr. Frances Furbish    Brief Narrative: 82 year old male with history of? Advanced Parkinson's disease with dementia brought by EMS after sustaining a fall at home. As per wife and son patient has had gait instability and several falls over the past few weeks .patient had another fall on the day of admission, EMS was called and en route was found to be lethargic. Patient noted to spike fever of 102 on the evening of 08/31/2015 with chest x-ray concerning for pneumonia and repeat urinalysis worrisome for UTI. Patient on empiric IV cefepime.   Assessment & Plan:   Principal Problem:   Metabolic encephalopathy Active Problems:   PNA (pneumonia)   UTI (urinary tract infection)   Parkinson's disease (HCC)   Altered mental state   Cough   Fall   Failure to thrive (0-17)   Hyperammonemia (HCC)   Dysphagia   Dementia  #1 acute metabolic encephalopathy Likely multifactorial secondary to pneumonia and UTI and possible hyperammonemia. Patient sleeping however easily arousable but drifts back to sleep. Per family some improvement from admission however not at baseline. Blood cultures pending. Check a sputum Gram stain and culture. Check a urine Legionella antigen. Check a urine pneumococcus antigen. Check a urine culture. Continue empiric IV cefepime. Follow.  #2 pneumonia Patient noted to have a fever last night chest x-ray worrisome for pneumonia. Patient had presented with a cough. Check a sputum Gram stain and culture. Check a urine Legionella antigen. Check a urine pneumococcus antigen. Continue empiric IV cefepime. Place on Mucinex. Speech therapy following. Patient for modified barium swallow today. Follow.  #3 UTI Check urine cultures. IV cefepime.  #4 falls Likely secondary to Parkinson's disease/gait abnormalities. Check a B-12 level. PT/OT. Needs  skilled nursing facility.  #5 dysphagia Patient for modified barium swallow today. Continue current dysphagia 3 diet. Speech therapy following. Patient with dysphagia/drooling concern for sialorrhea from Parkinson's may be contribution. Robinul when necessary.   #6 dementia Likely secondary to Parkinson's disease. Continue Aricept.  #7 Parkinson's disease Continue Sinemet. Follow-up with neurology in the outpatient setting.  #8 history of B-12 deficiency Check B-12 levels.   DVT prophylaxis: Lovenox  Code Status: Full Family Communication: Updated wife and daughter at bedside. Disposition Plan: To skilled nursing facility when afebrile and on oral antibiotics and culture results have resulted.   Consultants:   None  Procedures:   CT head CT C-spine 08/29/2015  Chest x-ray 08/29/2015, 08/31/2015  Plain films of the pelvis 08/29/2015   Antimicrobials:   IV cefepime 08/31/2015   Subjective: Patient sleeping however easily arousable. Patient following commands. Patient drifts back to sleep.  Objective: Filed Vitals:   08/31/15 2337 09/01/15 0405 09/01/15 0620 09/01/15 1000  BP: 118/57 131/94    Pulse: 69 82    Temp: 100.1 F (37.8 C) 101.6 F (38.7 C) 100.3 F (37.9 C) 99.4 F (37.4 C)  TempSrc: Rectal Rectal Rectal Oral  Resp: 20 20    Height:      Weight:      SpO2: 100% 100%      Intake/Output Summary (Last 24 hours) at 09/01/15 1100 Last data filed at 09/01/15 0400  Gross per 24 hour  Intake    645 ml  Output    350 ml  Net    295 ml   Filed Weights   08/29/15 1633 08/29/15 2136  Weight: 72.576 kg (160  lb) 72.7 kg (160 lb 4.4 oz)    Examination:  General exam: Appears calm and comfortable. Sleeping, arousable. Respiratory system: Decreased breath sounds in the left base with some coarse breath sounds.  Cardiovascular system: S1 & S2 heard, RRR. No JVD, murmurs, rubs, gallops or clicks. No pedal edema. Gastrointestinal system: Abdomen is  nondistended, soft and nontender. No organomegaly or masses felt. Normal bowel sounds heard. Central nervous system:  sleeping however easily arousable. No focal neurological deficits. Extremities: no clubbing cyanosis or edema.  Skin: No rashes, lesions or ulcers Psychiatry:  Mood & affect appropriate.     Data Reviewed: I have personally reviewed following labs and imaging studies  CBC:  Recent Labs Lab 08/29/15 1635 08/30/15 0450 08/31/15 2013  WBC 11.1* 8.5 16.0*  NEUTROABS  --  5.5 14.2*  HGB 13.1 11.6* 11.4*  HCT 39.1 35.3* 34.2*  MCV 89.5 91.2 91.0  PLT 272 251 215   Basic Metabolic Panel:  Recent Labs Lab 08/29/15 1635 08/30/15 0450 08/31/15 2013  NA 142 141 136  K 4.6 4.0 4.1  CL 107 108 104  CO2 GLUCOSE 110* 114* 126*  BUN 23* 20 30*  CREATININE 1.14 1.09 1.39*  CALCIUM 9.9 9.3 9.3   GFR: Estimated Creatinine Clearance: 33.3 mL/min (by C-G formula based on Cr of 1.39). Liver Function Tests:  Recent Labs Lab 08/30/15 0450 08/31/15 2013  AST 20 33  ALT 14* 7*  ALKPHOS 54 61  BILITOT 0.9 0.6  PROT 6.0* 6.1*  ALBUMIN 3.3* 3.2*   No results for input(s): LIPASE, AMYLASE in the last 168 hours.  Recent Labs Lab 08/29/15 2041 08/31/15 0459  AMMONIA 137* 26   Coagulation Profile: No results for input(s): INR, PROTIME in the last 168 hours. Cardiac Enzymes: No results for input(s): CKTOTAL, CKMB, CKMBINDEX, TROPONINI in the last 168 hours. BNP (last 3 results) No results for input(s): PROBNP in the last 8760 hours. HbA1C: No results for input(s): HGBA1C in the last 72 hours. CBG:  Recent Labs Lab 08/31/15 0732 08/31/15 1155 08/31/15 1813 08/31/15 2337 09/01/15 0616  GLUCAP 156* 134* 126* 116* 96   Lipid Profile: No results for input(s): CHOL, HDL, LDLCALC, TRIG, CHOLHDL, LDLDIRECT in the last 72 hours. Thyroid Function Tests:  Recent Labs  08/30/15 0450  TSH 2.336   Anemia Panel: No results for input(s): VITAMINB12,  FOLATE, FERRITIN, TIBC, IRON, RETICCTPCT in the last 72 hours. Urine analysis:    Component Value Date/Time   COLORURINE ORANGE* 08/31/2015 2151   APPEARANCEUR TURBID* 08/31/2015 2151   LABSPEC 1.027 08/31/2015 2151   PHURINE 5.0 08/31/2015 2151   GLUCOSEU NEGATIVE 08/31/2015 2151   HGBUR LARGE* 08/31/2015 2151   BILIRUBINUR SMALL* 08/31/2015 2151   KETONESUR NEGATIVE 08/31/2015 2151   PROTEINUR 100* 08/31/2015 2151   UROBILINOGEN 1.0 07/08/2009 0320   NITRITE POSITIVE* 08/31/2015 2151   LEUKOCYTESUR LARGE* 08/31/2015 2151   Sepsis Labs: (procalcitonin:4,lacticidven:4)  ) Recent Results (from the past 240 hour(s))  Urine culture     Status: None   Collection Time: 08/29/15  8:05 PM  Result Value Ref Range Status   Specimen Description URINE, CLEAN CATCH  Final   Special Requests NONE  Final   Culture   Final    NO GROWTH 1 DAY Performed at [facility]    Report Status 08/31/2015 FINAL  Final         Radiology Studies: Dg Chest Port 1 View  08/31/2015  CLINICAL DATA:  Fever. EXAM: PORTABLE CHEST 1 VIEW COMPARISON:  August 29, 2015. FINDINGS: Stable cardiomediastinal silhouette. No pneumothorax is noted. Right lung is clear. Increased left basilar opacity is noted consistent with worsening atelectasis or possibly pneumonia. Minimal left pleural effusion is noted. Old right rib fractures are noted. Atherosclerosis thoracic aorta is noted. IMPRESSION: Increased left basilar opacity is noted concerning for worsening atelectasis or pneumonia. Possible minimal left pleural effusion is noted as well. Electronically Signed   By: Lupita RaiderJames  Green Jr, M.D.   On: 08/31/2015 20:13        Scheduled Meds: . aspirin  325 mg Oral Daily  . carbidopa-levodopa  1.5 tablet Oral TID  . ceFEPime (MAXIPIME) IV  2 g Intravenous Q24H  . donepezil  10 mg Oral QHS  . enoxaparin (LOVENOX) injection  40 mg Subcutaneous QHS  . oxybutynin  5 mg Oral QHS   Continuous Infusions: .  sodium chloride 75 mL/hr at 09/01/15 0859     LOS: 3 days    Time spent: 35 mins    THOMPSON,DANIEL, MD [facility] Pager [phone]  If 7PM-7AM, please contact night-coverage [email or web address] Password [email or web address] 09/01/2015, 11:00 AM

## 2015-09-01 NOTE — Progress Notes (Signed)
PT demonstrated minimal hands on understanding of Flutter device. With encouragement and coaching from RT and other caregivers- this therapy can be accomplished.

## 2015-09-01 NOTE — Progress Notes (Signed)
MBS completed, pt presents with moderate oropharyngeal dysphagia characterized by sensorimotor deficits due to his Parkinson's disease.  Weakness and sensory deficits result in delayed oral transiting, lingual pumping and delayed pharyngeal swallow response.  Mild oropharyngeal residuals present without pt awareness.  Cued dry swallows were difficult for him to conduct but were effective when achieved.  Pt did tracely SILENTLY aspirate thin mixed with secretions due to delayed pharyngeal swallow.  Aspirates cleared with cued cough.  Pt did have retained secretions in mouth and wet voice prior to starting MBS - suspect aspiration of secretions ongoing.    Of note, pt also appeared with decreased CP compliance = allowing minimal backflow at proximal esophagus without pt awareness. This could also contribute to pt's aspiration risk.    Recommendations Continue Dys3/nectar diet Medicine with applesauce (crush if large and not contraindicated) Liquids via cup Allow single ice chips Follow precautions, start with liquids, follow solids with liquids, intermittent dry swallow Stay up 30-60 minutes after meals Cough/"hock" and expectorate when voice gurgly/wet to clear secretions. Rest if short of breath or excessively coughing   Follow up SLP at home for dysphagia management and treatment.    Michael [name] Michael Vanduyne, MS [facility] SLP [phone]

## 2015-09-01 NOTE — Progress Notes (Signed)
RN notified the PCP on call that the temperature was 101.39F Rectally. Awaiting any new orders.

## 2015-09-01 NOTE — Progress Notes (Signed)
Temperature came down to 100.3 F rectally

## 2015-09-01 NOTE — Progress Notes (Signed)
Granddaughter Marybelle KillingsLanesa called to check on her grandfather, spoke with the RN

## 2015-09-01 NOTE — Care Management Important Message (Signed)
Important Message  Patient Details  Name: Fabiola BackerBobby R Mutch MRN: [id number] Date of Birth: 04-07-1928   Medicare Important Message Given:  Yes    Haskell FlirtJamison, Eathan Groman 09/01/2015, 9:08 AMImportant Message  Patient Details  Name: Fabiola BackerBobby R Encalade MRN: [id number] Date of Birth: 04-07-1928   Medicare Important Message Given:  Yes    Haskell FlirtJamison, Danae Oland 09/01/2015, 9:07 AM

## 2015-09-02 LAB — CBC
HCT: 30.6 % — ABNORMAL LOW (ref 39.0–52.0)
HEMOGLOBIN: 10.1 g/dL — AB (ref 13.0–17.0)
MCH: 29.7 pg (ref 26.0–34.0)
MCHC: 33 g/dL (ref 30.0–36.0)
MCV: 90 fL (ref 78.0–100.0)
PLATELETS: 171 10*3/uL (ref 150–400)
RBC: 3.4 MIL/uL — ABNORMAL LOW (ref 4.22–5.81)
RDW: 13.2 % (ref 11.5–15.5)
WBC: 10.5 10*3/uL (ref 4.0–10.5)

## 2015-09-02 LAB — GLUCOSE, CAPILLARY
GLUCOSE-CAPILLARY: 88 mg/dL (ref 65–99)
GLUCOSE-CAPILLARY: 117 mg/dL — AB (ref 65–99)
Glucose-Capillary: 99 mg/dL (ref 65–99)

## 2015-09-02 LAB — HIV ANTIBODY (ROUTINE TESTING W REFLEX): HIV Screen 4th Generation wRfx: NONREACTIVE

## 2015-09-02 LAB — BASIC METABOLIC PANEL
ANION GAP: 7 (ref 5–15)
BUN: 24 mg/dL — ABNORMAL HIGH (ref 6–20)
CALCIUM: 9.1 mg/dL (ref 8.9–10.3)
CO2: 24 mmol/L (ref 22–32)
CREATININE: 1.05 mg/dL (ref 0.61–1.24)
Chloride: 112 mmol/L — ABNORMAL HIGH (ref 101–111)
GFR calc Af Amer: >60 mL/min (ref >60)
GLUCOSE: 100 mg/dL — AB (ref 65–99)
Potassium: 4.3 mmol/L (ref 3.5–5.1)
Sodium: 143 mmol/L (ref 135–145)

## 2015-09-02 LAB — LEGIONELLA PNEUMOPHILA SEROGP 1 UR AG: L. pneumophila Serogp 1 Ur Ag: NEGATIVE

## 2015-09-02 LAB — MAGNESIUM: MAGNESIUM: 1.9 mg/dL (ref 1.7–2.4)

## 2015-09-02 NOTE — Progress Notes (Signed)
Speech Language Pathology Treatment: Dysphagia  Patient Details Name: Michael BackerBobby R [name] MRN: [id number] DOB: 1927-11-24 Today's Date: 09/02/2015 Time: [id number] SLP Time Calculation (min) (ACUTE ONLY): 28 min  Assessment / Plan / Recommendation Clinical Impression  Pt overtly coughing/choking during breakfast as soon as SLP entered breakfast.  He was turning red with poor airway protection.  SLP helped sit pt upright in chair, encourged him to cough and used Yankeur's suction removing viscous phlegm and potato bite (approximately 2x2 inches square).   Pt was sitting with eyes closed while eating and appeared sleepy/worn out after aspiration episode- wife then stated "He sleeps a lot at home".    Alerted RN to event and spoke to wife re: chronicity of aspiration with Parkinson's and impact on nutrition/possible pulmonary ramifications.  Spouse states she understands and she is agreeable/desires pt to have po for comfort *knowing he is aspirating* and have palliative referral.  Reiterated need to not feel pt when sleepy and to STOP Intake if pt coughing.    RN checked pt's oxygen saturation and it was 96.  Gurgly breathing ongoing and pt too weak/lethargic to clear.  Notified MD of occurrence and wife's wishes via text and he returned call - thank you.       HPI HPI: pt is an 82 yo male with h/p Parkinson's disease admitted to hospital with AMS, acute encephalopathy, lethargy.  RN reports pt's ammonia levels are off.  Pt has had multiple recent falls- has rib fractures.  CXR 4/16 favors ATX,  but pt spiked temps overnight and CXR worsening.  CT head negative for acute change.  MBS indicated.       SLP Plan  Continue with current plan of care     Recommendations  Diet recommendations: Nectar-thick liquid (with known risk for comfort/pleasure) Liquids provided via: Teaspoon;Cup Medication Administration: Whole meds with puree Supervision: Full supervision/cueing for compensatory  strategies Compensations: Minimize environmental distractions;Slow rate;Small sips/bites (cue pt to clear throat/cough if voice gurgly, assure pt swallows - delayed swallow response) Postural Changes and/or Swallow Maneuvers: Seated upright 90 degrees;Upright 30-60 min after meal             Oral Care Recommendations: Oral care QID Follow up Recommendations: Home health SLP Plan: Continue with current plan of care     GO               Donavan Burnetamara Manju Kulkarni, MS [facility] SLP [phone]

## 2015-09-02 NOTE — Progress Notes (Signed)
PROGRESS NOTE    Michael [name]  ZOX:[id number] DOB: 04-Jul-1927 DOA: 08/29/2015 PCP: Lupe Carney, MD Outpatient Specialists:  Neurology: Dr. Frances Furbish    Brief Narrative: 82 year old male with history of? Advanced Parkinson's disease with dementia brought by EMS after sustaining a fall at home. As per wife and son patient has had gait instability and several falls over the past few weeks .patient had another fall on the day of admission, EMS was called and en route was found to be lethargic. Patient noted to spike fever of 102 on the evening of 08/31/2015 with chest x-ray concerning for pneumonia and repeat urinalysis worrisome for UTI. Patient on empiric IV cefepime.   Assessment & Plan:   Principal Problem:   Metabolic encephalopathy Active Problems:   PNA (pneumonia)   UTI (urinary tract infection)   Parkinson's disease (HCC)   Altered mental state   Cough   Fall   Failure to thrive (0-17)   Hyperammonemia (HCC)   Dysphagia   Dementia  #1 acute metabolic encephalopathy Likely multifactorial secondary to pneumonia and UTI and possible hyperammonemia. Patient sitting up in chair today and more alert. Per family some improvement from admission however not at baseline. Blood cultures pending. Sputum Gram stain and culture pending. Urine Legionella antigen pending. Urine pneumococcus antigen is negative. Urine cultures are pending. Continue empiric IV cefepime. Follow.  #2 pneumonia Patient noted to have a fever 08/31/2015, with chest x-ray worrisome for pneumonia. Patient had presented with a cough. Patient noted to aspirate per speech therapy and patient had a aspiration episode this morning and a such pneumonia likely secondary to aspiration pneumonia. Sputum Gram stain and culture pending. Urine strep pneumococcus antigen negative. Urine Legionella antigen pending. Continue empiric IV cefepime, Mucinex. Speech therapy following. Follow.  #3 UTI Urine cultures pending. Continue  IV cefepime.   #4 falls Likely secondary to Parkinson's disease/gait abnormalities. B-12 level is 556. PT/OT. Needs skilled nursing facility.  #5 dysphagia Patient s/p modified barium swallow yesterday which showed moderate oral pharyngeal dysphagia secondary to sensorimotor deficits due to Parkinson's disease. Patient did silently aspirate then mixed with secretions due to delayed pharyngeal swallow. Patient was placed on a dysphagia 3 diet with nectar thick liquids and aspiration precautions. Patient noted to have a aspiration episode today and required suctioning per SLP. Patient's diet has been changed to a full liquid diet with nectar thick liquids for now. Speech therapy following. Patient with dysphagia/drooling concern for sialorrhea from Parkinson's may be contribution. Robinul when necessary.   #6 dementia Likely secondary to Parkinson's disease. Continue Aricept.  #7 Parkinson's disease Patient sleeping most of the day per family. Patient with increased falls with gait abnormality. Patient also with dysphagia with increased aspiration risk. Concern for progressive Parkinson's disease. Continue Sinemet. Follow-up with neurology in the outpatient setting. Will consult with palliative care for goals of care.  #8 history of B-12 deficiency Check B-12 levels.   DVT prophylaxis: Lovenox  Code Status: Full Family Communication: Updated wife and daughter at bedside. Disposition Plan: To skilled nursing facility when afebrile and on oral antibiotics and culture results have resulted.   Consultants:   Palliative Care pending.  Procedures:   CT head CT C-spine 08/29/2015  Chest x-ray 08/29/2015, 08/31/2015  Plain films of the pelvis 08/29/2015   Modified barium swallow 09/01/2015  Antimicrobials:   IV cefepime 08/31/2015   Subjective: Patient sitting up in chair. Patient with an episode of aspiration this morning per SLP. Patient following commands. Patient more alert.  Objective: Filed Vitals:   09/01/15 1000 09/01/15 1411 09/01/15 2032 09/02/15 0500  BP:  114/47 128/98 147/55  Pulse:  59 63 59  Temp: 99.4 F (37.4 C) 98.2 F (36.8 C) 99.4 F (37.4 C) 99.1 F (37.3 C)  TempSrc: Oral Oral Oral Oral  Resp:  16 16 18   Height:      Weight:    74.6 kg (164 lb 7.4 oz)  SpO2:  100% 98% 96%    Intake/Output Summary (Last 24 hours) at 09/02/15 1100 Last data filed at 09/02/15 0900  Gross per 24 hour  Intake  842.5 ml  Output    225 ml  Net  617.5 ml   Filed Weights   08/29/15 1633 08/29/15 2136 09/02/15 0500  Weight: 72.576 kg (160 lb) 72.7 kg (160 lb 4.4 oz) 74.6 kg (164 lb 7.4 oz)    Examination:  General exam: Appears calm and comfortable. Sitting up in chair. Grunting. Respiratory system: Decreased breath sounds in the left base with some coarse breath sounds.  Cardiovascular system: S1 & S2 heard, RRR. No JVD, murmurs, rubs, gallops or clicks. No pedal edema. Gastrointestinal system: Abdomen is nondistended, soft and nontender. No organomegaly or masses felt. Normal bowel sounds heard. Central nervous system:  No focal neurological deficits. Extremities: no clubbing cyanosis or edema.  Skin: No rashes, lesions or ulcers Psychiatry:  Mood & affect appropriate.     Data Reviewed: I have personally reviewed following labs and imaging studies  CBC:  Recent Labs Lab 08/29/15 1635 08/30/15 0450 08/31/15 2013 09/02/15 0455  WBC 11.1* 8.5 16.0* 10.5  NEUTROABS  --  5.5 14.2*  --   HGB 13.1 11.6* 11.4* 10.1*  HCT 39.1 35.3* 34.2* 30.6*  MCV 89.5 91.2 91.0 90.0  PLT 272 251 215 171   Basic Metabolic Panel:  Recent Labs Lab 08/29/15 1635 08/30/15 0450 08/31/15 2013 09/02/15 0455  NA 142 141 136 143  K 4.6 4.0 4.1 4.3  CL 107 108 104 112*  CO2 26 25 23 24   GLUCOSE 110* 114* 126* 100*  BUN 23* 20 30* 24*  CREATININE 1.14 1.09 1.39* 1.05  CALCIUM 9.9 9.3 9.3 9.1  MG  --   --   --  1.9   GFR: Estimated Creatinine  Clearance: 44.7 mL/min (by C-G formula based on Cr of 1.05). Liver Function Tests:  Recent Labs Lab 08/30/15 0450 08/31/15 2013  AST 20 33  ALT 14* 7*  ALKPHOS 54 61  BILITOT 0.9 0.6  PROT 6.0* 6.1*  ALBUMIN 3.3* 3.2*   No results for input(s): LIPASE, AMYLASE in the last 168 hours.  Recent Labs Lab 08/29/15 2041 08/31/15 0459  AMMONIA 137* 26   Coagulation Profile: No results for input(s): INR, PROTIME in the last 168 hours. Cardiac Enzymes: No results for input(s): CKTOTAL, CKMB, CKMBINDEX, TROPONINI in the last 168 hours. BNP (last 3 results) No results for input(s): PROBNP in the last 8760 hours. HbA1C: No results for input(s): HGBA1C in the last 72 hours. CBG:  Recent Labs Lab 09/01/15 0616 09/01/15 1204 09/01/15 1709 09/01/15 2348 09/02/15 0723  GLUCAP 96 92 93 111* 88   Lipid Profile: No results for input(s): CHOL, HDL, LDLCALC, TRIG, CHOLHDL, LDLDIRECT in the last 72 hours. Thyroid Function Tests: No results for input(s): TSH, T4TOTAL, FREET4, T3FREE, THYROIDAB in the last 72 hours. Anemia Panel:  Recent Labs  09/01/15 1126  VITAMINB12 556   Urine analysis:    Component Value Date/Time   COLORURINE ORANGE*  08/31/2015 2151   APPEARANCEUR TURBID* 08/31/2015 2151   LABSPEC 1.027 08/31/2015 2151   PHURINE 5.0 08/31/2015 2151   GLUCOSEU NEGATIVE 08/31/2015 2151   HGBUR LARGE* 08/31/2015 2151   BILIRUBINUR SMALL* 08/31/2015 2151   KETONESUR NEGATIVE 08/31/2015 2151   PROTEINUR 100* 08/31/2015 2151   UROBILINOGEN 1.0 07/08/2009 0320   NITRITE POSITIVE* 08/31/2015 2151   LEUKOCYTESUR LARGE* 08/31/2015 2151   Sepsis Labs: (procalcitonin:4,lacticidven:4)  ) Recent Results (from the past 240 hour(s))  Urine culture     Status: None   Collection Time: 08/29/15  8:05 PM  Result Value Ref Range Status   Specimen Description URINE, CLEAN CATCH  Final   Special Requests NONE  Final   Culture   Final    NO GROWTH 1 DAY Performed at  [facility]    Report Status 08/31/2015 FINAL  Final  Culture, blood (Routine X 2) w Reflex to ID Panel     Status: None (Preliminary result)   Collection Time: 08/31/15  8:14 PM  Result Value Ref Range Status   Specimen Description BLOOD RIGHT HAND  Final   Special Requests IN PEDIATRIC BOTTLE 1CC  Final   Culture   Final    NO GROWTH < 24 HOURS Performed at [facility]    Report Status PENDING  Incomplete  Culture, blood (Routine X 2) w Reflex to ID Panel     Status: None (Preliminary result)   Collection Time: 08/31/15  8:14 PM  Result Value Ref Range Status   Specimen Description BLOOD LEFT HAND  Final   Special Requests BOTTLES DRAWN AEROBIC AND ANAEROBIC 1CC  Final   Culture  Setup Time   Final    GRAM POSITIVE COCCI IN CLUSTERS ANAEROBIC BOTTLE ONLY CRITICAL RESULT CALLED TO, READ BACK BY AND VERIFIED WITH: LISA [facility]  09/02/15 MKELLY    Culture   Final    NO GROWTH < 24 HOURS Performed at [facility]    Report Status PENDING  Incomplete         Radiology Studies: Dg Chest Port 1 View  08/31/2015  CLINICAL DATA:  Fever. EXAM: PORTABLE CHEST 1 VIEW COMPARISON:  August 29, 2015. FINDINGS: Stable cardiomediastinal silhouette. No pneumothorax is noted. Right lung is clear. Increased left basilar opacity is noted consistent with worsening atelectasis or possibly pneumonia. Minimal left pleural effusion is noted. Old right rib fractures are noted. Atherosclerosis thoracic aorta is noted. IMPRESSION: Increased left basilar opacity is noted concerning for worsening atelectasis or pneumonia. Possible minimal left pleural effusion is noted as well. Electronically Signed   By: Lupita Raider, M.D.   On: 08/31/2015 20:13   Dg Swallowing Func-speech Pathology  09/01/2015  Objective Swallowing Evaluation: Type of Study: MBS-Modified Barium Swallow Study Patient Details Name: JAMARKUS LISBON MRN: [id number] Date of Birth: 1927-07-25 Today's Date:  09/01/2015 Time: SLP Start Time (ACUTE ONLY): 1310-SLP Stop Time (ACUTE ONLY): 1340 SLP Time Calculation (min) (ACUTE ONLY): 30 min Past Medical History: Past Medical History Diagnosis Date . 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 Unspecified transient cerebral ischemia  . Hallucinations  . Other vitamin B12 deficiency anemia  . Paralysis agitans (HCC)  . Parkinson's disease (HCC) 03/27/2013 Past Surgical History: Past Surgical History Procedure Laterality Date . Cholecystectomy   . Transurethral resection of prostate   . Appendectomy   . Ptca  1987 HPI: pt is an 82 yo male with h/p Parkinson's disease admitted to hospital with AMS,  acute encephalopathy, lethargy.  RN reports pt's ammonia levels are off.  Pt has had multiple recent falls- has rib fractures.  CXR 4/16 favors ATX,  but pt spiked temps overnight and CXR worsening.  CT head negative for acute change.  MBS indicated.  Subjective: pt awake in chair Assessment / Plan / Recommendation CHL IP CLINICAL IMPRESSIONS 09/01/2015 Therapy Diagnosis Moderate oral phase dysphagia;Moderate pharyngeal phase dysphagia;Mild cervical esophageal phase dysphagia Clinical Impression Pt presents with moderate oropharyngeal dysphagia characterized by sensorimotor deficits due to his Parkinson's disease. Weakness and sensory deficits result in delayed oral transiting, lingual pumping and delayed pharyngeal swallow response. Mild oropharyngeal residuals present without pt awareness. Cued dry swallows were difficult for him to conduct but were effective when achieved. Pt did tracely SILENTLY aspirate thin mixed with secretions due to delayed pharyngeal swallow. Aspirates cleared with cued cough. Pt did have retained secretions in mouth and wet voice prior to starting MBS - suspect aspiration of secretions ongoing.Of note, pt also appeared with decreased CP compliance = allowing minimal backflow at proximal esophagus without pt awareness.  This could also contribute to pt's aspiration risk. Recommendations Continue Dys3/nectar diet. Medicine with applesauce (crush if large and not contraindicated).Liquids via cup , follow solids with liquids, intermittent dry swallow, oral care after meals, cough if voice gurgly Follow up SLP at next venue of care for dysphagia management.  Impact on safety and function Moderate aspiration risk;Severe aspiration risk   CHL IP TREATMENT RECOMMENDATION 09/01/2015 Treatment Recommendations Therapy as outlined in treatment plan below   Prognosis 09/01/2015 Prognosis for Safe Diet Advancement Fair Barriers to Reach Goals Time post onset Barriers/Prognosis Comment -- CHL IP DIET RECOMMENDATION 09/01/2015 SLP Diet Recommendations Ice chips PRN after oral care;Dysphagia 3 (Mech soft) solids;Nectar thick liquid Liquid Administration via -- Medication Administration Crushed with puree Compensations Minimize environmental distractions;Slow rate;Small sips/bites;Follow solids with liquid;Hard cough after swallow Postural Changes --   No flowsheet data found.  CHL IP FOLLOW UP RECOMMENDATIONS 09/01/2015 Follow up Recommendations Home health SLP   CHL IP FREQUENCY AND DURATION 09/01/2015 Speech Therapy Frequency (ACUTE ONLY) min 2x/week Treatment Duration 2 weeks      CHL IP ORAL PHASE 09/01/2015 Oral Phase Impaired Oral - Pudding Teaspoon -- Oral - Pudding Cup -- Oral - Honey Teaspoon -- Oral - Honey Cup -- Oral - Nectar Teaspoon Weak lingual manipulation;Lingual pumping;Piecemeal swallowing;Delayed oral transit Oral - Nectar Cup Weak lingual manipulation;Lingual pumping;Piecemeal swallowing;Delayed oral transit;Premature spillage Oral - Nectar Straw -- Oral - Thin Teaspoon Weak lingual manipulation;Lingual pumping;Reduced posterior propulsion;Piecemeal swallowing;Delayed oral transit;Decreased bolus cohesion Oral - Thin Cup Weak lingual manipulation;Reduced posterior propulsion;Delayed oral transit;Premature spillage Oral - Thin  Straw Weak lingual manipulation;Lingual pumping;Reduced posterior propulsion;Lingual/palatal residue;Delayed oral transit Oral - Puree Weak lingual manipulation;Lingual pumping;Lingual/palatal residue;Piecemeal swallowing;Delayed oral transit Oral - Mech Soft -- Oral - Regular Impaired mastication;Weak lingual manipulation;Lingual pumping;Lingual/palatal residue;Piecemeal swallowing;Delayed oral transit Oral - Multi-Consistency -- Oral - Pill -- Oral Phase - Comment pt without awareness to mild oral residuals  CHL IP PHARYNGEAL PHASE 09/01/2015 Pharyngeal Phase Impaired Pharyngeal- Pudding Teaspoon -- Pharyngeal -- Pharyngeal- Pudding Cup -- Pharyngeal -- Pharyngeal- Honey Teaspoon -- Pharyngeal -- Pharyngeal- Honey Cup -- Pharyngeal -- Pharyngeal- Nectar Teaspoon Delayed swallow initiation-pyriform sinuses Pharyngeal -- Pharyngeal- Nectar Cup Delayed swallow initiation-pyriform sinuses Pharyngeal -- Pharyngeal- Nectar Straw -- Pharyngeal -- Pharyngeal- Thin Teaspoon Delayed swallow initiation-pyriform sinuses Pharyngeal -- Pharyngeal- Thin Cup Delayed swallow initiation-pyriform sinuses Pharyngeal -- Pharyngeal- Thin Straw Delayed swallow initiation-pyriform sinuses Pharyngeal -- Pharyngeal- Puree  Delayed swallow initiation-vallecula Pharyngeal -- Pharyngeal- Mechanical Soft -- Pharyngeal -- Pharyngeal- Regular Delayed swallow initiation-vallecula Pharyngeal -- Pharyngeal- Multi-consistency -- Pharyngeal -- Pharyngeal- Pill -- Pharyngeal -- Pharyngeal Comment --  CHL IP CERVICAL ESOPHAGEAL PHASE 09/01/2015 Cervical Esophageal Phase Impaired Pudding Teaspoon -- Pudding Cup -- Honey Teaspoon -- Honey Cup -- Nectar Teaspoon Reduced cricopharyngeal relaxation;Esophageal backflow into cervical esophagus Nectar Cup Reduced cricopharyngeal relaxation;Esophageal backflow into cervical esophagus Nectar Straw -- Thin Teaspoon Reduced cricopharyngeal relaxation;Esophageal backflow into cervical esophagus Thin Cup Reduced  cricopharyngeal relaxation;Esophageal backflow into cervical esophagus Thin Straw Reduced cricopharyngeal relaxation;Esophageal backflow into cervical esophagus Puree -- Mechanical Soft -- Regular -- Multi-consistency -- Pill -- Cervical Esophageal Comment pt without senstion to appearance of mild backflow Donavan Burnetamara Kimball, MS [facility] SLP [phone]                   Scheduled Meds: . aspirin  325 mg Oral Daily  . carbidopa-levodopa  1.5 tablet Oral TID  . ceFEPime (MAXIPIME) IV  2 g Intravenous Q24H  . donepezil  10 mg Oral QHS  . enoxaparin (LOVENOX) injection  40 mg Subcutaneous QHS  . guaiFENesin  1,200 mg Oral BID  . oxybutynin  5 mg Oral QHS   Continuous Infusions: . sodium chloride 50 mL/hr at 09/02/15 0747     LOS: 4 days    Time spent: 35 mins    THOMPSON,DANIEL, MD [facility] Pager [phone]  If 7PM-7AM, please contact night-coverage [email or web address] Password [email or web address] 09/02/2015, 11:00 AM

## 2015-09-02 NOTE — Progress Notes (Signed)
Physical Therapy Treatment Patient Details Name: Michael BackerBobby R [name] MRN: [id number] DOB: 04/28/1928 Today's Date: 09/02/2015    History of Present Illness 82 yo male with history of PD, early dementia admitted after fall at home and presents with acute encephalopathy    PT Comments    Pt assisted with ambulating short distance however fatigues quickly.  Pt requiring more assist with increased fatigue.  Continue to recommend SNF upon d/c.  Follow Up Recommendations  SNF;Supervision/Assistance - 24 hour     Equipment Recommendations  None recommended by PT    Recommendations for Other Services       Precautions / Restrictions Precautions Precautions: Fall    Mobility  Bed Mobility Overal bed mobility: Needs Assistance Bed Mobility: Supine to Sit     Supine to sit: Max assist;+2 for physical assistance;HOB elevated     General bed mobility comments: assist for LEs over EOB, trunk upright and scooting hips to EOB utilizing bed pad  Transfers Overall transfer level: Needs assistance Equipment used: Rolling walker (2 wheeled)   Sit to Stand: +2 safety/equipment;Min assist Stand pivot transfers: Min assist;+2 safety/equipment       General transfer comment: verbal cues for safe technique, assist to rise and steady, manual cues for hand placement to assist controlling descent  Ambulation/Gait Ambulation/Gait assistance: Mod assist Ambulation Distance (Feet): 30 Feet Assistive device: Rolling walker (2 wheeled) Gait Pattern/deviations: Step-through pattern;Decreased stride length;Shuffle;Trunk flexed     General Gait Details: increased cues for posture and RW positioning, pt tends to keep RW too far forward, assist increased as pt fatigued, maintained 3L O2 [place]   Stairs            Wheelchair Mobility    Modified Rankin (Stroke Patients Only)       Balance                                    Cognition Arousal/Alertness: Awake/alert Behavior  During Therapy: WFL for tasks assessed/performed Overall Cognitive Status: Impaired/Different from baseline Area of Impairment: Following commands       Following Commands: Follows one step commands consistently       General Comments: lethargic until sitting EOB    Exercises      General Comments        Pertinent Vitals/Pain Pain Assessment: No/denies pain    Home Living                      Prior Function            PT Goals (current goals can now be found in the care plan section) Progress towards PT goals: Progressing toward goals    Frequency  Min 3X/week    PT Plan Current plan remains appropriate    Co-evaluation             End of Session Equipment Utilized During Treatment: Gait belt;Oxygen Activity Tolerance: Patient limited by fatigue Patient left: in chair;with call bell/phone within reach;with chair alarm set;with nursing/sitter in room     Time: 0912-0924 PT Time Calculation (min) (ACUTE ONLY): 12 min  Charges:  $Gait Training: 8-22 mins                    G Codes:      Sandrine Bloodsworth,KATHrine E 09/02/2015, 12:43 PM Zenovia JarredKati Annora Guderian, PT, DPT 09/02/2015 Pager: [phone]

## 2015-09-02 NOTE — Care Management Note (Signed)
Case Management Note  Patient Details  Name: Fabiola BackerBobby R Demilio MRN: [id number] Date of Birth: 12/09/1927  Subjective/Objective: Full Code.Palliative cons. Current d/c plan SNF.CSW following.                   Action/Plan:   Expected Discharge Date:                  Expected Discharge Plan:  Skilled Nursing Facility  In-House Referral:     Discharge planning Services  CM Consult  Post Acute Care Choice:    Choice offered to:     DME Arranged:    DME Agency:     HH Arranged:    HH Agency:     Status of Service:  In process, will continue to follow  Medicare Important Message Given:  Yes Date Medicare IM Given:    Medicare IM give by:    Date Additional Medicare IM Given:    Additional Medicare Important Message give by:     If discussed at Long Length of Stay Meetings, dates discussed:    Additional Comments:  Lanier ClamMahabir, Lash Matulich, RN 09/02/2015, 2:42 PM

## 2015-09-03 DIAGNOSIS — B9620 Unspecified Escherichia coli [E. coli] as the cause of diseases classified elsewhere: Secondary | ICD-10-CM | POA: Insufficient documentation

## 2015-09-03 DIAGNOSIS — Z7189 Other specified counseling: Secondary | ICD-10-CM | POA: Insufficient documentation

## 2015-09-03 DIAGNOSIS — N390 Urinary tract infection, site not specified: Secondary | ICD-10-CM

## 2015-09-03 DIAGNOSIS — Z515 Encounter for palliative care: Secondary | ICD-10-CM | POA: Insufficient documentation

## 2015-09-03 LAB — CBC
HEMATOCRIT: 33.1 % — AB (ref 39.0–52.0)
HEMOGLOBIN: 10.8 g/dL — AB (ref 13.0–17.0)
MCH: 30.3 pg (ref 26.0–34.0)
MCHC: 32.6 g/dL (ref 30.0–36.0)
MCV: 92.7 fL (ref 78.0–100.0)
Platelets: 175 10*3/uL (ref 150–400)
RBC: 3.57 MIL/uL — AB (ref 4.22–5.81)
RDW: 13.3 % (ref 11.5–15.5)
WBC: 7.5 10*3/uL (ref 4.0–10.5)

## 2015-09-03 LAB — CULTURE, BLOOD (ROUTINE X 2)

## 2015-09-03 LAB — BASIC METABOLIC PANEL
ANION GAP: 9 (ref 5–15)
BUN: 18 mg/dL (ref 6–20)
CHLORIDE: 111 mmol/L (ref 101–111)
CO2: 24 mmol/L (ref 22–32)
Calcium: 10.1 mg/dL (ref 8.9–10.3)
Creatinine, Ser: 0.84 mg/dL (ref 0.61–1.24)
GFR calc Af Amer: >60 mL/min (ref >60)
GFR calc non Af Amer: >60 mL/min (ref >60)
GLUCOSE: 109 mg/dL — AB (ref 65–99)
POTASSIUM: 4.6 mmol/L (ref 3.5–5.1)
Sodium: 144 mmol/L (ref 135–145)

## 2015-09-03 LAB — GLUCOSE, CAPILLARY
GLUCOSE-CAPILLARY: 113 mg/dL — AB (ref 65–99)
GLUCOSE-CAPILLARY: 94 mg/dL (ref 65–99)

## 2015-09-03 LAB — AMMONIA: Ammonia: 26 umol/L (ref 9–35)

## 2015-09-03 MED ORDER — DEXTROSE 5 % IV SOLN
1.0000 g | Freq: Every day | INTRAVENOUS | Status: DC
  Administered 2015-09-03 – 2015-09-04 (×2): 1 g via INTRAVENOUS
  Filled 2015-09-03 (×3): qty 10

## 2015-09-03 MED ORDER — SODIUM CHLORIDE 0.9 % IV SOLN
INTRAVENOUS | Status: DC
  Administered 2015-09-03 – 2015-09-04 (×2): via INTRAVENOUS

## 2015-09-03 MED ORDER — CEFPODOXIME PROXETIL 200 MG PO TABS
200.0000 mg | ORAL_TABLET | Freq: Two times a day (BID) | ORAL | Status: DC
  Filled 2015-09-03: qty 1

## 2015-09-03 NOTE — Consult Note (Addendum)
Consultation Note Date: 09/03/2015   Patient Name: Michael [name]  DOB: [birth year]  MRN: [id number]  Age / Sex: 82 y.o., male  PCP: L.[name], MD Referring Physician: Eugenie Filler, MD  Reason for Consultation: Establishing goals of care    Clinical Assessment/Narrative:  Patient is a 82 year old woman with a past medical history significant for advanced Parkinson's disease, dementia. He lives at home with his wife. His son, daughter, granddaughter and great-granddaughter participate significantly in his care at home. He also has home health care, uses a walker, has home health aides at home. Patient sees Dr. [name] from neurology for his Parkinson's disease. The patient has a history of having gait instability and recurrent falls. The patient has been having several falls, arrived to the hospital after a fall and was found to be confused and lethargic. Patient was admitted with altered mental status and elevated ammonia. He was initially given lactulose. Speech therapy evaluation was done to assess for aspiration. Hospitalization course complicated by the patient's spiking a fever and treatment was begun for possible healthcare associated pneumonia. He is being given broad-spectrum antibiotic for both pneumonia and urinary tract infection.  Initially, case management consultation was obtained, plan is to proceed with skilled nursing facility placement for rehabilitation attempt. The patient already has a bed at [facility] facility. However, during the course of this hospitalization, the patient has more periods of sleepiness then awakeness/alertness. His oral intake is declining. Hence, palliative care consultation for goals of care discussions.  Met with the patient's family, wife Inez Catalina of 54 years, granddaughter, great-granddaughter in the room. Patient's son Shanon Brow, patient's daughter Jeannene Patella join via telephone.  Patient's medical conditions as mentioned above discussed in detail. Trajectory of decline from dementia, Parkinson's plus stress of acute infections, recent falls discussed in detail. Goals of care attempted to be elicited. Patient has a living will. Family states that the patient has elected DO NOT RESUSCITATE/DO NOT INTUBATE. They are hopeful for some degree of stabilization/recovery hence they would like for him to go to [place] place for rehabilitation. Recommend palliative care to follow over there. Everybody is in agreement. This has been discussed with social worker Claiborne Billings after the family meeting. Follow patient's disease trajectory year in the hospital until the time of discharge. Discussed frankly with the patient's family members regarding patient's overall condition showing lack of significant improvement, high likelihood of ongoing decline. Family is aware. All questions answered to the best of my ability.  Contacts/Participants in Discussion: Primary Decision Maker:     Relationship to Patient   HCPOA: yes     SUMMARY OF RECOMMENDATIONS: DO NOT RESUSCITATE/DO NOT INTUBATE Skilled nursing facility for rehabilitation with palliative services follow-up through hospice and palliative care center of [facility] Dysphagia 3 diet, aspiration precautions Initial discussions undertaken regarding artificial nutrition and hydration issues in the face of advancing Parkinson's disease, family overall not leaning towards PEG tube placement in the future Continue antibiotics   Code Status/Advance Care Planning: DNR    Code Status Orders        Start     Ordered   09/03/15 1410  Do not attempt resuscitation (DNR)   Continuous    Question Answer Comment  In the event of cardiac or respiratory ARREST Do not call a "code blue"   In the event of cardiac or respiratory ARREST Do not perform Intubation, CPR, defibrillation or ACLS   In the event of cardiac or respiratory ARREST Use medication by  any route, position, wound  care, and other measures to relive pain and suffering. May use oxygen, suction and manual treatment of airway obstruction as needed for comfort.      09/03/15 1409    Code Status History    Date Active Date Inactive Code Status Order ID Comments User Context   08/29/2015  8:09 PM 09/03/2015  2:09 PM Full Code [id number]  Gennaro Africa, MD ED      Other Directives:Advanced Directive  Symptom Management:    Continue current measures  Palliative Prophylaxis:   Delirium Protocol  Psycho-social/Spiritual:  Support System: fair  Desire for further Chaplaincy support:no Additional Recommendations: Caregiving  Support/Resources  Prognosis: Unable to determine but appears guarded due to patient's progressive Parkinson's disease, a recent aspiration events, pneumonia, urinary infection.  Discharge Planning: [place] for rehab with Palliative care service follow-up   Chief Complaint/ Primary Diagnoses: Present on Admission:  . Altered mental state . Parkinson's disease ([place]) . Cough . Metabolic encephalopathy . Fall . Failure to thrive (0-17) . Hyperammonemia ([place]) . Dementia  I have reviewed the medical record, interviewed the patient and family, and examined the patient. The following aspects are pertinent.  Past Medical History  Diagnosis Date  . 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 Unspecified transient cerebral ischemia   . Hallucinations   . Other vitamin B12 deficiency anemia   . Paralysis agitans ([place])   . Parkinson's disease ([place]) 03/27/2013   Social History   Social History  . Marital Status: Married    Spouse Name: N/A  . Number of Children: N/A  . Years of Education: N/A   Social History Main Topics  . Smoking status: Never Smoker   . Smokeless tobacco: None  . Alcohol Use: No  . Drug Use: No  . Sexual Activity: Not Asked   Other Topics Concern  . None   Social History  Narrative   Family History  Problem Relation Age of Onset  . Parkinson's disease Sister   . Cancer Brother   . Parkinson's disease Daughter 75   Scheduled Meds: . aspirin  325 mg Oral Daily  . carbidopa-levodopa  1.5 tablet Oral TID  . cefTRIAXone (ROCEPHIN)  IV  1 g Intravenous Daily  . donepezil  10 mg Oral QHS  . enoxaparin (LOVENOX) injection  40 mg Subcutaneous QHS  . guaiFENesin  1,200 mg Oral BID  . oxybutynin  5 mg Oral QHS   Continuous Infusions:  PRN Meds:.acetaminophen, acetaminophen, glycopyrrolate, RESOURCE THICKENUP CLEAR Medications Prior to Admission:  Prior to Admission medications   Medication Sig Start Date End Date Taking? Authorizing Provider  acetaminophen (TYLENOL) 500 MG tablet Take 500 mg by mouth every 6 (six) hours as needed for moderate pain.    Yes Historical Provider, MD  aspirin 325 MG tablet Take 325 mg by mouth daily.   Yes Historical Provider, MD  carbidopa-levodopa (SINEMET IR) 25-100 MG tablet Take 1.5 tablets by mouth 3 (three) times daily. 07/15/15  Yes Star Age, MD  Cholecalciferol (VITAMIN D-3) 1000 UNITS CAPS Take 1 capsule by mouth daily.   Yes Historical Provider, MD  donepezil (ARICEPT) 10 MG tablet Take 1 tablet by mouth at  bedtime 07/15/15  Yes Star Age, MD  fish oil-omega-3 fatty acids 1000 MG capsule Take 1,200 mg by mouth daily.   Yes Historical Provider, MD  multivitamin-lutein (OCUVITE-LUTEIN) CAPS capsule Take 1 capsule by mouth daily.   Yes Historical Provider, MD  oxybutynin (DITROPAN) 5 MG tablet Take  1 tablet by mouth  nightly 06/25/15  Yes Star Age, MD   No Known Allergies  Review of Systems Patient does not verbalize Physical Exam Elderly gentleman resting in bed. Does not verbalize. S1-S2 Shallow regular breathing anteriorly Abdomen mildly distended, soft Trace edema Opens eyes when name is called otherwise does not respond. Has chronic left-sided weakness. Vital Signs: BP 123/74 mmHg  Pulse 45  Temp(Src) 99.6  F (37.6 C) (Axillary)  Resp 18  Ht _0  (1.575 m)  Wt 74.6 kg (164 lb 7.4 oz)  BMI 30.07 kg/m2  SpO2 99%  SpO2: SpO2: 99 % O2 Device:SpO2: 99 % O2 Flow Rate: .O2 Flow Rate (L/min): 3 L/min  IO: Intake/output summary:  Intake/Output Summary (Last 24 hours) at 09/03/15 1413 Last data filed at 09/03/15 0700  Gross per 24 hour  Intake 1210.84 ml  Output    775 ml  Net 435.84 ml    LBM:   Baseline Weight: Weight: 72.576 kg (160 lb) Most recent weight: Weight: 74.6 kg (164 lb 7.4 oz)      Palliative Assessment/Data:  Flowsheet Rows        Most Recent Value   Intake Tab    Referral Department  Hospitalist   Unit at Time of Referral  Med/Surg Unit   Palliative Care Primary Diagnosis  Neurology   Palliative Care Type  New Palliative care   Reason for referral  Clarify Goals of Care   Date first seen by Palliative Care  09/03/15   Clinical Assessment    Palliative Performance Scale Score  30%   Pain Max last 24 hours  5   Pain Min Last 24 hours  4   Dyspnea Max Last 24 Hours  4   Dyspnea Min Last 24 hours  3   Nausea Max Last 24 Hours  4   Psychosocial & Spiritual Assessment    Palliative Care Outcomes    Patient/Family meeting held?  Yes   Who was at the meeting?  wife, grand daughter, son and daughter over the phone.    Palliative Care Outcomes  Clarified goals of care   Palliative Care follow-up planned  Yes, Facility      Additional Data Reviewed:  CBC:    Component Value Date/Time   WBC 7.5 09/03/2015 0514   HGB 10.8* 09/03/2015 0514   HCT 33.1* 09/03/2015 0514   PLT 175 09/03/2015 0514   MCV 92.7 09/03/2015 0514   NEUTROABS 14.2* 08/31/2015 2013   LYMPHSABS 0.9 08/31/2015 2013   MONOABS 0.8 08/31/2015 2013   EOSABS 0.0 08/31/2015 2013   BASOSABS 0.0 08/31/2015 2013   Comprehensive Metabolic Panel:    Component Value Date/Time   NA 144 09/03/2015 0514   K 4.6 09/03/2015 0514   CL 111 09/03/2015 0514   CO2 24 09/03/2015 0514   BUN 18 09/03/2015  0514   CREATININE 0.84 09/03/2015 0514   GLUCOSE 109* 09/03/2015 0514   CALCIUM 10.1 09/03/2015 0514   AST 33 08/31/2015 2013   ALT 7* 08/31/2015 2013   ALKPHOS 61 08/31/2015 2013   BILITOT 0.6 08/31/2015 2013   PROT 6.1* 08/31/2015 2013   ALBUMIN 3.2* 08/31/2015 2013     Time In:  1300 Time Out:  2409 Time Total:  75 Greater than 50%  of this time was spent counseling and coordinating care related to the above assessment and plan.  Signed by: Loistine Chance, MD [phone] Loistine Chance, MD  09/03/2015, 2:13 PM  Please contact Palliative Medicine  Team phone at [phone] for questions and concerns.

## 2015-09-03 NOTE — Progress Notes (Signed)
PROGRESS NOTE    Michael [name]  ZOX:[id number] DOB: [birth year] DOA: 08/29/2015 PCP: Lupe Carney, MD Outpatient Specialists:  Neurology: Dr. Frances Furbish    Brief Narrative: 82 year old male with history of? Advanced Parkinson's disease with dementia brought by EMS after sustaining a fall at home. As per wife and son patient has had gait instability and several falls over the past few weeks .patient had another fall on the day of admission, EMS was called and en route was found to be lethargic. Patient noted to spike fever of 102 on the evening of 08/31/2015 with chest x-ray concerning for pneumonia and repeat urinalysis worrisome for UTI. Patient on empiric IV antibiotics.   Assessment & Plan:   Principal Problem:   Metabolic encephalopathy Active Problems:   PNA (pneumonia)   UTI (urinary tract infection)   Parkinson's disease (HCC)   Altered mental state   Cough   Fall   Failure to thrive (0-17)   Hyperammonemia (HCC)   Dysphagia   Dementia   Encounter for palliative care   Goals of care, counseling/discussion  #1 acute metabolic encephalopathy Likely multifactorial secondary to pneumonia and UTI and possible hyperammonemia. Patient laying in bed and less alert today. Per family some improvement from admission however not at baseline. Blood cultures  with 1 out of 2 positive for coagulase negative staph likely contaminant. Sputum Gram stain and culture pending. Urine Legionella antigen pending. Urine pneumococcus antigen is negative. Urine cultures  positive for Escherichia coli and enterococcus species sensitive to third-generation cephalosporins. Change IV cefepime to IV Rocephin. Follow.  #2 pneumonia Patient noted to have a fever 08/31/2015, with chest x-ray worrisome for pneumonia. Patient had presented with a cough. Patient noted to aspirate per speech therapy and patient had a aspiration episode this morning and a such pneumonia likely secondary to aspiration pneumonia.  Sputum Gram stain and culture pending. Urine strep pneumococcus antigen negative. Urine Legionella antigen pending. Change IV cefepime to IV Rocephin. Continue Mucinex. Speech therapy following. Follow.  #3 Escherichia coli UTI Urine cultures consistent with Escherichia coli. Change IV cefepime to IV Rocephin.   #4 falls Likely secondary to Parkinson's disease/gait abnormalities. B-12 level is 556. PT/OT. Needs skilled nursing facility.  #5 dysphagia Patient s/p modified barium swallow yesterday which showed moderate oral pharyngeal dysphagia secondary to sensorimotor deficits due to Parkinson's disease. Patient did silently aspirate then mixed with secretions due to delayed pharyngeal swallow. Patient was placed on a dysphagia 3 diet with nectar thick liquids and aspiration precautions. Patient noted to have a aspiration episode today and required suctioning per SLP. Patient's diet has been changed to a full liquid diet with nectar thick liquids for now. Speech therapy following. Patient with dysphagia/drooling concern for sialorrhea from Parkinson's may be contribution. Robinul when necessary. On discharge patient will be placed on a dysphagia 3 diet with nectar thick liquids and aspiration precautions with speech therapy to follow-up the facility.  #6 dementia Likely secondary to Parkinson's disease. Continue Aricept.  #7 Parkinson's disease Patient sleeping most of the day per family. Patient with increased falls with gait abnormality. Patient also with dysphagia with increased aspiration risk. Concern for progressive Parkinson's disease. Continue Sinemet. Follow-up with neurology in the outpatient setting. Palliative care consultation pending.  #8 history of B-12 deficiency Check B-12 levels.   DVT prophylaxis: Lovenox  Code Status: Full Family Communication: Updated wife and granddaughter at bedside. Disposition Plan: To skilled nursing facility with palliative care following, when  afebrile and on oral antibiotics and culture  results have resulted.   Consultants:   Palliative Care: Dr Linna Darner 09/03/2015  Procedures:   CT head CT C-spine 08/29/2015  Chest x-ray 08/29/2015, 08/31/2015  Plain films of the pelvis 08/29/2015   Modified barium swallow 09/01/2015  Antimicrobials:   IV cefepime 08/31/2015>>>>09/03/2015  IV Rocephin 09/03/2015   Subjective: Patient sleeping in bed opens eyes to noxious stimuli. Family at bedside.  Objective: Filed Vitals:   09/02/15 1307 09/02/15 2314 09/03/15 0643 09/03/15 1256  BP: 140/66 150/79  123/74  Pulse: 62 57 68 45  Temp: 98.1 F (36.7 C) 98.9 F (37.2 C) 98.1 F (36.7 C) 99.6 F (37.6 C)  TempSrc: Oral Oral Oral Axillary  Resp: Height:      Weight:      SpO2: 100% 100%  99%    Intake/Output Summary (Last 24 hours) at 09/03/15 1720 Last data filed at 09/03/15 1246  Gross per 24 hour  Intake 1260.84 ml  Output    775 ml  Net 485.84 ml   Filed Weights   08/29/15 1633 08/29/15 2136 09/02/15 0500  Weight: 72.576 kg (160 lb) 72.7 kg (160 lb 4.4 oz) 74.6 kg (164 lb 7.4 oz)    Examination:  General exam: Appears calm and comfortable. Sitting up in chair. Grunting. Respiratory system: Decreased breath sounds in the left base with some coarse breath sounds.  Cardiovascular system: S1 & S2 heard, RRR. No JVD, murmurs, rubs, gallops or clicks. No pedal edema. Gastrointestinal system: Abdomen is nondistended, soft and nontender. No organomegaly or masses felt. Normal bowel sounds heard. Central nervous system:  No focal neurological deficits. Extremities: no clubbing cyanosis or edema.  Skin: No rashes, lesions or ulcers Psychiatry:  Mood & affect appropriate.     Data Reviewed: I have personally reviewed following labs and imaging studies  CBC:  Recent Labs Lab 08/29/15 1635 08/30/15 0450 08/31/15 2013 09/02/15 0455 09/03/15 0514  WBC 11.1* 8.5 16.0* 10.5 7.5  NEUTROABS  --  5.5  14.2*  --   --   HGB 13.1 11.6* 11.4* 10.1* 10.8*  HCT 39.1 35.3* 34.2* 30.6* 33.1*  MCV 89.5 91.2 91.0 90.0 92.7  PLT 272 251 215 171 175   Basic Metabolic Panel:  Recent Labs Lab 08/29/15 1635 08/30/15 0450 08/31/15 2013 09/02/15 0455 09/03/15 0514  NA 142 141 136 143 144  K 4.6 4.0 4.1 4.3 4.6  CL 107 108 104 112* 111  CO2 GLUCOSE 110* 114* 126* 100* 109*  BUN 23* 20 30* 24* 18  CREATININE 1.14 1.09 1.39* 1.05 0.84  CALCIUM 9.9 9.3 9.3 9.1 10.1  MG  --   --   --  1.9  --    GFR: Estimated Creatinine Clearance: 55.9 mL/min (by C-G formula based on Cr of 0.84). Liver Function Tests:  Recent Labs Lab 08/30/15 0450 08/31/15 2013  AST 20 33  ALT 14* 7*  ALKPHOS 54 61  BILITOT 0.9 0.6  PROT 6.0* 6.1*  ALBUMIN 3.3* 3.2*   No results for input(s): LIPASE, AMYLASE in the last 168 hours.  Recent Labs Lab 08/29/15 2041 08/31/15 0459 09/03/15 1146  AMMONIA 137* 26 26   Coagulation Profile: No results for input(s): INR, PROTIME in the last 168 hours. Cardiac Enzymes: No results for input(s): CKTOTAL, CKMB, CKMBINDEX, TROPONINI in the last 168 hours. BNP (last 3 results) No results for input(s): PROBNP in the last 8760 hours. HbA1C: No results for input(s): HGBA1C in the  last 72 hours. CBG:  Recent Labs Lab 09/02/15 0723 09/02/15 1214 09/02/15 1658 09/03/15 0040 09/03/15 1134  GLUCAP 88 117* 99 113* 94   Lipid Profile: No results for input(s): CHOL, HDL, LDLCALC, TRIG, CHOLHDL, LDLDIRECT in the last 72 hours. Thyroid Function Tests: No results for input(s): TSH, T4TOTAL, FREET4, T3FREE, THYROIDAB in the last 72 hours. Anemia Panel:  Recent Labs  09/01/15 1126  VITAMINB12 556   Urine analysis:    Component Value Date/Time   COLORURINE ORANGE* 08/31/2015 2151   APPEARANCEUR TURBID* 08/31/2015 2151   LABSPEC 1.027 08/31/2015 2151   PHURINE 5.0 08/31/2015 2151   GLUCOSEU NEGATIVE 08/31/2015 2151   HGBUR LARGE* 08/31/2015 2151    BILIRUBINUR SMALL* 08/31/2015 2151   KETONESUR NEGATIVE 08/31/2015 2151   PROTEINUR 100* 08/31/2015 2151   UROBILINOGEN 1.0 07/08/2009 0320   NITRITE POSITIVE* 08/31/2015 2151   LEUKOCYTESUR LARGE* 08/31/2015 2151   Sepsis Labs: (procalcitonin:4,lacticidven:4)  ) Recent Results (from the past 240 hour(s))  Urine culture     Status: None   Collection Time: 08/29/15  8:05 PM  Result Value Ref Range Status   Specimen Description URINE, CLEAN CATCH  Final   Special Requests NONE  Final   Culture   Final    NO GROWTH 1 DAY Performed at [facility]    Report Status 08/31/2015 FINAL  Final  Culture, blood (Routine X 2) w Reflex to ID Panel     Status: None (Preliminary result)   Collection Time: 08/31/15  8:14 PM  Result Value Ref Range Status   Specimen Description BLOOD RIGHT HAND  Final   Special Requests IN PEDIATRIC BOTTLE 1CC  Final   Culture   Final    NO GROWTH 3 DAYS Performed at [facility]    Report Status PENDING  Incomplete  Culture, blood (Routine X 2) w Reflex to ID Panel     Status: Abnormal   Collection Time: 08/31/15  8:14 PM  Result Value Ref Range Status   Specimen Description BLOOD LEFT HAND  Final   Special Requests BOTTLES DRAWN AEROBIC AND ANAEROBIC 1CC  Final   Culture  Setup Time   Final    GRAM POSITIVE COCCI IN CLUSTERS ANAEROBIC BOTTLE ONLY CRITICAL RESULT CALLED TO, READ BACK BY AND VERIFIED WITH: LISA [facility]  09/02/15 MKELLY    Culture (A)  Final    STAPHYLOCOCCUS SPECIES (COAGULASE NEGATIVE) THE SIGNIFICANCE OF ISOLATING THIS ORGANISM FROM A SINGLE SET OF BLOOD CULTURES WHEN MULTIPLE SETS ARE DRAWN IS UNCERTAIN. PLEASE NOTIFY THE MICROBIOLOGY DEPARTMENT WITHIN ONE WEEK IF SPECIATION AND SENSITIVITIES ARE REQUIRED. Performed at [facility]    Report Status 09/03/2015 FINAL  Final  Culture, Urine     Status: Abnormal (Preliminary result)   Collection Time: 08/31/15  9:51 PM  Result Value Ref Range  Status   Specimen Description URINE, CLEAN CATCH  Final   Special Requests NONE  Final   Culture (A)  Final    >=100,000 COLONIES/mL ESCHERICHIA COLI 80,000 COLONIES/mL ENTEROCOCCUS SPECIES    Report Status PENDING  Incomplete   Organism ID, Bacteria ESCHERICHIA COLI (A)  Final      Susceptibility   Escherichia coli - MIC*    AMPICILLIN >=32 RESISTANT Resistant     CEFAZOLIN >=64 RESISTANT Resistant     CEFTRIAXONE <=1 SENSITIVE Sensitive     CIPROFLOXACIN <=0.25 SENSITIVE Sensitive     GENTAMICIN <=1 SENSITIVE Sensitive     IMIPENEM <=0.25 SENSITIVE Sensitive  NITROFURANTOIN <=16 SENSITIVE Sensitive     TRIMETH/SULFA <=20 SENSITIVE Sensitive     AMPICILLIN/SULBACTAM >=32 RESISTANT Resistant     PIP/TAZO <=4 SENSITIVE Sensitive     * >=100,000 COLONIES/mL ESCHERICHIA COLI         Radiology Studies: No results found.      Scheduled Meds: . aspirin  325 mg Oral Daily  . carbidopa-levodopa  1.5 tablet Oral TID  . cefTRIAXone (ROCEPHIN)  IV  1 g Intravenous Daily  . donepezil  10 mg Oral QHS  . enoxaparin (LOVENOX) injection  40 mg Subcutaneous QHS  . guaiFENesin  1,200 mg Oral BID  . oxybutynin  5 mg Oral QHS   Continuous Infusions: . sodium chloride       LOS: 5 days    Time spent: 35 mins    Aanshi Batchelder, MD [facility] Pager [phone]  If 7PM-7AM, please contact night-coverage [email or web address] Password [email or web address] 09/03/2015, 5:20 PM

## 2015-09-03 NOTE — Clinical Social Work Placement (Signed)
Patient has a bed at [facility]. Awaiting Palliative Care Consult. CSW has completed FL2 & will continue to follow and assist with discharge when ready.    Michael MaxinKelly Yulia Ulrich, LCSW [facility] Clinical Social Worker cell #: [phone]     CLINICAL SOCIAL WORK PLACEMENT  NOTE  Date:  09/03/2015  Patient Details  Name: Michael [name] MRN: [id number] Date of Birth: 10/04/1927  Clinical Social Work is seeking post-discharge placement for this patient at the Skilled  Nursing Facility level of care (*CSW will initial, date and re-position this form in  chart as items are completed):  Yes   Patient/family provided with [facility] Clinical Social Work Department's list of facilities offering this level of care within the geographic area requested by the patient (or if unable, by the patient's family).  Yes   Patient/family informed of their freedom to choose among providers that offer the needed level of care, that participate in Medicare, Medicaid or managed care program needed by the patient, have an available bed and are willing to accept the patient.  Yes   Patient/family informed of [place]'s ownership interest in [facility] and [facility], as well as of the fact that they are under no obligation to receive care at these facilities.  PASRR submitted to EDS on 08/31/15     PASRR number received on 08/31/15     Existing PASRR number confirmed on       FL2 transmitted to all facilities in geographic area requested by pt/family on 08/31/15     FL2 transmitted to all facilities within larger geographic area on       Patient informed that his/her managed care company has contracts with or will negotiate with certain facilities, including the following:        Yes   Patient/family informed of bed offers received.  Patient chooses bed at [facility]     Physician recommends and patient chooses bed at      Patient to be transferred [facility] on   .  Patient to be transferred to facility by       Patient family notified on   of transfer.  Name of family member notified:        PHYSICIAN       Additional Comment:    _______________________________________________ Arlyss RepressHarrison, Rakia Frayne F, LCSW 09/03/2015, 9:45 AM

## 2015-09-04 ENCOUNTER — Inpatient Hospital Stay (HOSPITAL_COMMUNITY): Payer: Medicare Other

## 2015-09-04 DIAGNOSIS — R29810 Facial weakness: Secondary | ICD-10-CM

## 2015-09-04 LAB — BASIC METABOLIC PANEL
Anion gap: 11 (ref 5–15)
BUN: 15 mg/dL (ref 6–20)
CALCIUM: 9.3 mg/dL (ref 8.9–10.3)
CO2: 22 mmol/L (ref 22–32)
CREATININE: 0.84 mg/dL (ref 0.61–1.24)
Chloride: 106 mmol/L (ref 101–111)
GFR calc non Af Amer: >60 mL/min (ref >60)
Glucose, Bld: 84 mg/dL (ref 65–99)
Potassium: 4.2 mmol/L (ref 3.5–5.1)
SODIUM: 139 mmol/L (ref 135–145)

## 2015-09-04 LAB — GLUCOSE, CAPILLARY
GLUCOSE-CAPILLARY: 65 mg/dL (ref 65–99)
GLUCOSE-CAPILLARY: 125 mg/dL — AB (ref 65–99)
Glucose-Capillary: 107 mg/dL — ABNORMAL HIGH (ref 65–99)
Glucose-Capillary: 134 mg/dL — ABNORMAL HIGH (ref 65–99)

## 2015-09-04 LAB — CBC
HCT: 32.4 % — ABNORMAL LOW (ref 39.0–52.0)
Hemoglobin: 10.6 g/dL — ABNORMAL LOW (ref 13.0–17.0)
MCH: 29.9 pg (ref 26.0–34.0)
MCHC: 32.7 g/dL (ref 30.0–36.0)
MCV: 91.5 fL (ref 78.0–100.0)
PLATELETS: 199 10*3/uL (ref 150–400)
RBC: 3.54 MIL/uL — AB (ref 4.22–5.81)
RDW: 13.1 % (ref 11.5–15.5)
WBC: 6 10*3/uL (ref 4.0–10.5)

## 2015-09-04 LAB — URINE CULTURE: Culture: >=100000 — AB

## 2015-09-04 MED ORDER — NITROFURANTOIN MONOHYD MACRO 100 MG PO CAPS
100.0000 mg | ORAL_CAPSULE | Freq: Two times a day (BID) | ORAL | Status: DC
  Administered 2015-09-04 – 2015-09-05 (×3): 100 mg via ORAL
  Filled 2015-09-04 (×3): qty 1

## 2015-09-04 MED ORDER — DEXTROSE 50 % IV SOLN
1.0000 | Freq: Once | INTRAVENOUS | Status: AC
  Administered 2015-09-04: 50 mL via INTRAVENOUS
  Filled 2015-09-04: qty 50

## 2015-09-04 NOTE — Progress Notes (Signed)
Hypoglycemic Event  CBG: 65  Treatment: D50 IV 25 mL  Symptoms: None  Follow-up CBG: Time:0800 CBG Result: 125  Possible Reasons for Event: Inadequate meal intake  Comments/MD notified: will continue to monitor and notify MD on rounds     Michael [name], Michael [name]

## 2015-09-04 NOTE — Progress Notes (Signed)
Daily Progress Note   Patient Name: Michael [name]       Date: 09/04/2015 DOB: 05/06/1928  Age: 82 y.o. MRN#: [id number] Attending Physician: Rodolph Bong, MD Primary Care Physician: Lupe Carney, MD Admit Date: 08/29/2015  Reason for Consultation/Follow-up: Establishing goals of care  Subjective:  awake alert resting in bed.  No family at the bedside.   Interval Events:  patient is a little more awake alert than yesterday Resting in bed Monitor PO intake with precautions as set forth by speech language therapy  To SNF rehab with palliative measures RN concerned about patient's L sided facial droop, ?chronic, no other focal deficits identified, continue to monitor. CT head few days ago with no acute abnormalities, patient with history of PD, may need repeat CT head prior to d/c if worsening facial droop or focal deficits noted.  No family at bedside this am To go to [facility] on d/c  Length of Stay: 6 days  Current Medications: Scheduled Meds:  . aspirin  325 mg Oral Daily  . carbidopa-levodopa  1.5 tablet Oral TID  . cefTRIAXone (ROCEPHIN)  IV  1 g Intravenous Daily  . donepezil  10 mg Oral QHS  . enoxaparin (LOVENOX) injection  40 mg Subcutaneous QHS  . guaiFENesin  1,200 mg Oral BID  . oxybutynin  5 mg Oral QHS    Continuous Infusions: . sodium chloride 75 mL/hr at 09/04/15 0556    PRN Meds: acetaminophen, acetaminophen, glycopyrrolate, RESOURCE THICKENUP CLEAR  Physical Exam: Physical Exam             Elderly gentleman Awake alert resting in bed s1 s2 Clear anteriorly Abdomen soft Trace edema ? Chronic L facial droop, discussed with RN, continue to monitor.   Vital Signs: BP 165/71 mmHg  Pulse 56  Temp(Src) 98.4 F (36.9 C) (Axillary)  Resp 16   Ht  (1.575 m)  Wt 74.6 kg (164 lb 7.4 oz)  BMI 30.07 kg/m2  SpO2 100% SpO2: SpO2: 100 % O2 Device: O2 Device: Nasal Cannula O2 Flow Rate: O2 Flow Rate (L/min): 3 L/min  Intake/output summary:  Intake/Output Summary (Last 24 hours) at 09/04/15 0935 Last data filed at 09/04/15 0700  Gross per 24 hour  Intake 1562.5 ml  Output    550 ml  Net 1012.5 ml  LBM:   Baseline Weight: Weight: 72.576 kg (160 lb) Most recent weight: Weight: 74.6 kg (164 lb 7.4 oz)       Palliative Assessment/Data: Flowsheet Rows        Most Recent Value   Intake Tab    Referral Department  Hospitalist   Unit at Time of Referral  Med/Surg Unit   Palliative Care Primary Diagnosis  Neurology   Palliative Care Type  Return patient Palliative Care   Reason for referral  Clarify Goals of Care   Date first seen by Palliative Care  09/03/15   Clinical Assessment    Palliative Performance Scale Score  30%   Pain Max last 24 hours  5   Pain Min Last 24 hours  4   Dyspnea Max Last 24 Hours  4   Dyspnea Min Last 24 hours  3   Nausea Max Last 24 Hours  4   Psychosocial & Spiritual Assessment    Palliative Care Outcomes    Patient/Family meeting held?  No   Who was at the meeting?  wife, grand daughter, son and daughter over the phone.    Palliative Care Outcomes  Clarified goals of care   Palliative Care follow-up planned  Yes, Facility      Additional Data Reviewed: CBC    Component Value Date/Time   WBC 6.0 09/04/2015 0513   RBC 3.54* 09/04/2015 0513   HGB 10.6* 09/04/2015 0513   HCT 32.4* 09/04/2015 0513   PLT 199 09/04/2015 0513   MCV 91.5 09/04/2015 0513   MCH 29.9 09/04/2015 0513   MCHC 32.7 09/04/2015 0513   RDW 13.1 09/04/2015 0513   LYMPHSABS 0.9 08/31/2015 2013   MONOABS 0.8 08/31/2015 2013   EOSABS 0.0 08/31/2015 2013   BASOSABS 0.0 08/31/2015 2013    CMP     Component Value Date/Time   NA 139 09/04/2015 0513   K 4.2 09/04/2015 0513   CL 106 09/04/2015 0513   CO2 22  09/04/2015 0513   GLUCOSE 84 09/04/2015 0513   BUN 15 09/04/2015 0513   CREATININE 0.84 09/04/2015 0513   CALCIUM 9.3 09/04/2015 0513   PROT 6.1* 08/31/2015 2013   ALBUMIN 3.2* 08/31/2015 2013   AST 33 08/31/2015 2013   ALT 7* 08/31/2015 2013   ALKPHOS 61 08/31/2015 2013   BILITOT 0.6 08/31/2015 2013   GFRNONAA >60 09/04/2015 0513   GFRAA >60 09/04/2015 0513       Problem List:  Patient Active Problem List   Diagnosis Date Noted  . Encounter for palliative care   . Goals of care, counseling/discussion   . E-coli UTI   . PNA (pneumonia) 09/01/2015  . UTI (urinary tract infection) 09/01/2015  . Dysphagia 09/01/2015  . Dementia 09/01/2015  . Cough 08/30/2015  . Metabolic encephalopathy 08/30/2015  . Fall 08/30/2015  . Failure to thrive (0-17) 08/30/2015  . Hyperammonemia (HCC) 08/30/2015  . Altered mental state 08/29/2015  . Parkinson's disease (HCC) 03/27/2013  . Other B-complex deficiencies 09/16/2012     Palliative Care Assessment & Plan    1.Code Status:  DNR    Code Status Orders        Start     Ordered   09/03/15 1410  Do not attempt resuscitation (DNR)   Continuous    Question Answer Comment  In the event of cardiac or respiratory ARREST Do not call a "code blue"   In the event of cardiac or respiratory ARREST Do not perform Intubation, CPR, defibrillation  or ACLS   In the event of cardiac or respiratory ARREST Use medication by any route, position, wound care, and other measures to relive pain and suffering. May use oxygen, suction and manual treatment of airway obstruction as needed for comfort.      09/03/15 1409    Code Status History    Date Active Date Inactive Code Status Order ID Comments User Context   08/29/2015  8:09 PM 09/03/2015  2:09 PM Full Code [id number]  Eston Esters, MD ED       2. Goals of Care/Additional Recommendations:  Desire for further Chaplaincy support:no  Psycho-social Needs: Caregiving  Support/Resources  3.  Symptom Management:      1. Continue current mode of care.   4. Palliative Prophylaxis:   Bowel Regimen  5. Prognosis: Unable to determine but guarded due to advancing PD, dysphagia and recent infections.   6. Discharge Planning:  Skilled Nursing Facility for rehab with Palliative care service follow-up   Care plan was discussed with  Patient, RN  Thank you for allowing the Palliative Medicine Team to assist in the care of this patient.   Time In:  9 Time Out: 925 Total Time 25 Prolonged Time Billed  no       [phone]  Rosalin Hawking, MD  09/04/2015, 9:35 AM  Please contact Palliative Medicine Team phone at [phone] for questions and concerns.

## 2015-09-04 NOTE — Clinical Social Work Note (Signed)
CSW received call from Elba[facility]ennifer [facility] to determine if patient ready for discharge. Updated Victorino DikeJennifer on MD's progress note from today and disposition information. She asked to be contacted once patient medically stable for discharge at faciity number [phone]) or her cell [phone].  Genelle BalVanessa Dalesha Stanback, MSW, LCSW Licensed Clinical Social Worker Clinical Social Work Department [facility] [phone]

## 2015-09-04 NOTE — Progress Notes (Signed)
PROGRESS NOTE    Michael [name]  CWC:[id number] DOB: 1927-07-01 DOA: 08/29/2015 PCP: [name], MD Outpatient Specialists:  Neurology: Dr. [name]    Brief Narrative: 82 year old male with history of? Advanced Parkinson's disease with dementia brought by EMS after sustaining a fall at home. As per wife and son patient has had gait instability and several falls over the past few weeks .patient had another fall on the day of admission, EMS was called and en route was found to be lethargic. Patient noted to spike fever of 102 on the evening of 08/31/2015 with chest x-ray concerning for pneumonia and repeat urinalysis worrisome for UTI. Patient on empiric IV antibiotics.   Assessment & Plan:   Principal Problem:   Metabolic encephalopathy Active Problems:   PNA (pneumonia)   UTI (urinary tract infection)   Parkinson's disease (HCC)   Altered mental state   Cough   Fall   Failure to thrive (0-17)   Hyperammonemia (HCC)   Dysphagia   Dementia   Encounter for palliative care   Goals of care, counseling/discussion   E-coli UTI  #1 acute metabolic encephalopathy Likely multifactorial secondary to pneumonia and UTI and possible hyperammonemia. Patient laying in bed and less alert today. Per family some improvement from admission however not at baseline. Blood cultures  with 1 out of 2 positive for coagulase negative staph likely contaminant. Sputum Gram stain and culture pending. Urine Legionella antigen pending. Urine pneumococcus antigen is negative. Urine cultures  positive for Escherichia coli and enterococcus species sensitive to third-generation cephalosporins. Changed IV cefepime to IV Rocephin. Patient had a head CT on admission which was negative. Nurse concerned about questionable left facial droop with baseline unknown. ?? Chronic. Repeat CT head. Follow.  #2 pneumonia Patient noted to have a fever 08/31/2015, with chest x-ray worrisome for pneumonia. Patient had presented  with a cough. Patient noted to aspirate per speech therapy and patient had a aspiration episode this morning and a such pneumonia likely secondary to aspiration pneumonia. Sputum Gram stain and culture pending. Urine strep pneumococcus antigen negative. Urine Legionella antigen pending. Changed IV cefepime to IV Rocephin. Continue Mucinex. Speech therapy following. Follow.  #3 Escherichia coli UTI Urine cultures consistent with Escherichia coli. Changed IV cefepime to IV Rocephin.   #4 falls Likely secondary to Parkinson's disease/gait abnormalities. B-12 level is 556. PT/OT. Needs skilled nursing facility.  #5 dysphagia Patient s/p modified barium swallow yesterday which showed moderate oral pharyngeal dysphagia secondary to sensorimotor deficits due to Parkinson's disease. Patient did silently aspirate then mixed with secretions due to delayed pharyngeal swallow. Patient was placed on a dysphagia 3 diet with nectar thick liquids and aspiration precautions. Patient noted to have a aspiration episode today and required suctioning per SLP. Patient's diet has been changed to a full liquid diet with nectar thick liquids for now. Speech therapy following. Patient with dysphagia/drooling concern for sialorrhea from Parkinson's may be contribution. Robinul when necessary. On discharge patient will be placed on a dysphagia 3 diet with nectar thick liquids and aspiration precautions with speech therapy to follow-up the facility.  #6 dementia Likely secondary to Parkinson's disease. Continue Aricept.  #7 Parkinson's disease Patient sleeping most of the day per family. Patient with increased falls with gait abnormality. Patient also with dysphagia with increased aspiration risk. Concern for progressive Parkinson's disease. Continue Sinemet. Follow-up with neurology in the outpatient setting. Palliative care has assessed the patient and met with the family patient currently a DO NOT RESUSCITATE. Continue current  symptom management, bowel regimen and patient to be discharged to a skilled nursing facility with palliative care service following.   #8 history of B-12 deficiency B-12 levels 556.   DVT prophylaxis: Lovenox  Code Status: Full Family Communication: Updated patient at bedside. No family present.  Disposition Plan: To skilled nursing facility with palliative care following, when afebrile and on oral antibiotics and culture results have resulted.   Consultants:   Palliative Care: Dr Rowe Pavy 09/03/2015  Procedures:   CT head CT C-spine 08/29/2015  Chest x-ray 08/29/2015, 08/31/2015  Plain films of the pelvis 08/29/2015   Modified barium swallow 09/01/2015  Antimicrobials:   IV cefepime 08/31/2015>>>>09/03/2015  IV Rocephin 09/03/2015   Subjective: Patient alert eyes open with no complaints. Patient denies any chest pain no shortness of breath. Nurse concern about left facial droop. No family at bedside.   Objective: Filed Vitals:   09/03/15 0643 09/03/15 1256 09/03/15 2206 09/04/15 0423  BP:  123/74 164/55 165/71  Pulse: 68 45 56 56  Temp: 98.1 F (36.7 C) 99.6 F (37.6 C) 99.1 F (37.3 C) 98.4 F (36.9 C)  TempSrc: Oral Axillary Axillary Axillary  Resp: _0 Height:      Weight:      SpO2:  99% 100% 100%    Intake/Output Summary (Last 24 hours) at 09/04/15 1020 Last data filed at 09/04/15 0700  Gross per 24 hour  Intake 1562.5 ml  Output    550 ml  Net 1012.5 ml   Filed Weights   08/29/15 1633 08/29/15 2136 09/02/15 0500  Weight: 72.576 kg (160 lb) 72.7 kg (160 lb 4.4 oz) 74.6 kg (164 lb 7.4 oz)    Examination:  General exam: Appears calm and comfortable. Sitting up in bed. Grunting. Respiratory system: CTAB anterior lung fields.  Cardiovascular system: S1 & S2 heard, RRR. No JVD, murmurs, rubs, gallops or clicks. No pedal edema. Gastrointestinal system: Abdomen is nondistended, soft and nontender. No organomegaly or masses felt. Normal bowel  sounds heard. Central nervous system:  Slight left facial weakness ?? Chronic .No focal neurological deficits. Extremities: no clubbing cyanosis or edema.  Skin: No rashes, lesions or ulcers Psychiatry:  Mood & affect appropriate.     Data Reviewed: I have personally reviewed following labs and imaging studies  CBC:  Recent Labs Lab 08/30/15 0450 08/31/15 2013 09/02/15 0455 09/03/15 0514 09/04/15 0513  WBC 8.5 16.0* 10.5 7.5 6.0  NEUTROABS 5.5 14.2*  --   --   --   HGB 11.6* 11.4* 10.1* 10.8* 10.6*  HCT 35.3* 34.2* 30.6* 33.1* 32.4*  MCV 91.2 91.0 90.0 92.7 91.5  PLT 251 215 171 175 400   Basic Metabolic Panel:  Recent Labs Lab 08/30/15 0450 08/31/15 2013 09/02/15 0455 09/03/15 0514 09/04/15 0513  NA 141 136 143 144 139  K 4.0 4.1 4.3 4.6 4.2  CL 108 104 112* 111 106  CO2 _1 GLUCOSE 114* 126* 100* 109* 84  BUN 20 30* 24* 18 15  CREATININE 1.09 1.39* 1.05 0.84 0.84  CALCIUM 9.3 9.3 9.1 10.1 9.3  MG  --   --  1.9  --   --    GFR: Estimated Creatinine Clearance: 55.9 mL/min (by C-G formula based on Cr of 0.84). Liver Function Tests:  Recent Labs Lab 08/30/15 0450 08/31/15 2013  AST 20 33  ALT 14* 7*  ALKPHOS 54 61  BILITOT 0.9 0.6  PROT 6.0* 6.1*  ALBUMIN 3.3* 3.2*  No results for input(s): LIPASE, AMYLASE in the last 168 hours.  Recent Labs Lab 08/29/15 2041 08/31/15 0459 09/03/15 1146  AMMONIA 137* 26 26   Coagulation Profile: No results for input(s): INR, PROTIME in the last 168 hours. Cardiac Enzymes: No results for input(s): CKTOTAL, CKMB, CKMBINDEX, TROPONINI in the last 168 hours. BNP (last 3 results) No results for input(s): PROBNP in the last 8760 hours. HbA1C: No results for input(s): HGBA1C in the last 72 hours. CBG:  Recent Labs Lab 09/02/15 1658 09/03/15 0040 09/03/15 1134 09/04/15 0700 09/04/15 0800  GLUCAP 99 113* 94 65 125*   Lipid Profile: No results for input(s): CHOL, HDL, LDLCALC, TRIG, CHOLHDL,  LDLDIRECT in the last 72 hours. Thyroid Function Tests: No results for input(s): TSH, T4TOTAL, FREET4, T3FREE, THYROIDAB in the last 72 hours. Anemia Panel:  Recent Labs  09/01/15 1126  VITAMINB12 556   Urine analysis:    Component Value Date/Time   COLORURINE ORANGE* 08/31/2015 2151   APPEARANCEUR TURBID* 08/31/2015 2151   LABSPEC 1.027 08/31/2015 2151   PHURINE 5.0 08/31/2015 2151   GLUCOSEU NEGATIVE 08/31/2015 2151   HGBUR LARGE* 08/31/2015 2151   BILIRUBINUR SMALL* 08/31/2015 2151   KETONESUR NEGATIVE 08/31/2015 2151   PROTEINUR 100* 08/31/2015 2151   UROBILINOGEN 1.0 07/08/2009 0320   NITRITE POSITIVE* 08/31/2015 2151   LEUKOCYTESUR LARGE* 08/31/2015 2151   Sepsis Labs: _0 (procalcitonin:4,lacticidven:4)  ) Recent Results (from the past 240 hour(s))  Urine culture     Status: None   Collection Time: 08/29/15  8:05 PM  Result Value Ref Range Status   Specimen Description URINE, CLEAN CATCH  Final   Special Requests NONE  Final   Culture   Final    NO GROWTH 1 DAY Performed at [facility]    Report Status 08/31/2015 FINAL  Final  Culture, blood (Routine X 2) w Reflex to ID Panel     Status: None (Preliminary result)   Collection Time: 08/31/15  8:14 PM  Result Value Ref Range Status   Specimen Description BLOOD RIGHT HAND  Final   Special Requests IN PEDIATRIC BOTTLE Jane  Final   Culture   Final    NO GROWTH 3 DAYS Performed at [facility]    Report Status PENDING  Incomplete  Culture, blood (Routine X 2) w Reflex to ID Panel     Status: Abnormal   Collection Time: 08/31/15  8:14 PM  Result Value Ref Range Status   Specimen Description BLOOD LEFT HAND  Final   Special Requests BOTTLES DRAWN AEROBIC AND ANAEROBIC 1CC  Final   Culture  Setup Time   Final    GRAM POSITIVE COCCI IN CLUSTERS ANAEROBIC BOTTLE ONLY CRITICAL RESULT CALLED TO, READ BACK BY AND VERIFIED WITH: LISA WILFONG,RN _1  09/02/15 MKELLY    Culture (A)  Final     STAPHYLOCOCCUS SPECIES (COAGULASE NEGATIVE) THE SIGNIFICANCE OF ISOLATING THIS ORGANISM FROM A SINGLE SET OF BLOOD CULTURES WHEN MULTIPLE SETS ARE DRAWN IS UNCERTAIN. PLEASE NOTIFY THE MICROBIOLOGY DEPARTMENT WITHIN ONE WEEK IF SPECIATION AND SENSITIVITIES ARE REQUIRED. Performed at [facility]    Report Status 09/03/2015 FINAL  Final  Culture, Urine     Status: Abnormal   Collection Time: 08/31/15  9:51 PM  Result Value Ref Range Status   Specimen Description URINE, CLEAN CATCH  Final   Special Requests NONE  Final   Culture (A)  Final    >=100,000 COLONIES/mL ESCHERICHIA COLI 80,000 COLONIES/mL ENTEROCOCCUS SPECIES  Report Status 09/04/2015 FINAL  Final   Organism ID, Bacteria ESCHERICHIA COLI (A)  Final   Organism ID, Bacteria ENTEROCOCCUS SPECIES (A)  Final      Susceptibility   Escherichia coli - MIC*    AMPICILLIN >=32 RESISTANT Resistant     CEFAZOLIN >=64 RESISTANT Resistant     CEFTRIAXONE <=1 SENSITIVE Sensitive     CIPROFLOXACIN <=0.25 SENSITIVE Sensitive     GENTAMICIN <=1 SENSITIVE Sensitive     IMIPENEM <=0.25 SENSITIVE Sensitive     NITROFURANTOIN <=16 SENSITIVE Sensitive     TRIMETH/SULFA <=20 SENSITIVE Sensitive     AMPICILLIN/SULBACTAM >=32 RESISTANT Resistant     PIP/TAZO <=4 SENSITIVE Sensitive     * >=100,000 COLONIES/mL ESCHERICHIA COLI   Enterococcus species - MIC*    AMPICILLIN <=2 SENSITIVE Sensitive     LEVOFLOXACIN 1 SENSITIVE Sensitive     NITROFURANTOIN <=16 SENSITIVE Sensitive     VANCOMYCIN 1 SENSITIVE Sensitive     * 80,000 COLONIES/mL ENTEROCOCCUS SPECIES         Radiology Studies: No results found.      Scheduled Meds: . aspirin  325 mg Oral Daily  . carbidopa-levodopa  1.5 tablet Oral TID  . cefTRIAXone (ROCEPHIN)  IV  1 g Intravenous Daily  . donepezil  10 mg Oral QHS  . enoxaparin (LOVENOX) injection  40 mg Subcutaneous QHS  . guaiFENesin  1,200 mg Oral BID  . oxybutynin  5 mg Oral QHS   Continuous Infusions: .  sodium chloride 75 mL/hr at 09/04/15 0556     LOS: 6 days    Time spent: 39 mins    THOMPSON,DANIEL, MD [facility] Pager [phone]  If 7PM-7AM, please contact night-coverage [email or web address] Password [email or web address] 09/04/2015, 10:20 AM

## 2015-09-05 DIAGNOSIS — N390 Urinary tract infection, site not specified: Secondary | ICD-10-CM

## 2015-09-05 DIAGNOSIS — Z515 Encounter for palliative care: Secondary | ICD-10-CM

## 2015-09-05 DIAGNOSIS — B952 Enterococcus as the cause of diseases classified elsewhere: Secondary | ICD-10-CM

## 2015-09-05 LAB — BASIC METABOLIC PANEL
Anion gap: 8 (ref 5–15)
BUN: 14 mg/dL (ref 6–20)
BUN: 14 mg/dL (ref 4–21)
CALCIUM: 9.8 mg/dL (ref 8.9–10.3)
CHLORIDE: 108 mmol/L (ref 101–111)
CO2: 26 mmol/L (ref 22–32)
CREATININE: 0.83 mg/dL (ref 0.61–1.24)
Creatinine: 0.8 mg/dL (ref 0.6–1.3)
GFR calc Af Amer: >60 mL/min (ref >60)
GFR calc non Af Amer: >60 mL/min (ref >60)
GLUCOSE: 106 mg/dL — AB (ref 65–99)
Glucose: 106 mg/dL
Potassium: 4 mmol/L (ref 3.5–5.1)
Sodium: 142 mmol/L (ref 135–145)
Sodium: 142 mmol/L (ref 137–147)

## 2015-09-05 LAB — CBC
HEMATOCRIT: 31.9 % — AB (ref 39.0–52.0)
Hemoglobin: 10.6 g/dL — ABNORMAL LOW (ref 13.0–17.0)
MCH: 29.9 pg (ref 26.0–34.0)
MCHC: 33.2 g/dL (ref 30.0–36.0)
MCV: 89.9 fL (ref 78.0–100.0)
PLATELETS: 230 10*3/uL (ref 150–400)
RBC: 3.55 MIL/uL — ABNORMAL LOW (ref 4.22–5.81)
RDW: 12.9 % (ref 11.5–15.5)
WBC: 5.2 10*3/uL (ref 4.0–10.5)

## 2015-09-05 LAB — CULTURE, BLOOD (ROUTINE X 2): Culture: NO GROWTH

## 2015-09-05 LAB — GLUCOSE, CAPILLARY: GLUCOSE-CAPILLARY: 105 mg/dL — AB (ref 65–99)

## 2015-09-05 LAB — CBC AND DIFFERENTIAL: WBC: 5.2 10^3/mL

## 2015-09-05 MED ORDER — RESOURCE THICKENUP CLEAR PO POWD: 1.0000 g | ORAL | Status: DC | PRN

## 2015-09-05 MED ORDER — CEFPODOXIME PROXETIL 200 MG PO TABS
200.0000 mg | ORAL_TABLET | Freq: Two times a day (BID) | ORAL | Status: DC
  Administered 2015-09-05: 200 mg via ORAL
  Filled 2015-09-05: qty 1

## 2015-09-05 MED ORDER — GUAIFENESIN ER 600 MG PO TB12: 1200.0000 mg | ORAL_TABLET | Freq: Two times a day (BID) | ORAL | Status: DC

## 2015-09-05 MED ORDER — NITROFURANTOIN MONOHYD MACRO 100 MG PO CAPS: 100.0000 mg | ORAL_CAPSULE | Freq: Two times a day (BID) | ORAL | Status: DC

## 2015-09-05 MED ORDER — CEFPODOXIME PROXETIL 200 MG PO TABS: 200.0000 mg | ORAL_TABLET | Freq: Two times a day (BID) | ORAL | Status: DC

## 2015-09-05 NOTE — Progress Notes (Signed)
Pt's IV discontinued due to leaking and swelling near IV site. NP on call K. Schorr, paged regarding necessity to start a new IV despite being D/C'd later today. Will continue to monitor pt closely. Mardene CelesteAsaro, Arleen Bar I

## 2015-09-05 NOTE — Clinical Social Work Placement (Signed)
   CLINICAL SOCIAL WORK PLACEMENT  NOTE  Date:  09/05/2015  Patient Details  Name: Michael [name] MRN: [id number] Date of Birth: 23-Jun-1927  Clinical Social Work is seeking post-discharge placement for this patient at the Skilled  Nursing Facility level of care (*CSW will initial, date and re-position this form in  chart as items are completed):  Yes   Patient/family provided with [place] Clinical Social Work Department's list of facilities offering this level of care within the geographic area requested by the patient (or if unable, by the patient's family).  Yes   Patient/family informed of their freedom to choose among providers that offer the needed level of care, that participate in Medicare, Medicaid or managed care program needed by the patient, have an available bed and are willing to accept the patient.  Yes   Patient/family informed of [facility]'s ownership interest in [facility] and [facility], as well as of the fact that they are under no obligation to receive care at these facilities.  PASRR submitted to EDS on 08/31/15     PASRR number received on 08/31/15     Existing PASRR number confirmed on       FL2 transmitted to all facilities in geographic area requested by pt/family on 08/31/15     FL2 transmitted to all facilities within larger geographic area on       Patient informed that his/her managed care company has contracts with or will negotiate with certain facilities, including the following:        Yes   Patient/family informed of bed offers received.  [facility] bed [facility]     Physician recommends and patient chooses bed at      [facility] on 09/05/15.  Patient to be transferred to facility by [facility]     Patient family notified on 09/05/15 of transfer.  Name of family member notified:  Hassan RowanLanesa Gaulden     PHYSICIAN       Additional Comment:     _______________________________________________ Rod MaeVaughn, Zakariyya Helfman S, LCSW 09/05/2015, 11:53 AM

## 2015-09-05 NOTE — Clinical Social Work Note (Signed)
Patient to be discharged to [facility]. Patient's granddaughter updated regarding discharge. Patient to be transported via [facility] (approximately 1pm). RN report number: [phone]  Marcelline Deistmily Judye Lorino, LCSW [phone] Orthopedics: [phone] Surgical: [phone]

## 2015-09-05 NOTE — Progress Notes (Signed)
Michael [name] from [facility] returned my call and report was given, [facility]

## 2015-09-05 NOTE — Discharge Summary (Signed)
Physician Discharge Summary  Michael [name] MPN:[id number] DOB: Feb 12, 1928 DOA: 08/29/2015  PCP: [name], MD  Admit date: 08/29/2015 Discharge date: 09/05/2015  Time spent: 65 minutes  Recommendations for Outpatient Follow-up:  1. Patient be discharged to skilled nursing facility at [facility] with palliative care to follow. Patient also need speech therapy to follow-up at facility as patient is being discharged on a dysphagia 3 diet with nectar thick liquids and aspiration precaution. 2. Follow-up with M.D. at skilled nursing facility.   Discharge Diagnoses:  Principal Problem:   Metabolic encephalopathy Active Problems:   PNA (pneumonia)   UTI (urinary tract infection)   Parkinson's disease (HCC)   Altered mental state   Cough   Fall   Failure to thrive (0-17)   Hyperammonemia (HCC)   Dysphagia   Dementia   Encounter for palliative care   Goals of care, counseling/discussion   E-coli UTI   Facial droop   Enterococcus UTI   Discharge Condition: Stable and improved  Diet recommendation: Dysphagia 3 diet with nectar thick liquids and aspiration precautions.  Filed Weights   08/29/15 1633 08/29/15 2136 09/02/15 0500  Weight: 72.576 kg (160 lb) 72.7 kg (160 lb 4.4 oz) 74.6 kg (164 lb 7.4 oz)    History of present illness:  Per Dr Dreama Saa Patient is a 82 yo male with history of PD, early dementia who was brought with cc of fall. Patient has had issues with imbalance for the past month with several falls over the past few weeks. He also has had decline is his mental state with less interactive communication with family and sleeping more. He was not complaining of dizziness or vertigo. No focal complaints. No chest pain. No vomiting or diarrhea. No other complaints per wife and grandaughter as history was taken from them. Patient was very sleepy that whole time and could not say any words. At baseline he is not very interactive but would still talk and answer questions  appropriately.   Review of Systems:  Can't assess due to mental status, per HPI from family.   Hospital Course:  #1 acute metabolic encephalopathy Likely multifactorial secondary to pneumonia and UTI and possible hyperammonemia. Per family some improvement from admission and close to baseline. Blood cultures with 1 out of 2 positive for coagulase negative staph likely contaminant. Sputum Gram stain and culture pending. Urine Legionella antigen was negative. Urine pneumococcus antigen was negative. Urine cultures positive for Escherichia coli and enterococcus species sensitive to third-generation cephalosporins. Patient was placed on IV cefepime and transitioned to IV Rocephin. CT head on admission was negative however due to concern for possible left facial droop which is likely chronic repeat head CT was done which was also negative. Patient improved clinically and was likely close to baseline by day of discharge. Patient be discharged on oral antibiotics for treatment of pneumonia and urinary tract infection. Patient was discharged in stable and improved condition.  #2 pneumonia Patient noted to have a fever 08/31/2015, with chest x-ray worrisome for pneumonia. Patient had presented with a cough. Patient noted to aspirate per speech therapy and patient had a aspiration episode during the hospitalization, and as such pneumonia likely secondary to aspiration pneumonia. Sputum Gram stain and culture pending with no growth to date. Urine strep pneumococcus antigen negative. Urine Legionella antigen negative. Patient was initially placed empirically on IV cefepime and subsequently transitioned to IV Rocephin. Patient was also maintained on Mucinex. Patient was subsequently transitioned to oral Vantin and will be discharged on  3 more days of oral Vantin to complete a course of antibiotic treatment.   #3 Escherichia coli and enterococcus UTI Urine cultures consistent with Escherichia coli and  enterococcus. Patient was initially on IV cefepime and transitioned to IV Rocephin. Macrobid was also added to the regimen as Escherichia coli was penicillin resistant however sensitive to third-generation cephalosporins, quinolones, Macrobid, Bactrim. Enterococcus species was sensitive to ampicillin, Levaquin, Macrobid, vancomycin. Patient will be discharged on 6 more days of oral Macrobid to complete a course of antibiotic treatment.  #4 falls Likely secondary to Parkinson's disease/gait abnormalities. B-12 level is 556. PT/OT. Needs skilled nursing facility.  #5 dysphagia Patient s/p modified barium swallow yesterday which showed moderate oral pharyngeal dysphagia secondary to sensorimotor deficits due to Parkinson's disease. Patient did silently aspirate then mixed with secretions due to delayed pharyngeal swallow. Patient was placed on a dysphagia 3 diet with nectar thick liquids and aspiration precautions. Patient noted to have a aspiration episode today and required suctioning per SLP. Patient's diet was changed to a full liquid diet with nectar thick liquids. Speech therapy following. Patient with dysphagia/drooling concern for sialorrhea from Parkinson's may be contribution. Robinul when necessary. On discharge patient will be placed on a dysphagia 3 diet with nectar thick liquids and aspiration precautions with speech therapy to follow-up the facility.  #6 dementia Likely secondary to Parkinson's disease. Continued on home regimen of Aricept.  #7 Parkinson's disease Patient sleeping most of the day per family. Patient with increased falls with gait abnormality. Patient also with dysphagia with increased aspiration risk. Concern for progressive Parkinson's disease. Patient was maintained on Sinemet. Follow-up with neurology in the outpatient setting. Palliative care has assessed the patient and met with the family patient currently a DO NOT RESUSCITATE. Continued on symptom management, bowel  regimen and patient to be discharged to a skilled nursing facility with palliative care service following.   #8 history of B-12 deficiency B-12 levels 556.  #9 IV site infiltration leading to right upper extremity swelling Patient noted to have infiltration of the IV site the night prior to discharge and noted to have a right upper extremity swelling. Right upper extremity was not warm and was not erythematous. Patient is instructed to keep right upper extremity elevated.     Procedures:  CT head CT C-spine 08/29/2015  Chest x-ray 08/29/2015, 08/31/2015  Plain films of the pelvis 08/29/2015   Modified barium swallow 09/01/2015  CT head 09/04/2015  Consultations:  Palliative Care: Dr Rowe Pavy 09/03/2015  Discharge Exam: Filed Vitals:   09/04/15 2115 09/05/15 0539  BP: 136/72 130/65  Pulse: 58 60  Temp: 98.1 F (36.7 C) 98 F (36.7 C)  Resp: 16 16    General: NAD Cardiovascular: RRR Respiratory: CTAB  Discharge Instructions   Discharge Instructions    Diet general    Complete by:  As directed   Dysphagia 3 diet with nectar thick liquids and asopiration precautions.     Discharge instructions    Complete by:  As directed   Follow up with Palliative Care at facility. Follow up with SLP at facility.     Increase activity slowly    Complete by:  As directed           Current Discharge Medication List    START taking these medications   Details  cefpodoxime (VANTIN) 200 MG tablet Take 1 tablet (200 mg total) by mouth every 12 (twelve) hours. TAKE FOR 3 DAYS THEN STOP. Qty: 6 tablet, Refills: 0  guaiFENesin (MUCINEX) 600 MG 12 hr tablet Take 2 tablets (1,200 mg total) by mouth 2 (two) times daily. TAKE FOR 3 DAYS THEN STOP. Qty: 12 tablet, Refills: 0    Maltodextrin-Xanthan Gum (RESOURCE THICKENUP CLEAR) POWD Take 1 g by mouth as needed.    nitrofurantoin, macrocrystal-monohydrate, (MACROBID) 100 MG capsule Take 1 capsule (100 mg total) by mouth every 12  (twelve) hours. TAKE FOR 6 DAYS THEN STOP. Qty: 12 capsule, Refills: 0      CONTINUE these medications which have NOT CHANGED   Details  acetaminophen (TYLENOL) 500 MG tablet Take 500 mg by mouth every 6 (six) hours as needed for moderate pain.     aspirin 325 MG tablet Take 325 mg by mouth daily.    carbidopa-levodopa (SINEMET IR) 25-100 MG tablet Take 1.5 tablets by mouth 3 (three) times daily. Qty: 540 tablet, Refills: 3    Cholecalciferol (VITAMIN D-3) 1000 UNITS CAPS Take 1 capsule by mouth daily.    donepezil (ARICEPT) 10 MG tablet Take 1 tablet by mouth at  bedtime Qty: 90 tablet, Refills: 3    fish oil-omega-3 fatty acids 1000 MG capsule Take 1,200 mg by mouth daily.    multivitamin-lutein (OCUVITE-LUTEIN) CAPS capsule Take 1 capsule by mouth daily.    oxybutynin (DITROPAN) 5 MG tablet Take 1 tablet by mouth  nightly Qty: 90 tablet, Refills: 1       No Known Allergies Follow-up Information    Follow up with [facility] .   Specialty:  [place] information:   [address] [place] [phone]      Please follow up.   Why:  f/u with MD at SNF       The results of significant diagnostics from this hospitalization (including imaging, microbiology, ancillary and laboratory) are listed below for reference.    Significant Diagnostic Studies: Dg Pelvis 1-2 Views  08/29/2015  CLINICAL DATA:  Fall, unwitnessed. EXAM: PELVIS - 1-2 VIEW COMPARISON:  None. FINDINGS: No fracture or diastasis in the pelvis. No evidence of hip dislocation on this frontal view. Degenerative changes in the visualized lower lumbar spine. Diffuse osteopenia. No focal osseous lesions. IMPRESSION: No fracture or diastasis in the pelvis.  Diffuse osteopenia. Electronically Signed   By: Ilona Sorrel M.D.   On: 08/29/2015 18:02   Ct Head Wo Contrast  09/04/2015  CLINICAL DATA:  82 year old male with Parkinson's disease and dementia  status post fall. EXAM: CT HEAD WITHOUT CONTRAST TECHNIQUE: Contiguous axial images were obtained from the base of the skull through the vertex without intravenous contrast. COMPARISON:  Recent prior CT scan of the head 08/29/2015 FINDINGS: Negative for acute intracranial hemorrhage, acute infarction, mass, mass effect, hydrocephalus or midline shift. Gray-white differentiation is preserved throughout. Stable advanced atrophy. Mild ex vacuo dilatation of the lateral ventricles. No acute soft tissue or calvarial abnormality. Normal aeration of the mastoid air cells and visualized paranasal sinus. Advanced atherosclerotic calcification of the bilateral cavernous and supraclinoid carotid arteries. IMPRESSION: 1. No acute intracranial process. 2. Stable advanced atrophy with and mild chronic microvascular ischemic white matter disease. Electronically Signed   By: Jacqulynn Cadet M.D.   On: 09/04/2015 10:37   Ct Head Wo Contrast  08/29/2015  CLINICAL DATA:  Patient status post fall. History of Parkinson's. Confusion. No reported loss consciousness. EXAM: CT HEAD WITHOUT CONTRAST CT CERVICAL SPINE WITHOUT CONTRAST TECHNIQUE: Multidetector CT imaging of the head and cervical spine was performed following the  standard protocol without intravenous contrast. Multiplanar CT image reconstructions of the cervical spine were also generated. COMPARISON:  MRI brain 07/08/2009 FINDINGS: CT HEAD FINDINGS Ventricles and sulci are appropriate for patient's age. Periventricular and subcortical white matter hypodensity compatible with chronic microvascular ischemic changes. Orbits are unremarkable. Scalp soft tissues are unremarkable. Paranasal sinuses are well aerated. Mastoid air cells are unremarkable. Calvarium is intact. CT CERVICAL SPINE FINDINGS Normal anatomic alignment. Osseous fusion of the C2-3 and C3-4 levels. Additionally there is grade 1 anterolisthesis of C6-7 likely secondary to facet degenerative changes and fusion  at this level. There is right-sided facet fusion at the C6-7 and C7-T1 levels. The craniocervical junction is intact. Relative preservation of the vertebral body heights. IMPRESSION: No acute intracranial process. No acute cervical spine fracture. Chronic microvascular ischemic changes. Multilevel cervical spinal fusion and associated degenerative changes. Electronically Signed   By: Lovey Newcomer M.D.   On: 08/29/2015 18:14   Ct Cervical Spine Wo Contrast  08/29/2015  CLINICAL DATA:  Patient status post fall. History of Parkinson's. Confusion. No reported loss consciousness. EXAM: CT HEAD WITHOUT CONTRAST CT CERVICAL SPINE WITHOUT CONTRAST TECHNIQUE: Multidetector CT imaging of the head and cervical spine was performed following the standard protocol without intravenous contrast. Multiplanar CT image reconstructions of the cervical spine were also generated. COMPARISON:  MRI brain 07/08/2009 FINDINGS: CT HEAD FINDINGS Ventricles and sulci are appropriate for patient's age. Periventricular and subcortical white matter hypodensity compatible with chronic microvascular ischemic changes. Orbits are unremarkable. Scalp soft tissues are unremarkable. Paranasal sinuses are well aerated. Mastoid air cells are unremarkable. Calvarium is intact. CT CERVICAL SPINE FINDINGS Normal anatomic alignment. Osseous fusion of the C2-3 and C3-4 levels. Additionally there is grade 1 anterolisthesis of C6-7 likely secondary to facet degenerative changes and fusion at this level. There is right-sided facet fusion at the C6-7 and C7-T1 levels. The craniocervical junction is intact. Relative preservation of the vertebral body heights. IMPRESSION: No acute intracranial process. No acute cervical spine fracture. Chronic microvascular ischemic changes. Multilevel cervical spinal fusion and associated degenerative changes. Electronically Signed   By: Lovey Newcomer M.D.   On: 08/29/2015 18:14   Dg Chest Port 1 View  08/31/2015  CLINICAL DATA:   Fever. EXAM: PORTABLE CHEST 1 VIEW COMPARISON:  August 29, 2015. FINDINGS: Stable cardiomediastinal silhouette. No pneumothorax is noted. Right lung is clear. Increased left basilar opacity is noted consistent with worsening atelectasis or possibly pneumonia. Minimal left pleural effusion is noted. Old right rib fractures are noted. Atherosclerosis thoracic aorta is noted. IMPRESSION: Increased left basilar opacity is noted concerning for worsening atelectasis or pneumonia. Possible minimal left pleural effusion is noted as well. Electronically Signed   By: Marijo Conception, M.D.   On: 08/31/2015 20:13   Portable Chest 1 View  08/29/2015  CLINICAL DATA:  Patient with multiple unwitnessed falls. Right-sided rib pain. Initial encounter. EXAM: PORTABLE CHEST 1 VIEW COMPARISON:  Chest radiograph 07/08/2009 FINDINGS: Monitoring leads overlie the patient. Low lung volumes. Stable cardiac and mediastinal contours. Minimal heterogeneous opacities left lung base. No pleural effusion or pneumothorax. Age-indeterminate but likely chronic right fourth and fifth rib fractures. IMPRESSION: Age-indeterminate but likely chronic right fourth and fifth rib fractures. Low lung volumes with basilar opacities favored to represent atelectasis. Electronically Signed   By: Lovey Newcomer M.D.   On: 08/29/2015 20:57   Dg Swallowing Func-speech Pathology  09/01/2015  Objective Swallowing Evaluation: Type of Study: MBS-Modified Barium Swallow Study Patient Details Name: Michael [name] MRN:  [id number] Date of Birth: 1927-05-31 Today's Date: 09/01/2015 Time: SLP Start Time (ACUTE ONLY): 1310-SLP Stop Time (ACUTE ONLY): 1340 SLP Time Calculation (min) (ACUTE ONLY): 30 min Past Medical History: Past Medical History Diagnosis Date . 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 Unspecified transient cerebral ischemia  . Hallucinations  . Other vitamin B12 deficiency anemia  . Paralysis agitans ([place])  . Parkinson's  disease ([place]) 03/27/2013 Past Surgical History: Past Surgical History Procedure Laterality Date . Cholecystectomy   . Transurethral resection of prostate   . Appendectomy   . Ptca  1987 HPI: pt is an 82 yo male with h/p Parkinson's disease admitted to hospital with AMS, acute encephalopathy, lethargy.  RN reports pt's ammonia levels are off.  Pt has had multiple recent falls- has rib fractures.  CXR 4/16 favors ATX,  but pt spiked temps overnight and CXR worsening.  CT head negative for acute change.  MBS indicated.  Subjective: pt awake in chair Assessment / Plan / Recommendation CHL IP CLINICAL IMPRESSIONS 09/01/2015 Therapy Diagnosis Moderate oral phase dysphagia;Moderate pharyngeal phase dysphagia;Mild cervical esophageal phase dysphagia Clinical Impression Pt presents with moderate oropharyngeal dysphagia characterized by sensorimotor deficits due to his Parkinson's disease. Weakness and sensory deficits result in delayed oral transiting, lingual pumping and delayed pharyngeal swallow response. Mild oropharyngeal residuals present without pt awareness. Cued dry swallows were difficult for him to conduct but were effective when achieved. Pt did tracely SILENTLY aspirate thin mixed with secretions due to delayed pharyngeal swallow. Aspirates cleared with cued cough. Pt did have retained secretions in mouth and wet voice prior to starting MBS - suspect aspiration of secretions ongoing.Of note, pt also appeared with decreased CP compliance = allowing minimal backflow at proximal esophagus without pt awareness. This could also contribute to pt's aspiration risk. Recommendations Continue Dys3/nectar diet. Medicine with applesauce (crush if large and not contraindicated).Liquids via cup , follow solids with liquids, intermittent dry swallow, oral care after meals, cough if voice gurgly Follow up SLP at next venue of care for dysphagia management.  Impact on safety and function Moderate aspiration risk;Severe  aspiration risk   CHL IP TREATMENT RECOMMENDATION 09/01/2015 Treatment Recommendations Therapy as outlined in treatment plan below   Prognosis 09/01/2015 Prognosis for Safe Diet Advancement Fair Barriers to Reach Goals Time post onset Barriers/Prognosis Comment -- CHL IP DIET RECOMMENDATION 09/01/2015 SLP Diet Recommendations Ice chips PRN after oral care;Dysphagia 3 (Mech soft) solids;Nectar thick liquid Liquid Administration via -- Medication Administration Crushed with puree Compensations Minimize environmental distractions;Slow rate;Small sips/bites;Follow solids with liquid;Hard cough after swallow Postural Changes --   No flowsheet data found.  CHL IP FOLLOW UP RECOMMENDATIONS 09/01/2015 Follow up Recommendations Home health SLP   CHL IP FREQUENCY AND DURATION 09/01/2015 Speech Therapy Frequency (ACUTE ONLY) min 2x/week Treatment Duration 2 weeks      CHL IP ORAL PHASE 09/01/2015 Oral Phase Impaired Oral - Pudding Teaspoon -- Oral - Pudding Cup -- Oral - Honey Teaspoon -- Oral - Honey Cup -- Oral - Nectar Teaspoon Weak lingual manipulation;Lingual pumping;Piecemeal swallowing;Delayed oral transit Oral - Nectar Cup Weak lingual manipulation;Lingual pumping;Piecemeal swallowing;Delayed oral transit;Premature spillage Oral - Nectar Straw -- Oral - Thin Teaspoon Weak lingual manipulation;Lingual pumping;Reduced posterior propulsion;Piecemeal swallowing;Delayed oral transit;Decreased bolus cohesion Oral - Thin Cup Weak lingual manipulation;Reduced posterior propulsion;Delayed oral transit;Premature spillage Oral - Thin Straw Weak lingual manipulation;Lingual pumping;Reduced posterior propulsion;Lingual/palatal residue;Delayed oral transit Oral - Puree Weak lingual manipulation;Lingual pumping;Lingual/palatal residue;Piecemeal swallowing;Delayed oral transit Oral - Mech Soft --  Oral - Regular Impaired mastication;Weak lingual manipulation;Lingual pumping;Lingual/palatal residue;Piecemeal swallowing;Delayed oral transit  Oral - Multi-Consistency -- Oral - Pill -- Oral Phase - Comment pt without awareness to mild oral residuals  CHL IP PHARYNGEAL PHASE 09/01/2015 Pharyngeal Phase Impaired Pharyngeal- Pudding Teaspoon -- Pharyngeal -- Pharyngeal- Pudding Cup -- Pharyngeal -- Pharyngeal- Honey Teaspoon -- Pharyngeal -- Pharyngeal- Honey Cup -- Pharyngeal -- Pharyngeal- Nectar Teaspoon Delayed swallow initiation-pyriform sinuses Pharyngeal -- Pharyngeal- Nectar Cup Delayed swallow initiation-pyriform sinuses Pharyngeal -- Pharyngeal- Nectar Straw -- Pharyngeal -- Pharyngeal- Thin Teaspoon Delayed swallow initiation-pyriform sinuses Pharyngeal -- Pharyngeal- Thin Cup Delayed swallow initiation-pyriform sinuses Pharyngeal -- Pharyngeal- Thin Straw Delayed swallow initiation-pyriform sinuses Pharyngeal -- Pharyngeal- Puree Delayed swallow initiation-vallecula Pharyngeal -- Pharyngeal- Mechanical Soft -- Pharyngeal -- Pharyngeal- Regular Delayed swallow initiation-vallecula Pharyngeal -- Pharyngeal- Multi-consistency -- Pharyngeal -- Pharyngeal- Pill -- Pharyngeal -- Pharyngeal Comment --  CHL IP CERVICAL ESOPHAGEAL PHASE 09/01/2015 Cervical Esophageal Phase Impaired Pudding Teaspoon -- Pudding Cup -- Honey Teaspoon -- Honey Cup -- Nectar Teaspoon Reduced cricopharyngeal relaxation;Esophageal backflow into cervical esophagus Nectar Cup Reduced cricopharyngeal relaxation;Esophageal backflow into cervical esophagus Nectar Straw -- Thin Teaspoon Reduced cricopharyngeal relaxation;Esophageal backflow into cervical esophagus Thin Cup Reduced cricopharyngeal relaxation;Esophageal backflow into cervical esophagus Thin Straw Reduced cricopharyngeal relaxation;Esophageal backflow into cervical esophagus Puree -- Mechanical Soft -- Regular -- Multi-consistency -- Pill -- Cervical Esophageal Comment pt without senstion to appearance of mild backflow Luanna Salk, MS [facility] SLP [phone]               Microbiology: Recent Results (from the past 240  hour(s))  Urine culture     Status: None   Collection Time: 08/29/15  8:05 PM  Result Value Ref Range Status   Specimen Description URINE, CLEAN CATCH  Final   Special Requests NONE  Final   Culture   Final    NO GROWTH 1 DAY Performed at [facility]    Report Status 08/31/2015 FINAL  Final  Culture, blood (Routine X 2) w Reflex to ID Panel     Status: None (Preliminary result)   Collection Time: 08/31/15  8:14 PM  Result Value Ref Range Status   Specimen Description BLOOD RIGHT HAND  Final   Special Requests IN PEDIATRIC BOTTLE [place]  Final   Culture   Final    NO GROWTH 4 DAYS Performed at [facility]    Report Status PENDING  Incomplete  Culture, blood (Routine X 2) w Reflex to ID Panel     Status: Abnormal   Collection Time: 08/31/15  8:14 PM  Result Value Ref Range Status   Specimen Description BLOOD LEFT HAND  Final   Special Requests BOTTLES DRAWN AEROBIC AND ANAEROBIC 1CC  Final   Culture  Setup Time   Final    GRAM POSITIVE COCCI IN CLUSTERS ANAEROBIC BOTTLE ONLY CRITICAL RESULT CALLED TO, READ BACK BY AND VERIFIED WITH: LISA WILFONG,RN '@[id number]'$  09/02/15 MKELLY    Culture (A)  Final    STAPHYLOCOCCUS SPECIES (COAGULASE NEGATIVE) THE SIGNIFICANCE OF ISOLATING THIS ORGANISM FROM A SINGLE SET OF BLOOD CULTURES WHEN MULTIPLE SETS ARE DRAWN IS UNCERTAIN. PLEASE NOTIFY THE MICROBIOLOGY DEPARTMENT WITHIN ONE WEEK IF SPECIATION AND SENSITIVITIES ARE REQUIRED. Performed at [facility]    Report Status 09/03/2015 FINAL  Final  Culture, Urine     Status: Abnormal   Collection Time: 08/31/15  9:51 PM  Result Value Ref Range Status   Specimen Description URINE, CLEAN CATCH  Final   Special Requests  NONE  Final   Culture (A)  Final    >=100,000 COLONIES/mL ESCHERICHIA COLI 80,000 COLONIES/mL ENTEROCOCCUS SPECIES    Report Status 09/04/2015 FINAL  Final   Organism ID, Bacteria ESCHERICHIA COLI (A)  Final   Organism ID, Bacteria ENTEROCOCCUS SPECIES (A)   Final      Susceptibility   Escherichia coli - MIC*    AMPICILLIN >=32 RESISTANT Resistant     CEFAZOLIN >=64 RESISTANT Resistant     CEFTRIAXONE <=1 SENSITIVE Sensitive     CIPROFLOXACIN <=0.25 SENSITIVE Sensitive     GENTAMICIN <=1 SENSITIVE Sensitive     IMIPENEM <=0.25 SENSITIVE Sensitive     NITROFURANTOIN <=16 SENSITIVE Sensitive     TRIMETH/SULFA <=20 SENSITIVE Sensitive     AMPICILLIN/SULBACTAM >=32 RESISTANT Resistant     PIP/TAZO <=4 SENSITIVE Sensitive     * >=100,000 COLONIES/mL ESCHERICHIA COLI   Enterococcus species - MIC*    AMPICILLIN <=2 SENSITIVE Sensitive     LEVOFLOXACIN 1 SENSITIVE Sensitive     NITROFURANTOIN <=16 SENSITIVE Sensitive     VANCOMYCIN 1 SENSITIVE Sensitive     * 80,000 COLONIES/mL ENTEROCOCCUS SPECIES     Labs: Basic Metabolic Panel:  Recent Labs Lab 08/31/15 2013 09/02/15 0455 09/03/15 0514 09/04/15 0513 09/05/15 0539  NA 136 143 144 139 142  K 4.1 4.3 4.6 4.2 4.0  CL 104 112* 111 106 108  CO2 '23 24 24 22 26  '$ GLUCOSE 126* 100* 109* 84 106*  BUN 30* 24* '18 15 14  '$ CREATININE 1.39* 1.05 0.84 0.84 0.83  CALCIUM 9.3 9.1 10.1 9.3 9.8  MG  --  1.9  --   --   --    Liver Function Tests:  Recent Labs Lab 08/30/15 0450 08/31/15 2013  AST 20 33  ALT 14* 7*  ALKPHOS 54 61  BILITOT 0.9 0.6  PROT 6.0* 6.1*  ALBUMIN 3.3* 3.2*   No results for input(s): LIPASE, AMYLASE in the last 168 hours.  Recent Labs Lab 08/29/15 2041 08/31/15 0459 09/03/15 1146  AMMONIA 137* 26 26   CBC:  Recent Labs Lab 08/30/15 0450 08/31/15 2013 09/02/15 0455 09/03/15 0514 09/04/15 0513 09/05/15 0539  WBC 8.5 16.0* 10.5 7.5 6.0 5.2  NEUTROABS 5.5 14.2*  --   --   --   --   HGB 11.6* 11.4* 10.1* 10.8* 10.6* 10.6*  HCT 35.3* 34.2* 30.6* 33.1* 32.4* 31.9*  MCV 91.2 91.0 90.0 92.7 91.5 89.9  PLT 251 215 171 175 199 230   Cardiac Enzymes: No results for input(s): CKTOTAL, CKMB, CKMBINDEX, TROPONINI in the last 168 hours. BNP: BNP (last 3  results) No results for input(s): BNP in the last 8760 hours.  ProBNP (last 3 results) No results for input(s): PROBNP in the last 8760 hours.  CBG:  Recent Labs Lab 09/04/15 0700 09/04/15 0800 09/04/15 1153 09/04/15 1849 09/05/15 0801  GLUCAP 65 125* 107* 134* 105*       Signed:  Sholanda Croson MD.  [facility] 09/05/2015, 11:19 AM

## 2015-09-05 NOTE — Progress Notes (Signed)
[facility], [phone], she instructed me she would call me back

## 2015-09-06 ENCOUNTER — Encounter: Payer: Self-pay | Admitting: Internal Medicine

## 2015-09-06 ENCOUNTER — Non-Acute Institutional Stay (SKILLED_NURSING_FACILITY): Payer: Medicare Other | Admitting: Internal Medicine

## 2015-09-06 DIAGNOSIS — B9620 Unspecified Escherichia coli [E. coli] as the cause of diseases classified elsewhere: Secondary | ICD-10-CM | POA: Diagnosis not present

## 2015-09-06 DIAGNOSIS — G20 Parkinson's disease: Secondary | ICD-10-CM

## 2015-09-06 DIAGNOSIS — R1310 Dysphagia, unspecified: Secondary | ICD-10-CM | POA: Diagnosis not present

## 2015-09-06 DIAGNOSIS — N390 Urinary tract infection, site not specified: Secondary | ICD-10-CM

## 2015-09-06 DIAGNOSIS — F0390 Unspecified dementia without behavioral disturbance: Secondary | ICD-10-CM | POA: Diagnosis not present

## 2015-09-06 DIAGNOSIS — R627 Adult failure to thrive: Secondary | ICD-10-CM | POA: Diagnosis not present

## 2015-09-06 DIAGNOSIS — D638 Anemia in other chronic diseases classified elsewhere: Secondary | ICD-10-CM | POA: Diagnosis not present

## 2015-09-06 DIAGNOSIS — R32 Unspecified urinary incontinence: Secondary | ICD-10-CM | POA: Diagnosis not present

## 2015-09-06 DIAGNOSIS — R531 Weakness: Secondary | ICD-10-CM

## 2015-09-06 DIAGNOSIS — J690 Pneumonitis due to inhalation of food and vomit: Secondary | ICD-10-CM | POA: Diagnosis not present

## 2015-09-06 DIAGNOSIS — G9341 Metabolic encephalopathy: Secondary | ICD-10-CM | POA: Diagnosis not present

## 2015-09-06 NOTE — Care Management Note (Signed)
Case Management Note  Patient Details  Name: Michael [name] MRN: [id number] Date of Birth: [birth year]                 Action/Plan: Notified [facility] pt was dc to SNF. [facility]   Expected Discharge Date:  09/05/2015               Expected Discharge Plan:  Skilled Nursing Facility  In-House Referral:  Clinical Social Work  Discharge planning Services  CM Consult  Post Acute Care Choice:  NA Choice offered to:  NA  DME Arranged:  N/A DME Agency:  NA  HH Arranged:  NA HH Agency:  NA  Status of Service:  Completed, signed off  Medicare Important Message Given:  Yes Date Medicare IM Given:    Medicare IM give by:    Date Additional Medicare IM Given:    Additional Medicare Important Message give by:     If discussed at Long Length of Stay Meetings, dates discussed:    Additional Comments:  Elliot CousinShavis, Aadon Gorelik Ellen, RN 09/06/2015, 9:58 AM

## 2015-09-06 NOTE — Progress Notes (Signed)
LOCATION:  [name]  PCP: Lupe Carney, MD   Code Status: DNR  Goals of care: Advanced Directive information Advanced Directives 09/06/2015  Does patient have an advance directive? Yes  Type of Advance Directive Out of facility DNR (pink MOST or yellow form)  Does patient want to make changes to advanced directive? No - Patient declined  Copy of advanced directive(s) in chart? Yes  Would patient like information on creating an advanced directive? -       Extended Emergency Contact Information Primary Emergency Contact: Holleman,[name] Address: [phone] [facility] [address]          [place] [address] [place] Home Phone: [phone] Relation: None Secondary Emergency Contact: [facility] Address: [address]          [place], [place] [address] [name] Home Phone: [phone] Relation: Son   No Known Allergies  Chief Complaint  Patient presents with  . New Admit To SNF    New Admission     HPI:  Patient is a 82 y.o. male seen today for short term rehabilitation post hospital admission from 08/29/15-09/05/15 with metabolic encephalopathy from e.coli and enterococcus UTI and aspiration pneumonia. He was started on iv antibiotics and then switched to po antibiotics. He was seen by SLP team for dysphagia. He had RUE iv site infiltration with arm swelling. He has parkinson's disease, b12 def, dementia among others. He is seen in his room with his wife present. He is responsive to painful stimuli but is otherwise fast asleep. Per wife he was awake this morning and worked with SLP team.   Review of Systems: Unable to obtain. Patient was fast asleep. Obtained from patients wife Constitutional: Negative for fever  HENT: Negative for headache. Positive with dysphagia Respiratory: positive for cough. Negative for shortness of breath and wheezing.   Cardiovascular: Negative for chest pain, palpitations, leg swelling.  Gastrointestinal: Negative for  heartburn, nausea, vomiting, abdominal pain. Musculoskeletal: Negative for back pain, fall.  Neurological: positive for weakness    Past Medical History  Diagnosis Date  . 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 Unspecified transient cerebral ischemia   . Hallucinations   . Other vitamin B12 deficiency anemia   . Paralysis agitans (HCC)   . Parkinson's disease (HCC) 03/27/2013   Past Surgical History  Procedure Laterality Date  . Cholecystectomy    . Transurethral resection of prostate    . Appendectomy    . Ptca  1987   Social History:   reports that he has never smoked. He does not have any smokeless tobacco history on file. He reports that he does not drink alcohol or use illicit drugs.  Family History  Problem Relation Age of Onset  . Parkinson's disease Sister   . Cancer Brother   . Parkinson's disease Daughter 49    Medications:   Medication List       This list is accurate as of: 09/06/15  3:32 PM.  Always use your most recent med list.               aspirin 325 MG tablet  Take 325 mg by mouth daily.     carbidopa-levodopa 25-100 MG tablet  Commonly known as:  SINEMET IR  Take 1.5 tablets by mouth 3 (three) times daily.     cefpodoxime 200 MG tablet  Commonly known as:  VANTIN  Take 1 tablet (200 mg total) by mouth every 12 (twelve) hours.  TAKE FOR 3 DAYS THEN STOP.     donepezil 10 MG tablet  Commonly known as:  ARICEPT  Take 1 tablet by mouth at  bedtime     fish oil-omega-3 fatty acids 1000 MG capsule  Take 1,200 mg by mouth daily.     guaiFENesin 600 MG 12 hr tablet  Commonly known as:  MUCINEX  Take 2 tablets (1,200 mg total) by mouth 2 (two) times daily. TAKE FOR 3 DAYS THEN STOP.     multivitamin-lutein Caps capsule  Take 1 capsule by mouth daily.     nitrofurantoin (macrocrystal-monohydrate) 100 MG capsule  Commonly known as:  MACROBID  Take 1 capsule (100 mg total) by mouth every 12 (twelve)  hours. TAKE FOR 6 DAYS THEN STOP.     oxybutynin 5 MG tablet  Commonly known as:  DITROPAN  Take 1 tablet by mouth  nightly     RESOURCE THICKENUP CLEAR Powd  Take 1 g by mouth as needed.     saccharomyces boulardii 250 MG capsule  Commonly known as:  FLORASTOR  Take 250 mg by mouth 2 (two) times daily. Stop date 09/18/15     TYLENOL 500 MG tablet  Generic drug:  acetaminophen  Take 500 mg by mouth every 6 (six) hours as needed for moderate pain.     Vitamin D-3 1000 units Caps  Take 1 capsule by mouth daily.        Immunizations:  There is no immunization history on file for this patient.   Physical Exam: Filed Vitals:   09/06/15 1516  BP: 176/70  Pulse: 62  Temp: 98.4 F (36.9 C)  TempSrc: Oral  Resp: 18  Height: 5\' 2"  (1.575 m)  Weight: 164 lb 6.4 oz (74.571 kg)  SpO2: 93%   Body mass index is 30.06 kg/(m^2).  General- elderly male, obese, frail, in no acute distress Head- normocephalic, atraumatic Nose- no maxillary or frontal sinus tenderness, no nasal discharge Throat- moist mucus membrane Eyes-  no pallor, no icterus, no discharge, normal conjunctiva, normal sclera Neck- no cervical lymphadenopathy Cardiovascular- normal s1,s2, no murmur Respiratory- poor air movement, no wheeze, no rhonchi, no crackles, no use of accessory muscles, on o2 by nasal canula Abdomen- bowel sounds present, soft, non tender Musculoskeletal- able to move all 4 extremities, generalized weakness, right UE swelling+, trace leg edema Neurological- fast asleep, responds to painful stimuli Skin- warm and dry Psychiatry- unable to assess   Labs reviewed: Basic Metabolic Panel:  Recent Labs  16/02/9603/20/17 0455 09/03/15 0514 09/04/15 0513 09/05/15 09/05/15 0539  NA 143 144 139 142 142  K 4.3 4.6 4.2  --  4.0  CL 112* 111 106  --  108  CO2 24 24 22   --  26  GLUCOSE 100* 109* 84  --  106*  BUN 24* 18 15 14 14   CREATININE 1.05 0.84 0.84 0.8 0.83  CALCIUM 9.1 10.1 9.3  --  9.8    MG 1.9  --   --   --   --    Liver Function Tests:  Recent Labs  08/30/15 0450 08/31/15 2013  AST 20 33  ALT 14* 7*  ALKPHOS 54 61  BILITOT 0.9 0.6  PROT 6.0* 6.1*  ALBUMIN 3.3* 3.2*   No results for input(s): LIPASE, AMYLASE in the last 8760 hours.  Recent Labs  08/29/15 2041 08/31/15 0459 09/03/15 1146  AMMONIA 137* 26 26   CBC:  Recent Labs  08/30/15 0450 08/31/15 2013  09/03/15 [id number]  09/04/15 0513 09/05/15 09/05/15 0539  WBC 8.5 16.0*  < > 7.5 6.0 5.2 5.2  NEUTROABS 5.5 14.2*  --   --   --   --   --   HGB 11.6* 11.4*  < > 10.8* 10.6*  --  10.6*  HCT 35.3* 34.2*  < > 33.1* 32.4*  --  31.9*  MCV 91.2 91.0  < > 92.7 91.5  --  89.9  PLT 251 215  < > 175 199  --  230  < > = values in this interval not displayed. Cardiac Enzymes: No results for input(s): CKTOTAL, CKMB, CKMBINDEX, TROPONINI in the last 8760 hours. BNP: Invalid input(s): POCBNP CBG:  Recent Labs  09/04/15 1153 09/04/15 1849 09/05/15 0801  GLUCAP 107* 134* 105*    Radiological Exams: Dg Pelvis 1-2 Views  08/29/2015  CLINICAL DATA:  Fall, unwitnessed. EXAM: PELVIS - 1-2 VIEW COMPARISON:  None. FINDINGS: No fracture or diastasis in the pelvis. No evidence of hip dislocation on this frontal view. Degenerative changes in the visualized lower lumbar spine. Diffuse osteopenia. No focal osseous lesions. IMPRESSION: No fracture or diastasis in the pelvis.  Diffuse osteopenia. Electronically Signed   By: Delbert Phenix M.D.   On: 08/29/2015 18:02   Ct Head Wo Contrast  09/04/2015  CLINICAL DATA:  82 year old male with Parkinson's disease and dementia status post fall. EXAM: CT HEAD WITHOUT CONTRAST TECHNIQUE: Contiguous axial images were obtained from the base of the skull through the vertex without intravenous contrast. COMPARISON:  Recent prior CT scan of the head 08/29/2015 FINDINGS: Negative for acute intracranial hemorrhage, acute infarction, mass, mass effect, hydrocephalus or midline shift.  Gray-white differentiation is preserved throughout. Stable advanced atrophy. Mild ex vacuo dilatation of the lateral ventricles. No acute soft tissue or calvarial abnormality. Normal aeration of the mastoid air cells and visualized paranasal sinus. Advanced atherosclerotic calcification of the bilateral cavernous and supraclinoid carotid arteries. IMPRESSION: 1. No acute intracranial process. 2. Stable advanced atrophy with and mild chronic microvascular ischemic white matter disease. Electronically Signed   By: Malachy Moan M.D.   On: 09/04/2015 10:37   Ct Head Wo Contrast  08/29/2015  CLINICAL DATA:  Patient status post fall. History of Parkinson's. Confusion. No reported loss consciousness. EXAM: CT HEAD WITHOUT CONTRAST CT CERVICAL SPINE WITHOUT CONTRAST TECHNIQUE: Multidetector CT imaging of the head and cervical spine was performed following the standard protocol without intravenous contrast. Multiplanar CT image reconstructions of the cervical spine were also generated. COMPARISON:  MRI brain 07/08/2009 FINDINGS: CT HEAD FINDINGS Ventricles and sulci are appropriate for patient's age. Periventricular and subcortical white matter hypodensity compatible with chronic microvascular ischemic changes. Orbits are unremarkable. Scalp soft tissues are unremarkable. Paranasal sinuses are well aerated. Mastoid air cells are unremarkable. Calvarium is intact. CT CERVICAL SPINE FINDINGS Normal anatomic alignment. Osseous fusion of the C2-3 and C3-4 levels. Additionally there is grade 1 anterolisthesis of C6-7 likely secondary to facet degenerative changes and fusion at this level. There is right-sided facet fusion at the C6-7 and C7-T1 levels. The craniocervical junction is intact. Relative preservation of the vertebral body heights. IMPRESSION: No acute intracranial process. No acute cervical spine fracture. Chronic microvascular ischemic changes. Multilevel cervical spinal fusion and associated degenerative  changes. Electronically Signed   By: Annia Belt M.D.   On: 08/29/2015 18:14   Ct Cervical Spine Wo Contrast  08/29/2015  CLINICAL DATA:  Patient status post fall. History of Parkinson's. Confusion. No reported loss consciousness. EXAM: CT HEAD WITHOUT CONTRAST  CT CERVICAL SPINE WITHOUT CONTRAST TECHNIQUE: Multidetector CT imaging of the head and cervical spine was performed following the standard protocol without intravenous contrast. Multiplanar CT image reconstructions of the cervical spine were also generated. COMPARISON:  MRI brain 07/08/2009 FINDINGS: CT HEAD FINDINGS Ventricles and sulci are appropriate for patient's age. Periventricular and subcortical white matter hypodensity compatible with chronic microvascular ischemic changes. Orbits are unremarkable. Scalp soft tissues are unremarkable. Paranasal sinuses are well aerated. Mastoid air cells are unremarkable. Calvarium is intact. CT CERVICAL SPINE FINDINGS Normal anatomic alignment. Osseous fusion of the C2-3 and C3-4 levels. Additionally there is grade 1 anterolisthesis of C6-7 likely secondary to facet degenerative changes and fusion at this level. There is right-sided facet fusion at the C6-7 and C7-T1 levels. The craniocervical junction is intact. Relative preservation of the vertebral body heights. IMPRESSION: No acute intracranial process. No acute cervical spine fracture. Chronic microvascular ischemic changes. Multilevel cervical spinal fusion and associated degenerative changes. Electronically Signed   By: Annia Belt M.D.   On: 08/29/2015 18:14   Dg Chest Port 1 View  08/31/2015  CLINICAL DATA:  Fever. EXAM: PORTABLE CHEST 1 VIEW COMPARISON:  August 29, 2015. FINDINGS: Stable cardiomediastinal silhouette. No pneumothorax is noted. Right lung is clear. Increased left basilar opacity is noted consistent with worsening atelectasis or possibly pneumonia. Minimal left pleural effusion is noted. Old right rib fractures are noted. Atherosclerosis  thoracic aorta is noted. IMPRESSION: Increased left basilar opacity is noted concerning for worsening atelectasis or pneumonia. Possible minimal left pleural effusion is noted as well. Electronically Signed   By: Lupita Raider, M.D.   On: 08/31/2015 20:13   Portable Chest 1 View  08/29/2015  CLINICAL DATA:  Patient with multiple unwitnessed falls. Right-sided rib pain. Initial encounter. EXAM: PORTABLE CHEST 1 VIEW COMPARISON:  Chest radiograph 07/08/2009 FINDINGS: Monitoring leads overlie the patient. Low lung volumes. Stable cardiac and mediastinal contours. Minimal heterogeneous opacities left lung base. No pleural effusion or pneumothorax. Age-indeterminate but likely chronic right fourth and fifth rib fractures. IMPRESSION: Age-indeterminate but likely chronic right fourth and fifth rib fractures. Low lung volumes with basilar opacities favored to represent atelectasis. Electronically Signed   By: Annia Belt M.D.   On: 08/29/2015 20:57   Dg Swallowing Func-speech Pathology  09/01/2015  Objective Swallowing Evaluation: Type of Study: MBS-Modified Barium Swallow Study Patient Details Name: NIHAR KLUS MRN: [id number] Date of Birth: [birth year] Today's Date: 09/01/2015 Time: SLP Start Time (ACUTE ONLY): 1310-SLP Stop Time (ACUTE ONLY): 1340 SLP Time Calculation (min) (ACUTE ONLY): 30 min Past Medical History: Past Medical History Diagnosis Date . 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 Unspecified transient cerebral ischemia  . Hallucinations  . Other vitamin B12 deficiency anemia  . Paralysis agitans (HCC)  . Parkinson's disease (HCC) 03/27/2013 Past Surgical History: Past Surgical History Procedure Laterality Date . Cholecystectomy   . Transurethral resection of prostate   . Appendectomy   . Ptca  1987 HPI: pt is an 82 yo male with h/p Parkinson's disease admitted to hospital with AMS, acute encephalopathy, lethargy.  RN reports pt's ammonia levels are off.  Pt has had  multiple recent falls- has rib fractures.  CXR 4/16 favors ATX,  but pt spiked temps overnight and CXR worsening.  CT head negative for acute change.  MBS indicated.  Subjective: pt awake in chair Assessment / Plan / Recommendation CHL IP CLINICAL IMPRESSIONS 09/01/2015 Therapy Diagnosis Moderate oral phase dysphagia;Moderate pharyngeal phase dysphagia;Mild cervical esophageal  phase dysphagia Clinical Impression Pt presents with moderate oropharyngeal dysphagia characterized by sensorimotor deficits due to his Parkinson's disease. Weakness and sensory deficits result in delayed oral transiting, lingual pumping and delayed pharyngeal swallow response. Mild oropharyngeal residuals present without pt awareness. Cued dry swallows were difficult for him to conduct but were effective when achieved. Pt did tracely SILENTLY aspirate thin mixed with secretions due to delayed pharyngeal swallow. Aspirates cleared with cued cough. Pt did have retained secretions in mouth and wet voice prior to starting MBS - suspect aspiration of secretions ongoing.Of note, pt also appeared with decreased CP compliance = allowing minimal backflow at proximal esophagus without pt awareness. This could also contribute to pt's aspiration risk. Recommendations Continue Dys3/nectar diet. Medicine with applesauce (crush if large and not contraindicated).Liquids via cup , follow solids with liquids, intermittent dry swallow, oral care after meals, cough if voice gurgly Follow up SLP at next venue of care for dysphagia management.  Impact on safety and function Moderate aspiration risk;Severe aspiration risk   CHL IP TREATMENT RECOMMENDATION 09/01/2015 Treatment Recommendations Therapy as outlined in treatment plan below   Prognosis 09/01/2015 Prognosis for Safe Diet Advancement Fair Barriers to Reach Goals Time post onset Barriers/Prognosis Comment -- CHL IP DIET RECOMMENDATION 09/01/2015 SLP Diet Recommendations Ice chips PRN after oral  care;Dysphagia 3 (Mech soft) solids;Nectar thick liquid Liquid Administration via -- Medication Administration Crushed with puree Compensations Minimize environmental distractions;Slow rate;Small sips/bites;Follow solids with liquid;Hard cough after swallow Postural Changes --   No flowsheet data found.  CHL IP FOLLOW UP RECOMMENDATIONS 09/01/2015 Follow up Recommendations Home health SLP   CHL IP FREQUENCY AND DURATION 09/01/2015 Speech Therapy Frequency (ACUTE ONLY) min 2x/week Treatment Duration 2 weeks      CHL IP ORAL PHASE 09/01/2015 Oral Phase Impaired Oral - Pudding Teaspoon -- Oral - Pudding Cup -- Oral - Honey Teaspoon -- Oral - Honey Cup -- Oral - Nectar Teaspoon Weak lingual manipulation;Lingual pumping;Piecemeal swallowing;Delayed oral transit Oral - Nectar Cup Weak lingual manipulation;Lingual pumping;Piecemeal swallowing;Delayed oral transit;Premature spillage Oral - Nectar Straw -- Oral - Thin Teaspoon Weak lingual manipulation;Lingual pumping;Reduced posterior propulsion;Piecemeal swallowing;Delayed oral transit;Decreased bolus cohesion Oral - Thin Cup Weak lingual manipulation;Reduced posterior propulsion;Delayed oral transit;Premature spillage Oral - Thin Straw Weak lingual manipulation;Lingual pumping;Reduced posterior propulsion;Lingual/palatal residue;Delayed oral transit Oral - Puree Weak lingual manipulation;Lingual pumping;Lingual/palatal residue;Piecemeal swallowing;Delayed oral transit Oral - Mech Soft -- Oral - Regular Impaired mastication;Weak lingual manipulation;Lingual pumping;Lingual/palatal residue;Piecemeal swallowing;Delayed oral transit Oral - Multi-Consistency -- Oral - Pill -- Oral Phase - Comment pt without awareness to mild oral residuals  CHL IP PHARYNGEAL PHASE 09/01/2015 Pharyngeal Phase Impaired Pharyngeal- Pudding Teaspoon -- Pharyngeal -- Pharyngeal- Pudding Cup -- Pharyngeal -- Pharyngeal- Honey Teaspoon -- Pharyngeal -- Pharyngeal- Honey Cup -- Pharyngeal -- Pharyngeal-  Nectar Teaspoon Delayed swallow initiation-pyriform sinuses Pharyngeal -- Pharyngeal- Nectar Cup Delayed swallow initiation-pyriform sinuses Pharyngeal -- Pharyngeal- Nectar Straw -- Pharyngeal -- Pharyngeal- Thin Teaspoon Delayed swallow initiation-pyriform sinuses Pharyngeal -- Pharyngeal- Thin Cup Delayed swallow initiation-pyriform sinuses Pharyngeal -- Pharyngeal- Thin Straw Delayed swallow initiation-pyriform sinuses Pharyngeal -- Pharyngeal- Puree Delayed swallow initiation-vallecula Pharyngeal -- Pharyngeal- Mechanical Soft -- Pharyngeal -- Pharyngeal- Regular Delayed swallow initiation-vallecula Pharyngeal -- Pharyngeal- Multi-consistency -- Pharyngeal -- Pharyngeal- Pill -- Pharyngeal -- Pharyngeal Comment --  CHL IP CERVICAL ESOPHAGEAL PHASE 09/01/2015 Cervical Esophageal Phase Impaired Pudding Teaspoon -- Pudding Cup -- Honey Teaspoon -- Honey Cup -- Nectar Teaspoon Reduced cricopharyngeal relaxation;Esophageal backflow into cervical esophagus Nectar Cup Reduced cricopharyngeal relaxation;Esophageal backflow into cervical esophagus  Nectar Straw -- Thin Teaspoon Reduced cricopharyngeal relaxation;Esophageal backflow into cervical esophagus Thin Cup Reduced cricopharyngeal relaxation;Esophageal backflow into cervical esophagus Thin Straw Reduced cricopharyngeal relaxation;Esophageal backflow into cervical esophagus Puree -- Mechanical Soft -- Regular -- Multi-consistency -- Pill -- Cervical Esophageal Comment pt without senstion to appearance of mild backflow [name], MS [facility] SLP [phone]               Assessment/Plan  Generalized weakness With physical deconditioning. Will have him work with physical therapy and occupational therapy team to help with gait training and muscle strengthening exercises.fall precautions. Skin care. Encourage to be out of bed. Get palliative care consult given his failure to thrive with dementia and deconditioning  Failure to thrive With his deconditioning, get  palliative care consult as above  Metabolic encephalopathy With infection and deconditioning. Continue supportive care. Continue and complete his antibiotic course.   Aspiration pneumonia Continue and complete course of vantin 200 mg q12h on 09/08/15. Encourage to use incentive spirometer. Use o2 and wean off as tolerated. Continue mucinex for cough  uti With ecoli and enterococcus. Maintain hydration. Continue and complete course of macrobid on 4.29/17  Dysphagia To work with SLP team and continue food thickner, aspiration precautions and assistance with feed. Continue mechanical soft diet.  Parkinson's disease To continue with sinemet 25-100 mg 1.5 tab tid and monitor  Dementia Continue aricept with supportive care and monitor labs. Fall precautions  UI Continue ditropan nightly and monitor  Anemia of chronic disease Monitor cbc    Goals of care: short term rehabilitation   Labs/tests ordered: cbc, cmp, palliative care consult  Family/ staff Communication: reviewed care plan with patient's wife and nursing supervisor    Oneal Grout, MD Internal Medicine [facility] [address] [place], [place] [address] Cell Phone (Monday-Friday 8 am - 5 pm): [phone] On Call: [phone] and follow prompts after 5 pm and on weekends Office Phone: [phone] Office Fax: [phone]

## 2015-09-08 LAB — CBC AND DIFFERENTIAL
HCT: 38 % — AB (ref 41–53)
Hemoglobin: 11.9 g/dL — AB (ref 13.5–17.5)
Platelets: 375 10*3/uL (ref 150–399)
WBC: 7.3 10^3/mL

## 2015-09-08 LAB — BASIC METABOLIC PANEL
BUN: 14 mg/dL (ref 4–21)
Creatinine: 0.9 mg/dL (ref 0.6–1.3)
Glucose: 92 mg/dL
Potassium: 4.6 mmol/L (ref 3.4–5.3)
SODIUM: 141 mmol/L (ref 137–147)

## 2015-09-08 LAB — HEPATIC FUNCTION PANEL
ALK PHOS: 69 U/L (ref 25–125)
ALT: 27 U/L (ref 10–40)
AST: 38 U/L (ref 14–40)
BILIRUBIN, TOTAL: 0.4 mg/dL

## 2015-09-09 ENCOUNTER — Encounter: Payer: Self-pay | Admitting: Family

## 2015-09-09 ENCOUNTER — Non-Acute Institutional Stay (SKILLED_NURSING_FACILITY): Payer: Medicare Other | Admitting: Family

## 2015-09-09 DIAGNOSIS — G20 Parkinson's disease: Secondary | ICD-10-CM

## 2015-09-09 DIAGNOSIS — F0390 Unspecified dementia without behavioral disturbance: Secondary | ICD-10-CM | POA: Diagnosis not present

## 2015-09-09 DIAGNOSIS — E46 Unspecified protein-calorie malnutrition: Secondary | ICD-10-CM

## 2015-09-09 NOTE — Progress Notes (Signed)
Location:  [facility] Room Number: 102 Place of Service:  SNF [phone]) Provider: Richarda Blade, FNP-C  Lupe Carney, MD  Patient Care Team: L.Lupe Carney, MD as PCP - General (Family Medicine)  Extended Emergency Contact Information Primary Emergency Contact: Givan,[name] Address: [id number]          [place] [address] [name] Home Phone: [phone] Relation: None Secondary Emergency Contact: [facility] Address: [address]          [place], [place] [address] [name] of [place] Home Phone: [phone] Relation: Son  Code Status:  DNR  Goals of care: Advanced Directive information Advanced Directives 09/09/2015  Does patient have an advance directive? Yes  Type of Advance Directive Out of facility DNR (pink MOST or yellow form)  Does patient want to make changes to advanced directive? No - Patient declined  Copy of advanced directive(s) in chart? Yes     Chief Complaint  Patient presents with  . Acute Visit    Follow up on Abnormal Labs    HPI:  Pt is a 82 y.o. male seen today for an acute visit for evaluation of abnormal lab results. He has a significant medical history of Parkinson's disease, Dementia among others. He is seen in his room today. Recent lab results showed TP 5.6, Alb 3.24 ( 09/08/2015). Patient's wife states his appetite has improved since hospital discharge though not great. Facility staff reports no new concerns.     Past Medical History  Diagnosis Date  . 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 Unspecified transient cerebral ischemia   . Hallucinations   . Other vitamin B12 deficiency anemia   . Paralysis agitans (HCC)   . Parkinson's disease (HCC) 03/27/2013   Past Surgical History  Procedure Laterality Date  . Cholecystectomy    . Transurethral resection of prostate    . Appendectomy    . Ptca  1987    No Known Allergies      Medication List       This list is accurate as of: 09/09/15  2:42 PM.  Always use your most recent med list.               aspirin 325 MG tablet  Take 325 mg by mouth daily.     carbidopa-levodopa 25-100 MG tablet  Commonly known as:  SINEMET IR  1.5 tablets by mouth three times daily. Dosing times : 1 hour before meals or 2 hours after meals.     donepezil 10 MG tablet  Commonly known as:  ARICEPT  Take 1 tablet by mouth at  bedtime     fish oil-omega-3 fatty acids 1000 MG capsule  Take 1,200 mg by mouth daily.     Maltodextrin-Xanthan Gum Powd  Take 1 g by mouth daily as needed. High Risk of Aspiration     multivitamin-lutein Caps capsule  Take 1 capsule by mouth daily.     nitrofurantoin (macrocrystal-monohydrate) 100 MG capsule  Commonly known as:  MACROBID  Take 1 capsule (100 mg total) by mouth every 12 (twelve) hours. TAKE FOR 6 DAYS THEN STOP.     oxybutynin 5 MG tablet  Commonly known as:  DITROPAN  Take 1 tablet by mouth  nightly     OXYGEN  Inhale 3 L into the lungs. 3 Lpm     saccharomyces boulardii 250 MG capsule  Commonly known as:  FLORASTOR  Take 250 mg  by mouth 2 (two) times daily. Stop date 09/18/15     TYLENOL 500 MG tablet  Generic drug:  acetaminophen  Take 500 mg by mouth every 6 (six) hours as needed for moderate pain.     Vitamin D-3 1000 units Caps  Take 1 capsule by mouth daily.        Review of Systems  Unable to perform ROS: Dementia    Immunization History  Administered Date(s) Administered  . PPD Test 09/05/2015   Pertinent  Health Maintenance Due  Topic Date Due  . PNA vac Low Risk Adult (1 of 2 - PCV13) 08/28/1994  . INFLUENZA VACCINE  12/14/2015   No flowsheet data found. Functional Status Survey:    Filed Vitals:   09/09/15 1354  BP: 128/67  Pulse: 68  Temp: 97.6 F (36.4 C)  Resp: 20  Height: 5\' 2"  (1.575 m)  Weight: 164 lb 9.6 oz (74.662 kg)  SpO2: 97%   Body mass index is 30.1 kg/(m^2). Physical  Exam  Constitutional: He appears well-developed and well-nourished. No distress.  Elderly confused at baseline.   HENT:  Head: Normocephalic.  Mouth/Throat: Oropharynx is clear and moist. No oropharyngeal exudate.  Eyes: Conjunctivae and EOM are normal. Pupils are equal, round, and reactive to light. Right eye exhibits no discharge. Left eye exhibits no discharge. No scleral icterus.  Neck: Neck supple. No JVD present.  Cardiovascular: Normal rate, regular rhythm, normal heart sounds and intact distal pulses.  Exam reveals no gallop and no friction rub.   No murmur heard. Pulmonary/Chest: Effort normal and breath sounds normal. No respiratory distress. He has no wheezes. He has no rales.  Abdominal: Soft. Bowel sounds are normal. He exhibits no distension. There is no tenderness. There is no rebound and no guarding.  Musculoskeletal: He exhibits no edema or tenderness.  Moves x 4 extremities.   Lymphadenopathy:    He has no cervical adenopathy.  Neurological: He is alert.  Confused at baseline   Skin: Skin is warm and dry. No rash noted. No erythema. No pallor.  Psychiatric: He has a normal mood and affect.    Labs reviewed:  Recent Labs  09/02/15 0455 09/03/15 0514 09/04/15 0513 09/05/15 09/05/15 0539 09/08/15  NA 143 144 139 142 142 141  K 4.3 4.6 4.2  --  4.0 4.6  CL 112* 111 106  --  108  --   CO2 24 24 22   --  26  --   GLUCOSE 100* 109* 84  --  106*  --   BUN 24* 18 15 14 14 14   CREATININE 1.05 0.84 0.84 0.8 0.83 0.9  CALCIUM 9.1 10.1 9.3  --  9.8  --   MG 1.9  --   --   --   --   --     Recent Labs  08/30/15 0450 08/31/15 2013 09/08/15  AST 20 33 38  ALT 14* 7* 27  ALKPHOS 54 61 69  BILITOT 0.9 0.6  --   PROT 6.0* 6.1*  --   ALBUMIN 3.3* 3.2*  --     Recent Labs  08/30/15 0450 08/31/15 2013  09/03/15 0514 09/04/15 0513 09/05/15 09/05/15 0539 09/08/15  WBC 8.5 16.0*  < > 7.5 6.0 5.2 5.2 7.3  NEUTROABS 5.5 14.2*  --   --   --   --   --   --   HGB  11.6* 11.4*  < > 10.8* 10.6*  --  10.6* 11.9*  HCT 35.3*  34.2*  < > 33.1* 32.4*  --  31.9* 38*  MCV 91.2 91.0  < > 92.7 91.5  --  89.9  --   PLT 251 215  < > 175 199  --  230 375  < > = values in this interval not displayed. Lab Results  Component Value Date   TSH 2.336 08/30/2015   No results found for: HGBA1C No results found for: CHOL, HDL, LDLCALC, LDLDIRECT, TRIG, CHOLHDL  Significant Diagnostic Results in last 30 days:  Dg Pelvis 1-2 Views  08/29/2015  CLINICAL DATA:  Fall, unwitnessed. EXAM: PELVIS - 1-2 VIEW COMPARISON:  None. FINDINGS: No fracture or diastasis in the pelvis. No evidence of hip dislocation on this frontal view. Degenerative changes in the visualized lower lumbar spine. Diffuse osteopenia. No focal osseous lesions. IMPRESSION: No fracture or diastasis in the pelvis.  Diffuse osteopenia. Electronically Signed   By: Delbert PhenixJason A Poff M.D.   On: 08/29/2015 18:02   Ct Head Wo Contrast  09/04/2015  CLINICAL DATA:  82 year old male with Parkinson's disease and dementia status post fall. EXAM: CT HEAD WITHOUT CONTRAST TECHNIQUE: Contiguous axial images were obtained from the base of the skull through the vertex without intravenous contrast. COMPARISON:  Recent prior CT scan of the head 08/29/2015 FINDINGS: Negative for acute intracranial hemorrhage, acute infarction, mass, mass effect, hydrocephalus or midline shift. Gray-white differentiation is preserved throughout. Stable advanced atrophy. Mild ex vacuo dilatation of the lateral ventricles. No acute soft tissue or calvarial abnormality. Normal aeration of the mastoid air cells and visualized paranasal sinus. Advanced atherosclerotic calcification of the bilateral cavernous and supraclinoid carotid arteries. IMPRESSION: 1. No acute intracranial process. 2. Stable advanced atrophy with and mild chronic microvascular ischemic white matter disease. Electronically Signed   By: Malachy MoanHeath  McCullough M.D.   On: 09/04/2015 10:37   Ct Head Wo  Contrast  08/29/2015  CLINICAL DATA:  Patient status post fall. History of Parkinson's. Confusion. No reported loss consciousness. EXAM: CT HEAD WITHOUT CONTRAST CT CERVICAL SPINE WITHOUT CONTRAST TECHNIQUE: Multidetector CT imaging of the head and cervical spine was performed following the standard protocol without intravenous contrast. Multiplanar CT image reconstructions of the cervical spine were also generated. COMPARISON:  MRI brain 07/08/2009 FINDINGS: CT HEAD FINDINGS Ventricles and sulci are appropriate for patient's age. Periventricular and subcortical white matter hypodensity compatible with chronic microvascular ischemic changes. Orbits are unremarkable. Scalp soft tissues are unremarkable. Paranasal sinuses are well aerated. Mastoid air cells are unremarkable. Calvarium is intact. CT CERVICAL SPINE FINDINGS Normal anatomic alignment. Osseous fusion of the C2-3 and C3-4 levels. Additionally there is grade 1 anterolisthesis of C6-7 likely secondary to facet degenerative changes and fusion at this level. There is right-sided facet fusion at the C6-7 and C7-T1 levels. The craniocervical junction is intact. Relative preservation of the vertebral body heights. IMPRESSION: No acute intracranial process. No acute cervical spine fracture. Chronic microvascular ischemic changes. Multilevel cervical spinal fusion and associated degenerative changes. Electronically Signed   By: Annia Beltrew  Davis M.D.   On: 08/29/2015 18:14   Ct Cervical Spine Wo Contrast  08/29/2015  CLINICAL DATA:  Patient status post fall. History of Parkinson's. Confusion. No reported loss consciousness. EXAM: CT HEAD WITHOUT CONTRAST CT CERVICAL SPINE WITHOUT CONTRAST TECHNIQUE: Multidetector CT imaging of the head and cervical spine was performed following the standard protocol without intravenous contrast. Multiplanar CT image reconstructions of the cervical spine were also generated. COMPARISON:  MRI brain 07/08/2009 FINDINGS: CT HEAD FINDINGS  Ventricles and sulci are appropriate  for patient's age. Periventricular and subcortical white matter hypodensity compatible with chronic microvascular ischemic changes. Orbits are unremarkable. Scalp soft tissues are unremarkable. Paranasal sinuses are well aerated. Mastoid air cells are unremarkable. Calvarium is intact. CT CERVICAL SPINE FINDINGS Normal anatomic alignment. Osseous fusion of the C2-3 and C3-4 levels. Additionally there is grade 1 anterolisthesis of C6-7 likely secondary to facet degenerative changes and fusion at this level. There is right-sided facet fusion at the C6-7 and C7-T1 levels. The craniocervical junction is intact. Relative preservation of the vertebral body heights. IMPRESSION: No acute intracranial process. No acute cervical spine fracture. Chronic microvascular ischemic changes. Multilevel cervical spinal fusion and associated degenerative changes. Electronically Signed   By: Annia Belt M.D.   On: 08/29/2015 18:14   Dg Chest Port 1 View  08/31/2015  CLINICAL DATA:  Fever. EXAM: PORTABLE CHEST 1 VIEW COMPARISON:  August 29, 2015. FINDINGS: Stable cardiomediastinal silhouette. No pneumothorax is noted. Right lung is clear. Increased left basilar opacity is noted consistent with worsening atelectasis or possibly pneumonia. Minimal left pleural effusion is noted. Old right rib fractures are noted. Atherosclerosis thoracic aorta is noted. IMPRESSION: Increased left basilar opacity is noted concerning for worsening atelectasis or pneumonia. Possible minimal left pleural effusion is noted as well. Electronically Signed   By: Lupita Raider, M.D.   On: 08/31/2015 20:13   Portable Chest 1 View  08/29/2015  CLINICAL DATA:  Patient with multiple unwitnessed falls. Right-sided rib pain. Initial encounter. EXAM: PORTABLE CHEST 1 VIEW COMPARISON:  Chest radiograph 07/08/2009 FINDINGS: Monitoring leads overlie the patient. Low lung volumes. Stable cardiac and mediastinal contours. Minimal  heterogeneous opacities left lung base. No pleural effusion or pneumothorax. Age-indeterminate but likely chronic right fourth and fifth rib fractures. IMPRESSION: Age-indeterminate but likely chronic right fourth and fifth rib fractures. Low lung volumes with basilar opacities favored to represent atelectasis. Electronically Signed   By: Annia Belt M.D.   On: 08/29/2015 20:57   Dg Swallowing Func-speech Pathology  09/01/2015  Objective Swallowing Evaluation: Type of Study: MBS-Modified Barium Swallow Study Patient Details Name: Michael [name] MRN: [id number] Date of Birth: 25-Jun-1927 Today's Date: 09/01/2015 Time: SLP Start Time (ACUTE ONLY): 1310-SLP Stop Time (ACUTE ONLY): 1340 SLP Time Calculation (min) (ACUTE ONLY): 30 min Past Medical History: Past Medical History Diagnosis Date . 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 Unspecified transient cerebral ischemia  . Hallucinations  . Other vitamin B12 deficiency anemia  . Paralysis agitans (HCC)  . Parkinson's disease (HCC) 03/27/2013 Past Surgical History: Past Surgical History Procedure Laterality Date . Cholecystectomy   . Transurethral resection of prostate   . Appendectomy   . Ptca  1987 HPI: pt is an 82 yo male with h/p Parkinson's disease admitted to hospital with AMS, acute encephalopathy, lethargy.  RN reports pt's ammonia levels are off.  Pt has had multiple recent falls- has rib fractures.  CXR 4/16 favors ATX,  but pt spiked temps overnight and CXR worsening.  CT head negative for acute change.  MBS indicated.  Subjective: pt awake in chair Assessment / Plan / Recommendation CHL IP CLINICAL IMPRESSIONS 09/01/2015 Therapy Diagnosis Moderate oral phase dysphagia;Moderate pharyngeal phase dysphagia;Mild cervical esophageal phase dysphagia Clinical Impression Pt presents with moderate oropharyngeal dysphagia characterized by sensorimotor deficits due to his Parkinson's disease. Weakness and sensory deficits result in  delayed oral transiting, lingual pumping and delayed pharyngeal swallow response. Mild oropharyngeal residuals present without pt awareness. Cued dry swallows were difficult for him  to conduct but were effective when achieved. Pt did tracely SILENTLY aspirate thin mixed with secretions due to delayed pharyngeal swallow. Aspirates cleared with cued cough. Pt did have retained secretions in mouth and wet voice prior to starting MBS - suspect aspiration of secretions ongoing.Of note, pt also appeared with decreased CP compliance = allowing minimal backflow at proximal esophagus without pt awareness. This could also contribute to pt's aspiration risk. Recommendations Continue Dys3/nectar diet. Medicine with applesauce (crush if large and not contraindicated).Liquids via cup , follow solids with liquids, intermittent dry swallow, oral care after meals, cough if voice gurgly Follow up SLP at next venue of care for dysphagia management.  Impact on safety and function Moderate aspiration risk;Severe aspiration risk   CHL IP TREATMENT RECOMMENDATION 09/01/2015 Treatment Recommendations Therapy as outlined in treatment plan below   Prognosis 09/01/2015 Prognosis for Safe Diet Advancement Fair Barriers to Reach Goals Time post onset Barriers/Prognosis Comment -- CHL IP DIET RECOMMENDATION 09/01/2015 SLP Diet Recommendations Ice chips PRN after oral care;Dysphagia 3 (Mech soft) solids;Nectar thick liquid Liquid Administration via -- Medication Administration Crushed with puree Compensations Minimize environmental distractions;Slow rate;Small sips/bites;Follow solids with liquid;Hard cough after swallow Postural Changes --   No flowsheet data found.  CHL IP FOLLOW UP RECOMMENDATIONS 09/01/2015 Follow up Recommendations Home health SLP   CHL IP FREQUENCY AND DURATION 09/01/2015 Speech Therapy Frequency (ACUTE ONLY) min 2x/week Treatment Duration 2 weeks      CHL IP ORAL PHASE 09/01/2015 Oral Phase Impaired Oral - Pudding  Teaspoon -- Oral - Pudding Cup -- Oral - Honey Teaspoon -- Oral - Honey Cup -- Oral - Nectar Teaspoon Weak lingual manipulation;Lingual pumping;Piecemeal swallowing;Delayed oral transit Oral - Nectar Cup Weak lingual manipulation;Lingual pumping;Piecemeal swallowing;Delayed oral transit;Premature spillage Oral - Nectar Straw -- Oral - Thin Teaspoon Weak lingual manipulation;Lingual pumping;Reduced posterior propulsion;Piecemeal swallowing;Delayed oral transit;Decreased bolus cohesion Oral - Thin Cup Weak lingual manipulation;Reduced posterior propulsion;Delayed oral transit;Premature spillage Oral - Thin Straw Weak lingual manipulation;Lingual pumping;Reduced posterior propulsion;Lingual/palatal residue;Delayed oral transit Oral - Puree Weak lingual manipulation;Lingual pumping;Lingual/palatal residue;Piecemeal swallowing;Delayed oral transit Oral - Mech Soft -- Oral - Regular Impaired mastication;Weak lingual manipulation;Lingual pumping;Lingual/palatal residue;Piecemeal swallowing;Delayed oral transit Oral - Multi-Consistency -- Oral - Pill -- Oral Phase - Comment pt without awareness to mild oral residuals  CHL IP PHARYNGEAL PHASE 09/01/2015 Pharyngeal Phase Impaired Pharyngeal- Pudding Teaspoon -- Pharyngeal -- Pharyngeal- Pudding Cup -- Pharyngeal -- Pharyngeal- Honey Teaspoon -- Pharyngeal -- Pharyngeal- Honey Cup -- Pharyngeal -- Pharyngeal- Nectar Teaspoon Delayed swallow initiation-pyriform sinuses Pharyngeal -- Pharyngeal- Nectar Cup Delayed swallow initiation-pyriform sinuses Pharyngeal -- Pharyngeal- Nectar Straw -- Pharyngeal -- Pharyngeal- Thin Teaspoon Delayed swallow initiation-pyriform sinuses Pharyngeal -- Pharyngeal- Thin Cup Delayed swallow initiation-pyriform sinuses Pharyngeal -- Pharyngeal- Thin Straw Delayed swallow initiation-pyriform sinuses Pharyngeal -- Pharyngeal- Puree Delayed swallow initiation-vallecula Pharyngeal -- Pharyngeal- Mechanical Soft -- Pharyngeal -- Pharyngeal- Regular  Delayed swallow initiation-vallecula Pharyngeal -- Pharyngeal- Multi-consistency -- Pharyngeal -- Pharyngeal- Pill -- Pharyngeal -- Pharyngeal Comment --  CHL IP CERVICAL ESOPHAGEAL PHASE 09/01/2015 Cervical Esophageal Phase Impaired Pudding Teaspoon -- Pudding Cup -- Honey Teaspoon -- Honey Cup -- Nectar Teaspoon Reduced cricopharyngeal relaxation;Esophageal backflow into cervical esophagus Nectar Cup Reduced cricopharyngeal relaxation;Esophageal backflow into cervical esophagus Nectar Straw -- Thin Teaspoon Reduced cricopharyngeal relaxation;Esophageal backflow into cervical esophagus Thin Cup Reduced cricopharyngeal relaxation;Esophageal backflow into cervical esophagus Thin Straw Reduced cricopharyngeal relaxation;Esophageal backflow into cervical esophagus Puree -- Mechanical Soft -- Regular -- Multi-consistency -- Pill -- Cervical Esophageal Comment pt without senstion to appearance  of mild backflow [name], [facility] [address] SLP [phone]               Assessment/Plan Protein Malnutrition  Recent lab results showed TP 5.6, Alb 3.24 (09/08/2015). Decreased oral intake due to cognitive impairment due to progressive parkinson's disease with dementia. Continue to assist with all meals. RD consult for protein supplements.Monitor BMP    Dementia  Progressive decline.Continue to assist with ADL's. Skin care to prevent skin breakdown.  Parkinson's disease  Continues to decline. Continue on sinemet 25-100 mg Tablet.    Family/ staff Communication:Reviewed plan of care with patient's wife and facility Nurse supervisor.  Labs/tests ordered:  None

## 2015-09-15 ENCOUNTER — Ambulatory Visit: Payer: Medicare Other | Admitting: Neurology

## 2015-09-21 ENCOUNTER — Encounter: Payer: Self-pay | Admitting: Family

## 2015-09-21 ENCOUNTER — Non-Acute Institutional Stay (SKILLED_NURSING_FACILITY): Payer: Medicare Other | Admitting: Family

## 2015-09-21 DIAGNOSIS — K219 Gastro-esophageal reflux disease without esophagitis: Secondary | ICD-10-CM

## 2015-09-21 DIAGNOSIS — R1310 Dysphagia, unspecified: Secondary | ICD-10-CM | POA: Diagnosis not present

## 2015-09-21 NOTE — Progress Notes (Signed)
Location:  [facility] Room Number: 102 Place of Service:  SNF [phone]) Provider:  Richarda Blade, NP Oneal Grout, MD   Lupe Carney, MD  Patient Care Team: L.Lupe Carney, MD as PCP - General (Family Medicine)  Extended Emergency Contact Information Primary Emergency Contact: Schuessler,[name] Address: [phone]          [place] [address] [name] Home Phone: [phone] Relation: None Secondary Emergency Contact: [facility] Address: [address]          [place], [place] [address] [name] of [place] Home Phone: [phone] Relation: Son  Code Status: DNR Goals of care: Advanced Directive information Advanced Directives 09/21/2015  Does patient have an advance directive? Yes  Type of Advance Directive Out of facility DNR (pink MOST or yellow form)  Does patient want to make changes to advanced directive? No - Patient declined  Copy of advanced directive(s) in chart? Yes     Chief Complaint  Patient presents with  . Acute Visit    Acute Concerns    HPI:  Pt is a 82 y.o. male seen today at [facility] for an acute visit for follow up FEES swallow test results. He has a medical history of Parkinson's disease, Transient cerebral ischemia among others. He is seen in his room today lying in bed with eyes open. His results FEES swallow test results showed Laryngopharyngeal reflux recommended starting on a PPI or H2 blocker. Patient already on a mechanical soft diet. Patient's HPI and ROS limited due to patient's cognitive impairment. Facility staff reports no new concerns.    Past Medical History  Diagnosis Date  . 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 Unspecified transient cerebral ischemia   . Hallucinations   . Other vitamin B12 deficiency anemia   . Paralysis agitans (HCC)   . Parkinson's disease (HCC) 03/27/2013   Past Surgical History  Procedure  Laterality Date  . Cholecystectomy    . Transurethral resection of prostate    . Appendectomy    . Ptca  1987    No Known Allergies    Medication List       This list is accurate as of: 09/21/15  8:59 AM.  Always use your most recent med list.               aspirin 325 MG tablet  Take 325 mg by mouth daily.     carbidopa-levodopa 25-100 MG tablet  Commonly known as:  SINEMET IR  1.5 tablets by mouth three times daily. Dosing times : 1 hour before meals or 2 hours after meals.     donepezil 10 MG tablet  Commonly known as:  ARICEPT  Take 1 tablet by mouth at  bedtime     fish oil-omega-3 fatty acids 1000 MG capsule  Take 1,200 mg by mouth daily.     Maltodextrin-Xanthan Gum Powd  Take 1 g by mouth daily as needed. High Risk of Aspiration     multivitamin-lutein Caps capsule  Take 1 capsule by mouth daily.     oxybutynin 5 MG tablet  Commonly known as:  DITROPAN  Take 1 tablet by mouth  nightly     OXYGEN  Inhale 3 L into the lungs. 3 Lpm     triamcinolone cream 0.1 %  Commonly known as:  KENALOG  Apply 1 application topically 2 (two) times daily.     TYLENOL 500 MG  tablet  Generic drug:  acetaminophen  Take 500 mg by mouth every 6 (six) hours as needed for moderate pain.     Vitamin D-3 1000 units Caps  Take 1 capsule by mouth daily.     zinc oxide 11.3 % Crea cream  Commonly known as:  BALMEX  Apply balmex to scrotum every shift for redness.        Review of Systems  Unable to perform ROS: Other    Immunization History  Administered Date(s) Administered  . PPD Test 09/05/2015   Pertinent  Health Maintenance Due  Topic Date Due  . PNA vac Low Risk Adult (1 of 2 - PCV13) 08/28/1994  . INFLUENZA VACCINE  12/14/2015   No flowsheet data found. Functional Status Survey:    Filed Vitals:   09/21/15 0829  BP: 148/63  Pulse: 56  Temp: 98.7 F (37.1 C)  Resp: 20  Height: 5\' 2"  (1.575 m)  Weight: 152 lb (68.947 kg)  SpO2: 98%   Body mass  index is 27.79 kg/(m^2). Physical Exam  Constitutional: He appears well-developed and well-nourished. No distress.  HENT:  Head: Normocephalic.  Mouth/Throat: Oropharynx is clear and moist.  Eyes: Conjunctivae and EOM are normal. Pupils are equal, round, and reactive to light. Right eye exhibits no discharge. Left eye exhibits no discharge. No scleral icterus.  Neck: Normal range of motion. No JVD present.  Cardiovascular: Normal rate, regular rhythm, normal heart sounds and intact distal pulses.  Exam reveals no gallop and no friction rub.   No murmur heard. Pulmonary/Chest: Effort normal and breath sounds normal. No respiratory distress. He has no wheezes. He has no rales.  Abdominal: Soft. Bowel sounds are normal. He exhibits no distension. There is no tenderness. There is no rebound and no guarding.  Lymphadenopathy:    He has no cervical adenopathy.  Neurological: He is alert.  Alert and oriented to person. Speech mumble.   Skin: Skin is warm and dry. No rash noted. No erythema. No pallor.  Psychiatric: He has a normal mood and affect.    Labs reviewed:  Recent Labs  09/02/15 0455 09/03/15 0514 09/04/15 0513 09/05/15 09/05/15 0539 09/08/15  NA 143 144 139 142 142 141  K 4.3 4.6 4.2  --  4.0 4.6  CL 112* 111 106  --  108  --   CO2 24 24 22   --  26  --   GLUCOSE 100* 109* 84  --  106*  --   BUN 24* 18 15 14 14 14   CREATININE 1.05 0.84 0.84 0.8 0.83 0.9  CALCIUM 9.1 10.1 9.3  --  9.8  --   MG 1.9  --   --   --   --   --     Recent Labs  08/30/15 0450 08/31/15 2013 09/08/15  AST 20 33 38  ALT 14* 7* 27  ALKPHOS 54 61 69  BILITOT 0.9 0.6  --   PROT 6.0* 6.1*  --   ALBUMIN 3.3* 3.2*  --     Recent Labs  08/30/15 0450 08/31/15 2013  09/03/15 0514 09/04/15 0513 09/05/15 09/05/15 0539 09/08/15  WBC 8.5 16.0*  < > 7.5 6.0 5.2 5.2 7.3  NEUTROABS 5.5 14.2*  --   --   --   --   --   --   HGB 11.6* 11.4*  < > 10.8* 10.6*  --  10.6* 11.9*  HCT 35.3* 34.2*  < > 33.1*  32.4*  --  31.9*  38*  MCV 91.2 91.0  < > 92.7 91.5  --  89.9  --   PLT 251 215  < > 175 199  --  230 375  < > = values in this interval not displayed. Lab Results  Component Value Date   TSH 2.336 08/30/2015   No results found for: HGBA1C No results found for: CHOL, HDL, LDLCALC, LDLDIRECT, TRIG, CHOLHDL  Significant Diagnostic Results in last 30 days:  Dg Pelvis 1-2 Views  08/29/2015  CLINICAL DATA:  Fall, unwitnessed. EXAM: PELVIS - 1-2 VIEW COMPARISON:  None. FINDINGS: No fracture or diastasis in the pelvis. No evidence of hip dislocation on this frontal view. Degenerative changes in the visualized lower lumbar spine. Diffuse osteopenia. No focal osseous lesions. IMPRESSION: No fracture or diastasis in the pelvis.  Diffuse osteopenia. Electronically Signed   By: Delbert Phenix M.D.   On: 08/29/2015 18:02   Ct Head Wo Contrast  09/04/2015  CLINICAL DATA:  82 year old male with Parkinson's disease and dementia status post fall. EXAM: CT HEAD WITHOUT CONTRAST TECHNIQUE: Contiguous axial images were obtained from the base of the skull through the vertex without intravenous contrast. COMPARISON:  Recent prior CT scan of the head 08/29/2015 FINDINGS: Negative for acute intracranial hemorrhage, acute infarction, mass, mass effect, hydrocephalus or midline shift. Gray-white differentiation is preserved throughout. Stable advanced atrophy. Mild ex vacuo dilatation of the lateral ventricles. No acute soft tissue or calvarial abnormality. Normal aeration of the mastoid air cells and visualized paranasal sinus. Advanced atherosclerotic calcification of the bilateral cavernous and supraclinoid carotid arteries. IMPRESSION: 1. No acute intracranial process. 2. Stable advanced atrophy with and mild chronic microvascular ischemic white matter disease. Electronically Signed   By: Malachy Moan M.D.   On: 09/04/2015 10:37   Ct Head Wo Contrast  08/29/2015  CLINICAL DATA:  Patient status post fall. History of  Parkinson's. Confusion. No reported loss consciousness. EXAM: CT HEAD WITHOUT CONTRAST CT CERVICAL SPINE WITHOUT CONTRAST TECHNIQUE: Multidetector CT imaging of the head and cervical spine was performed following the standard protocol without intravenous contrast. Multiplanar CT image reconstructions of the cervical spine were also generated. COMPARISON:  MRI brain 07/08/2009 FINDINGS: CT HEAD FINDINGS Ventricles and sulci are appropriate for patient's age. Periventricular and subcortical white matter hypodensity compatible with chronic microvascular ischemic changes. Orbits are unremarkable. Scalp soft tissues are unremarkable. Paranasal sinuses are well aerated. Mastoid air cells are unremarkable. Calvarium is intact. CT CERVICAL SPINE FINDINGS Normal anatomic alignment. Osseous fusion of the C2-3 and C3-4 levels. Additionally there is grade 1 anterolisthesis of C6-7 likely secondary to facet degenerative changes and fusion at this level. There is right-sided facet fusion at the C6-7 and C7-T1 levels. The craniocervical junction is intact. Relative preservation of the vertebral body heights. IMPRESSION: No acute intracranial process. No acute cervical spine fracture. Chronic microvascular ischemic changes. Multilevel cervical spinal fusion and associated degenerative changes. Electronically Signed   By: Annia Belt M.D.   On: 08/29/2015 18:14   Ct Cervical Spine Wo Contrast  08/29/2015  CLINICAL DATA:  Patient status post fall. History of Parkinson's. Confusion. No reported loss consciousness. EXAM: CT HEAD WITHOUT CONTRAST CT CERVICAL SPINE WITHOUT CONTRAST TECHNIQUE: Multidetector CT imaging of the head and cervical spine was performed following the standard protocol without intravenous contrast. Multiplanar CT image reconstructions of the cervical spine were also generated. COMPARISON:  MRI brain 07/08/2009 FINDINGS: CT HEAD FINDINGS Ventricles and sulci are appropriate for patient's age. Periventricular and  subcortical white matter hypodensity compatible  with chronic microvascular ischemic changes. Orbits are unremarkable. Scalp soft tissues are unremarkable. Paranasal sinuses are well aerated. Mastoid air cells are unremarkable. Calvarium is intact. CT CERVICAL SPINE FINDINGS Normal anatomic alignment. Osseous fusion of the C2-3 and C3-4 levels. Additionally there is grade 1 anterolisthesis of C6-7 likely secondary to facet degenerative changes and fusion at this level. There is right-sided facet fusion at the C6-7 and C7-T1 levels. The craniocervical junction is intact. Relative preservation of the vertebral body heights. IMPRESSION: No acute intracranial process. No acute cervical spine fracture. Chronic microvascular ischemic changes. Multilevel cervical spinal fusion and associated degenerative changes. Electronically Signed   By: Annia Belt M.D.   On: 08/29/2015 18:14   Dg Chest Port 1 View  08/31/2015  CLINICAL DATA:  Fever. EXAM: PORTABLE CHEST 1 VIEW COMPARISON:  August 29, 2015. FINDINGS: Stable cardiomediastinal silhouette. No pneumothorax is noted. Right lung is clear. Increased left basilar opacity is noted consistent with worsening atelectasis or possibly pneumonia. Minimal left pleural effusion is noted. Old right rib fractures are noted. Atherosclerosis thoracic aorta is noted. IMPRESSION: Increased left basilar opacity is noted concerning for worsening atelectasis or pneumonia. Possible minimal left pleural effusion is noted as well. Electronically Signed   By: Lupita Raider, M.D.   On: 08/31/2015 20:13   Portable Chest 1 View  08/29/2015  CLINICAL DATA:  Patient with multiple unwitnessed falls. Right-sided rib pain. Initial encounter. EXAM: PORTABLE CHEST 1 VIEW COMPARISON:  Chest radiograph 07/08/2009 FINDINGS: Monitoring leads overlie the patient. Low lung volumes. Stable cardiac and mediastinal contours. Minimal heterogeneous opacities left lung base. No pleural effusion or pneumothorax.  Age-indeterminate but likely chronic right fourth and fifth rib fractures. IMPRESSION: Age-indeterminate but likely chronic right fourth and fifth rib fractures. Low lung volumes with basilar opacities favored to represent atelectasis. Electronically Signed   By: Annia Belt M.D.   On: 08/29/2015 20:57   Dg Swallowing Func-speech Pathology  09/01/2015  Objective Swallowing Evaluation: Type of Study: MBS-Modified Barium Swallow Study Patient Details Name: Michael [name] MRN: [id number] Date of Birth: 07/22/1927 Today's Date: 09/01/2015 Time: SLP Start Time (ACUTE ONLY): 1310-SLP Stop Time (ACUTE ONLY): 1340 SLP Time Calculation (min) (ACUTE ONLY): 30 min Past Medical History: Past Medical History Diagnosis Date . 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 Unspecified transient cerebral ischemia  . Hallucinations  . Other vitamin B12 deficiency anemia  . Paralysis agitans (HCC)  . Parkinson's disease (HCC) 03/27/2013 Past Surgical History: Past Surgical History Procedure Laterality Date . Cholecystectomy   . Transurethral resection of prostate   . Appendectomy   . Ptca  1987 HPI: pt is an 82 yo male with h/p Parkinson's disease admitted to hospital with AMS, acute encephalopathy, lethargy.  RN reports pt's ammonia levels are off.  Pt has had multiple recent falls- has rib fractures.  CXR 4/16 favors ATX,  but pt spiked temps overnight and CXR worsening.  CT head negative for acute change.  MBS indicated.  Subjective: pt awake in chair Assessment / Plan / Recommendation CHL IP CLINICAL IMPRESSIONS 09/01/2015 Therapy Diagnosis Moderate oral phase dysphagia;Moderate pharyngeal phase dysphagia;Mild cervical esophageal phase dysphagia Clinical Impression Pt presents with moderate oropharyngeal dysphagia characterized by sensorimotor deficits due to his Parkinson's disease. Weakness and sensory deficits result in delayed oral transiting, lingual pumping and delayed pharyngeal swallow  response. Mild oropharyngeal residuals present without pt awareness. Cued dry swallows were difficult for him to conduct but were effective when achieved. Pt did tracely  SILENTLY aspirate thin mixed with secretions due to delayed pharyngeal swallow. Aspirates cleared with cued cough. Pt did have retained secretions in mouth and wet voice prior to starting MBS - suspect aspiration of secretions ongoing.Of note, pt also appeared with decreased CP compliance = allowing minimal backflow at proximal esophagus without pt awareness. This could also contribute to pt's aspiration risk. Recommendations Continue Dys3/nectar diet. Medicine with applesauce (crush if large and not contraindicated).Liquids via cup , follow solids with liquids, intermittent dry swallow, oral care after meals, cough if voice gurgly Follow up SLP at next venue of care for dysphagia management.  Impact on safety and function Moderate aspiration risk;Severe aspiration risk   CHL IP TREATMENT RECOMMENDATION 09/01/2015 Treatment Recommendations Therapy as outlined in treatment plan below   Prognosis 09/01/2015 Prognosis for Safe Diet Advancement Fair Barriers to Reach Goals Time post onset Barriers/Prognosis Comment -- CHL IP DIET RECOMMENDATION 09/01/2015 SLP Diet Recommendations Ice chips PRN after oral care;Dysphagia 3 (Mech soft) solids;Nectar thick liquid Liquid Administration via -- Medication Administration Crushed with puree Compensations Minimize environmental distractions;Slow rate;Small sips/bites;Follow solids with liquid;Hard cough after swallow Postural Changes --   No flowsheet data found.  CHL IP FOLLOW UP RECOMMENDATIONS 09/01/2015 Follow up Recommendations Home health SLP   CHL IP FREQUENCY AND DURATION 09/01/2015 Speech Therapy Frequency (ACUTE ONLY) min 2x/week Treatment Duration 2 weeks      CHL IP ORAL PHASE 09/01/2015 Oral Phase Impaired Oral - Pudding Teaspoon -- Oral - Pudding Cup -- Oral - Honey Teaspoon -- Oral - Honey Cup --  Oral - Nectar Teaspoon Weak lingual manipulation;Lingual pumping;Piecemeal swallowing;Delayed oral transit Oral - Nectar Cup Weak lingual manipulation;Lingual pumping;Piecemeal swallowing;Delayed oral transit;Premature spillage Oral - Nectar Straw -- Oral - Thin Teaspoon Weak lingual manipulation;Lingual pumping;Reduced posterior propulsion;Piecemeal swallowing;Delayed oral transit;Decreased bolus cohesion Oral - Thin Cup Weak lingual manipulation;Reduced posterior propulsion;Delayed oral transit;Premature spillage Oral - Thin Straw Weak lingual manipulation;Lingual pumping;Reduced posterior propulsion;Lingual/palatal residue;Delayed oral transit Oral - Puree Weak lingual manipulation;Lingual pumping;Lingual/palatal residue;Piecemeal swallowing;Delayed oral transit Oral - Mech Soft -- Oral - Regular Impaired mastication;Weak lingual manipulation;Lingual pumping;Lingual/palatal residue;Piecemeal swallowing;Delayed oral transit Oral - Multi-Consistency -- Oral - Pill -- Oral Phase - Comment pt without awareness to mild oral residuals  CHL IP PHARYNGEAL PHASE 09/01/2015 Pharyngeal Phase Impaired Pharyngeal- Pudding Teaspoon -- Pharyngeal -- Pharyngeal- Pudding Cup -- Pharyngeal -- Pharyngeal- Honey Teaspoon -- Pharyngeal -- Pharyngeal- Honey Cup -- Pharyngeal -- Pharyngeal- Nectar Teaspoon Delayed swallow initiation-pyriform sinuses Pharyngeal -- Pharyngeal- Nectar Cup Delayed swallow initiation-pyriform sinuses Pharyngeal -- Pharyngeal- Nectar Straw -- Pharyngeal -- Pharyngeal- Thin Teaspoon Delayed swallow initiation-pyriform sinuses Pharyngeal -- Pharyngeal- Thin Cup Delayed swallow initiation-pyriform sinuses Pharyngeal -- Pharyngeal- Thin Straw Delayed swallow initiation-pyriform sinuses Pharyngeal -- Pharyngeal- Puree Delayed swallow initiation-vallecula Pharyngeal -- Pharyngeal- Mechanical Soft -- Pharyngeal -- Pharyngeal- Regular Delayed swallow initiation-vallecula Pharyngeal -- Pharyngeal- Multi-consistency --  Pharyngeal -- Pharyngeal- Pill -- Pharyngeal -- Pharyngeal Comment --  CHL IP CERVICAL ESOPHAGEAL PHASE 09/01/2015 Cervical Esophageal Phase Impaired Pudding Teaspoon -- Pudding Cup -- Honey Teaspoon -- Honey Cup -- Nectar Teaspoon Reduced cricopharyngeal relaxation;Esophageal backflow into cervical esophagus Nectar Cup Reduced cricopharyngeal relaxation;Esophageal backflow into cervical esophagus Nectar Straw -- Thin Teaspoon Reduced cricopharyngeal relaxation;Esophageal backflow into cervical esophagus Thin Cup Reduced cricopharyngeal relaxation;Esophageal backflow into cervical esophagus Thin Straw Reduced cricopharyngeal relaxation;Esophageal backflow into cervical esophagus Puree -- Mechanical Soft -- Regular -- Multi-consistency -- Pill -- Cervical Esophageal Comment pt without senstion to appearance of mild backflow [name], MS [facility] SLP [phone]  Assessment/Plan  Dysphagia  Continue on mechanical soft diet. Aspiration precautions.  GERD  FEES swallow test results showed Laryngopharyngeal reflux recommended starting on a PPI or H2 blocker. Start Omeprazole 20 mg Capsule daily.Continue to monitor.      Family/ staff Communication: Reviewed plan with facility Nurse supervisor. Labs/tests ordered:  None

## 2015-09-29 ENCOUNTER — Non-Acute Institutional Stay (SKILLED_NURSING_FACILITY): Payer: Medicare Other | Admitting: Family

## 2015-09-29 ENCOUNTER — Encounter: Payer: Self-pay | Admitting: Family

## 2015-09-29 DIAGNOSIS — R059 Cough, unspecified: Secondary | ICD-10-CM

## 2015-09-29 DIAGNOSIS — R1310 Dysphagia, unspecified: Secondary | ICD-10-CM

## 2015-09-29 DIAGNOSIS — G20 Parkinson's disease: Secondary | ICD-10-CM | POA: Diagnosis not present

## 2015-09-29 DIAGNOSIS — G20A1 Parkinson's disease without dyskinesia, without mention of fluctuations: Secondary | ICD-10-CM

## 2015-09-29 DIAGNOSIS — F0390 Unspecified dementia without behavioral disturbance: Secondary | ICD-10-CM | POA: Diagnosis not present

## 2015-09-29 DIAGNOSIS — R05 Cough: Secondary | ICD-10-CM

## 2015-09-30 NOTE — Progress Notes (Signed)
Patient ID: Michael [name], male   DOB: October 23, 1927, 82 y.o.   MRN: [id number]  Location:  [facility] Room Number: 102-P Place of Service:  SNF (31) Provider:  Dinah Ngetich FNP-C Oneal Grout, MD    Lupe Carney, MD  Patient Care Team: L.Lupe Carney, MD as PCP - General (Family Medicine)  Extended Emergency Contact Information Primary Emergency Contact: Matheney,[name] Address: [phone]          [place] [address] [name] Home Phone: [phone] Relation: None Secondary Emergency Contact: [facility] Address: [address]          [place], [place] [address] [name] of [place] Home Phone: [phone] Relation: Son  Code Status: DNR Goals of care: Advanced Directive information Advanced Directives 09/21/2015  Does patient have an advance directive? Yes  Type of Advance Directive Out of facility DNR (pink MOST or yellow form)  Does patient want to make changes to advanced directive? No - Patient declined  Copy of advanced directive(s) in chart? Yes     Chief Complaint  Patient presents with  . Medical Management of Chronic Issues    Routine Visit    HPI:  Pt is a 82 y.o. male seen today at [facility] for medical management of chronic diseases. He has a medical history of Parkinson's disease, Dementia, failure to thrive among others. He is seen in his room today with wife at bedside. Patient's wife reports that he has had a cough which is productive at times whenever he is able to cough it up. She also states husband has been more alert and talking but unable to understand what he says once in a while he will say a word that she can understand. Facility staff continue to assist with ADL's and gets up for meals at the facility dinning room.    Past Medical History  Diagnosis Date  . 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 Unspecified transient cerebral  ischemia   . Hallucinations   . Other vitamin B12 deficiency anemia   . Paralysis agitans (HCC)   . Parkinson's disease (HCC) 03/27/2013   Past Surgical History  Procedure Laterality Date  . Cholecystectomy    . Transurethral resection of prostate    . Appendectomy    . Ptca  1987    No Known Allergies    Medication List       This list is accurate as of: 09/29/15 11:59 PM.  Always use your most recent med list.               aspirin 325 MG tablet  Take 325 mg by mouth daily.     carbidopa-levodopa 25-100 MG tablet  Commonly known as:  SINEMET IR  1.5 tablets by mouth three times daily. Dosing times : 1 hour before meals or 2 hours after meals.     donepezil 10 MG tablet  Commonly known as:  ARICEPT  Take 1 tablet by mouth at  bedtime     fish oil-omega-3 fatty acids 1000 MG capsule  Take 1,000 mg by mouth daily.     Maltodextrin-Xanthan Gum Powd  Take 1 g by mouth daily as needed. High Risk of Aspiration     multivitamin-lutein Caps capsule  Take 1 capsule by mouth daily.     omeprazole 20 MG capsule  Commonly known as:  PRILOSEC  Take 20 mg by mouth daily.  oxybutynin 5 MG tablet  Commonly known as:  DITROPAN  Take 1 tablet by mouth  nightly     OXYGEN  Inhale 3 L into the lungs. 3 Lpm     triamcinolone cream 0.1 %  Commonly known as:  KENALOG  Apply 1 application topically 2 (two) times daily.     TYLENOL 500 MG tablet  Generic drug:  acetaminophen  Take 500 mg by mouth every 6 (six) hours as needed for moderate pain.     Vitamin D-3 1000 units Caps  Take 1 capsule by mouth daily.     zinc oxide 11.3 % Crea cream  Commonly known as:  BALMEX  Apply balmex to scrotum every shift for redness.        Review of Systems  Unable to perform ROS: Dementia    Immunization History  Administered Date(s) Administered  . PPD Test 09/05/2015, 09/20/2015   Pertinent  Health Maintenance Due  Topic Date Due  . PNA vac Low Risk Adult (1 of 2 -  PCV13) 05/15/2016 (Originally 08/28/1994)  . INFLUENZA VACCINE  12/14/2015   No flowsheet data found. Functional Status Survey:    Filed Vitals:   09/29/15 1023  BP: 146/61  Pulse: 54  Temp: 98.6 F (37 C)  TempSrc: Oral  Resp: 18  Height:  (1.575 m)  Weight: 152 lb (68.947 kg)  SpO2: 96%   Body mass index is 27.79 kg/(m^2). Physical Exam  Constitutional: He appears well-developed and well-nourished. No distress.  HENT:  Head: Normocephalic.  Mouth/Throat: Oropharynx is clear and moist.  Eyes: Conjunctivae and EOM are normal. Pupils are equal, round, and reactive to light. Right eye exhibits no discharge. Left eye exhibits no discharge. No scleral icterus.  Neck: Normal range of motion. No JVD present.  Cardiovascular: Normal rate, regular rhythm, normal heart sounds and intact distal pulses.  Exam reveals no gallop and no friction rub.   No murmur heard. Pulmonary/Chest: Effort normal. No respiratory distress. He has no wheezes.  Rales upper lobes  Abdominal: Soft. Bowel sounds are normal. He exhibits no distension. There is no tenderness. There is no rebound and no guarding.  Musculoskeletal: Normal range of motion. He exhibits no edema or tenderness.  Lymphadenopathy:    He has no cervical adenopathy.  Neurological: He is alert.  Mumble speech   Skin: Skin is warm and dry. No rash noted. No erythema. No pallor.  Psychiatric: He has a normal mood and affect.    Labs reviewed:  Recent Labs  09/02/15 0455 09/03/15 0514 09/04/15 0513 09/05/15 09/05/15 0539 09/08/15  NA 143 144 139 142 142 141  K 4.3 4.6 4.2  --  4.0 4.6  CL 112* 111 106  --  108  --   CO2 --  26  --   GLUCOSE 100* 109* 84  --  106*  --   BUN 24* CREATININE 1.05 0.84 0.84 0.8 0.83 0.9  CALCIUM 9.1 10.1 9.3  --  9.8  --   MG 1.9  --   --   --   --   --     Recent Labs  08/30/15 0450 08/31/15 2013 09/08/15  AST 20 33 38  ALT 14* 7* 27  ALKPHOS 54 61 69    BILITOT 0.9 0.6  --   PROT 6.0* 6.1*  --   ALBUMIN 3.3* 3.2*  --     Recent Labs  08/30/15 0450  08/31/15 2013  09/03/15 0514 09/04/15 0513 09/05/15 09/05/15 0539 09/08/15  WBC 8.5 16.0*  < > 7.5 6.0 5.2 5.2 7.3  NEUTROABS 5.5 14.2*  --   --   --   --   --   --   HGB 11.6* 11.4*  < > 10.8* 10.6*  --  10.6* 11.9*  HCT 35.3* 34.2*  < > 33.1* 32.4*  --  31.9* 38*  MCV 91.2 91.0  < > 92.7 91.5  --  89.9  --   PLT 251 215  < > 175 199  --  230 375  < > = values in this interval not displayed. Lab Results  Component Value Date   TSH 2.336 08/30/2015   No results found for: HGBA1C No results found for: CHOL, HDL, LDLCALC, LDLDIRECT, TRIG, CHOLHDL  Significant Diagnostic Results in last 30 days:  Ct Head Wo Contrast  09/04/2015  CLINICAL DATA:  82 year old male with Parkinson's disease and dementia status post fall. EXAM: CT HEAD WITHOUT CONTRAST TECHNIQUE: Contiguous axial images were obtained from the base of the skull through the vertex without intravenous contrast. COMPARISON:  Recent prior CT scan of the head 08/29/2015 FINDINGS: Negative for acute intracranial hemorrhage, acute infarction, mass, mass effect, hydrocephalus or midline shift. Gray-white differentiation is preserved throughout. Stable advanced atrophy. Mild ex vacuo dilatation of the lateral ventricles. No acute soft tissue or calvarial abnormality. Normal aeration of the mastoid air cells and visualized paranasal sinus. Advanced atherosclerotic calcification of the bilateral cavernous and supraclinoid carotid arteries. IMPRESSION: 1. No acute intracranial process. 2. Stable advanced atrophy with and mild chronic microvascular ischemic white matter disease. Electronically Signed   By: Malachy MoanHeath  McCullough M.D.   On: 09/04/2015 10:37   Dg Chest Port 1 View  08/31/2015  CLINICAL DATA:  Fever. EXAM: PORTABLE CHEST 1 VIEW COMPARISON:  August 29, 2015. FINDINGS: Stable cardiomediastinal silhouette. No pneumothorax is noted. Right  lung is clear. Increased left basilar opacity is noted consistent with worsening atelectasis or possibly pneumonia. Minimal left pleural effusion is noted. Old right rib fractures are noted. Atherosclerosis thoracic aorta is noted. IMPRESSION: Increased left basilar opacity is noted concerning for worsening atelectasis or pneumonia. Possible minimal left pleural effusion is noted as well. Electronically Signed   By: Lupita RaiderJames  Green Jr, M.D.   On: 08/31/2015 20:13   Dg Swallowing Func-speech Pathology  09/01/2015  Objective Swallowing Evaluation: Type of Study: MBS-Modified Barium Swallow Study Patient Details Name: Fabiola BackerBobby R [name] MRN: [id number] Date of Birth: 1928-04-16 Today's Date: 09/01/2015 Time: SLP Start Time (ACUTE ONLY): 1310-SLP Stop Time (ACUTE ONLY): 1340 SLP Time Calculation (min) (ACUTE ONLY): 30 min Past Medical History: Past Medical History Diagnosis Date . 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 Unspecified transient cerebral ischemia  . Hallucinations  . Other vitamin B12 deficiency anemia  . Paralysis agitans (HCC)  . Parkinson's disease (HCC) 03/27/2013 Past Surgical History: Past Surgical History Procedure Laterality Date . Cholecystectomy   . Transurethral resection of prostate   . Appendectomy   . Ptca  1987 HPI: pt is an 82 yo male with h/p Parkinson's disease admitted to hospital with AMS, acute encephalopathy, lethargy.  RN reports pt's ammonia levels are off.  Pt has had multiple recent falls- has rib fractures.  CXR 4/16 favors ATX,  but pt spiked temps overnight and CXR worsening.  CT head negative for acute change.  MBS indicated.  Subjective: pt awake in chair Assessment / Plan / Recommendation CHL  IP CLINICAL IMPRESSIONS 09/01/2015 Therapy Diagnosis Moderate oral phase dysphagia;Moderate pharyngeal phase dysphagia;Mild cervical esophageal phase dysphagia Clinical Impression Pt presents with moderate oropharyngeal dysphagia characterized by sensorimotor  deficits due to his Parkinson's disease. Weakness and sensory deficits result in delayed oral transiting, lingual pumping and delayed pharyngeal swallow response. Mild oropharyngeal residuals present without pt awareness. Cued dry swallows were difficult for him to conduct but were effective when achieved. Pt did tracely SILENTLY aspirate thin mixed with secretions due to delayed pharyngeal swallow. Aspirates cleared with cued cough. Pt did have retained secretions in mouth and wet voice prior to starting MBS - suspect aspiration of secretions ongoing.Of note, pt also appeared with decreased CP compliance = allowing minimal backflow at proximal esophagus without pt awareness. This could also contribute to pt's aspiration risk. Recommendations Continue Dys3/nectar diet. Medicine with applesauce (crush if large and not contraindicated).Liquids via cup , follow solids with liquids, intermittent dry swallow, oral care after meals, cough if voice gurgly Follow up SLP at next venue of care for dysphagia management.  Impact on safety and function Moderate aspiration risk;Severe aspiration risk   CHL IP TREATMENT RECOMMENDATION 09/01/2015 Treatment Recommendations Therapy as outlined in treatment plan below   Prognosis 09/01/2015 Prognosis for Safe Diet Advancement Fair Barriers to Reach Goals Time post onset Barriers/Prognosis Comment -- CHL IP DIET RECOMMENDATION 09/01/2015 SLP Diet Recommendations Ice chips PRN after oral care;Dysphagia 3 (Mech soft) solids;Nectar thick liquid Liquid Administration via -- Medication Administration Crushed with puree Compensations Minimize environmental distractions;Slow rate;Small sips/bites;Follow solids with liquid;Hard cough after swallow Postural Changes --   No flowsheet data found.  CHL IP FOLLOW UP RECOMMENDATIONS 09/01/2015 Follow up Recommendations Home health SLP   CHL IP FREQUENCY AND DURATION 09/01/2015 Speech Therapy Frequency (ACUTE ONLY) min 2x/week Treatment Duration 2  weeks      CHL IP ORAL PHASE 09/01/2015 Oral Phase Impaired Oral - Pudding Teaspoon -- Oral - Pudding Cup -- Oral - Honey Teaspoon -- Oral - Honey Cup -- Oral - Nectar Teaspoon Weak lingual manipulation;Lingual pumping;Piecemeal swallowing;Delayed oral transit Oral - Nectar Cup Weak lingual manipulation;Lingual pumping;Piecemeal swallowing;Delayed oral transit;Premature spillage Oral - Nectar Straw -- Oral - Thin Teaspoon Weak lingual manipulation;Lingual pumping;Reduced posterior propulsion;Piecemeal swallowing;Delayed oral transit;Decreased bolus cohesion Oral - Thin Cup Weak lingual manipulation;Reduced posterior propulsion;Delayed oral transit;Premature spillage Oral - Thin Straw Weak lingual manipulation;Lingual pumping;Reduced posterior propulsion;Lingual/palatal residue;Delayed oral transit Oral - Puree Weak lingual manipulation;Lingual pumping;Lingual/palatal residue;Piecemeal swallowing;Delayed oral transit Oral - Mech Soft -- Oral - Regular Impaired mastication;Weak lingual manipulation;Lingual pumping;Lingual/palatal residue;Piecemeal swallowing;Delayed oral transit Oral - Multi-Consistency -- Oral - Pill -- Oral Phase - Comment pt without awareness to mild oral residuals  CHL IP PHARYNGEAL PHASE 09/01/2015 Pharyngeal Phase Impaired Pharyngeal- Pudding Teaspoon -- Pharyngeal -- Pharyngeal- Pudding Cup -- Pharyngeal -- Pharyngeal- Honey Teaspoon -- Pharyngeal -- Pharyngeal- Honey Cup -- Pharyngeal -- Pharyngeal- Nectar Teaspoon Delayed swallow initiation-pyriform sinuses Pharyngeal -- Pharyngeal- Nectar Cup Delayed swallow initiation-pyriform sinuses Pharyngeal -- Pharyngeal- Nectar Straw -- Pharyngeal -- Pharyngeal- Thin Teaspoon Delayed swallow initiation-pyriform sinuses Pharyngeal -- Pharyngeal- Thin Cup Delayed swallow initiation-pyriform sinuses Pharyngeal -- Pharyngeal- Thin Straw Delayed swallow initiation-pyriform sinuses Pharyngeal -- Pharyngeal- Puree Delayed swallow initiation-vallecula  Pharyngeal -- Pharyngeal- Mechanical Soft -- Pharyngeal -- Pharyngeal- Regular Delayed swallow initiation-vallecula Pharyngeal -- Pharyngeal- Multi-consistency -- Pharyngeal -- Pharyngeal- Pill -- Pharyngeal -- Pharyngeal Comment --  CHL IP CERVICAL ESOPHAGEAL PHASE 09/01/2015 Cervical Esophageal Phase Impaired Pudding Teaspoon -- Pudding Cup -- Honey Teaspoon -- Honey Cup -- Nectar Teaspoon  Reduced cricopharyngeal relaxation;Esophageal backflow into cervical esophagus Nectar Cup Reduced cricopharyngeal relaxation;Esophageal backflow into cervical esophagus Nectar Straw -- Thin Teaspoon Reduced cricopharyngeal relaxation;Esophageal backflow into cervical esophagus Thin Cup Reduced cricopharyngeal relaxation;Esophageal backflow into cervical esophagus Thin Straw Reduced cricopharyngeal relaxation;Esophageal backflow into cervical esophagus Puree -- Mechanical Soft -- Regular -- Multi-consistency -- Pill -- Cervical Esophageal Comment pt without senstion to appearance of mild backflow [name], MS [facility] SLP [phone]               Assessment/Plan 1. Parkinson's disease (HCC) Progressive decline. Continue on carbidopa-Levodopa 25/100 mg Tablet. Assist with ADL's. Continue palliative care.   2. Dementia, without behavioral disturbance Progressive decline.Continue Aricept 10 mg Tablet. Assit with ADL's. Aspiration precautions. Skin Care   3. Cough Afebrile. Productive at times per pt's wife. Upper lobes rales on auscultation. Will obtain portable CXR 2 PA/lat r/o PNA  Due to high risk for aspiration.    4. Dysphagia Continue on mechanical soft diet with Thicken liquids. Aspiration precaution.     Family/ staff Communication:Reviewed plan of care with patient's wife and facility Nurse supervisor.  Labs/tests ordered: CXR 2 PA/lat r/o PNA

## 2015-10-05 ENCOUNTER — Non-Acute Institutional Stay (SKILLED_NURSING_FACILITY): Payer: Medicare Other | Admitting: Family

## 2015-10-05 ENCOUNTER — Encounter: Payer: Self-pay | Admitting: Family

## 2015-10-05 DIAGNOSIS — H1032 Unspecified acute conjunctivitis, left eye: Secondary | ICD-10-CM | POA: Diagnosis not present

## 2015-10-05 NOTE — Progress Notes (Signed)
Patient ID: Michael [name], male   DOB: 06/17/1927, 82 y.o.   MRN: [id number]  Location:  [facility] Room Number: 102 Place of Service:  SNF [phone]) Provider: Niva Murren FNP-C   Lupe Carney, MD  Patient Care Team: L.Lupe Carney, MD as PCP - General (Family Medicine)  Extended Emergency Contact Information Primary Emergency Contact: Schroader,[name] Address: [phone]          [place] [address] [name] Home Phone: [phone] Relation: None Secondary Emergency Contact: [facility] Address: [address]          [place], [place] [address] [name] Home Phone: [phone] Relation: Son  Code Status: DNR Goals of care: Advanced Directive information Advanced Directives 10/05/2015  Does patient have an advance directive? Yes  Type of Advance Directive Out of facility DNR (pink MOST or yellow form)  Does patient want to make changes to advanced directive? -  Copy of advanced directive(s) in chart? Yes     Chief Complaint  Patient presents with  . Acute Visit    Drainage of left eye    HPI: Pt is a 82 y.o. male seen today at [facility] for an acute visit for evaluation of left eye drainage. He has a significant medical history of Parkinson disease, Dementia, Dysphagia among others. He is seen in his room today with wife at bedside. He is more alert this visit and response verbally though pleasantly confused. Facility staff reports worsening left eye drainage. Patient's wife states patient's left eye eyelashes stick together. Unable to obtain ROS due to patient's mental status and presence of dementia.    Past Medical History  Diagnosis Date  . 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 Unspecified transient cerebral ischemia   . Hallucinations   . Other vitamin B12 deficiency anemia   . Paralysis agitans (HCC)   . Parkinson's disease (HCC)  03/27/2013   Past Surgical History  Procedure Laterality Date  . Cholecystectomy    . Transurethral resection of prostate    . Appendectomy    . Ptca  1987    No Known Allergies    Medication List       This list is accurate as of: 10/05/15  4:19 PM.  Always use your most recent med list.               aspirin 325 MG tablet  Take 325 mg by mouth daily.     carbidopa-levodopa 25-100 MG tablet  Commonly known as:  SINEMET IR  1.5 tablets by mouth three times daily. Dosing times : 1 hour before meals or 2 hours after meals.     donepezil 10 MG tablet  Commonly known as:  ARICEPT  Take 1 tablet by mouth at  bedtime     fish oil-omega-3 fatty acids 1000 MG capsule  Take 1,000 mg by mouth daily.     Maltodextrin-Xanthan Gum Powd  Take 1 g by mouth daily as needed. High Risk of Aspiration     multivitamin-lutein Caps capsule  Take 1 capsule by mouth daily.     omeprazole 20 MG capsule  Commonly known as:  PRILOSEC  Take 20 mg by mouth daily.     oxybutynin 5 MG tablet  Commonly known as:  DITROPAN  Take 1 tablet by mouth  nightly     OXYGEN  Inhale 3 L into the lungs. 3 Lpm  triamcinolone cream 0.1 %  Commonly known as:  KENALOG  Apply 1 application topically 2 (two) times daily.     TYLENOL 500 MG tablet  Generic drug:  acetaminophen  Take 500 mg by mouth every 6 (six) hours as needed for moderate pain.     Vitamin D-3 1000 units Caps  Take 1 capsule by mouth daily.     zinc oxide 11.3 % Crea cream  Commonly known as:  BALMEX  Apply balmex to scrotum every shift for redness.        Review of Systems  Unable to perform ROS: Dementia    Immunization History  Administered Date(s) Administered  . PPD Test 09/05/2015, 09/20/2015   Pertinent  Health Maintenance Due  Topic Date Due  . PNA vac Low Risk Adult (1 of 2 - PCV13) 05/15/2016 (Originally 08/28/1994)  . INFLUENZA VACCINE  12/14/2015   No flowsheet data found. Functional Status Survey:     Filed Vitals:   10/05/15 0947  BP: 151/76  Pulse: 62  Temp: 97.1 F (36.2 C)  TempSrc: Oral  Resp: 20  Height: 5\' 2"  (1.575 m)  Weight: 152 lb (68.947 kg)  SpO2: 90%   Body mass index is 27.79 kg/(m^2). Physical Exam  Constitutional: He appears well-developed and well-nourished. No distress.  Pleasantly confused at baseline  HENT:  Head: Normocephalic.  Mouth/Throat: Oropharynx is clear and moist.  Eyes: Conjunctivae and EOM are normal. Pupils are equal, round, and reactive to light. Right eye exhibits no discharge. No scleral icterus.  Left eye yellow discharge on eyelashes and edges of the eye. No redness noted.   Neck: Neck supple. No JVD present. No thyromegaly present.  Cardiovascular: Normal rate, regular rhythm, normal heart sounds and intact distal pulses.  Exam reveals no gallop and no friction rub.   No murmur heard. Pulmonary/Chest: Effort normal and breath sounds normal. No respiratory distress. He has no wheezes. He has no rales.  Abdominal: Soft. Bowel sounds are normal. He exhibits no distension. There is no tenderness. There is no rebound and no guarding.  Lymphadenopathy:    He has no cervical adenopathy.  Neurological: He is alert.  Skin: Skin is warm and dry. No rash noted. No erythema. No pallor.  Psychiatric: He has a normal mood and affect.    Labs reviewed:  Recent Labs  09/02/15 0455 09/03/15 0514 09/04/15 0513 09/05/15 09/05/15 0539 09/08/15  NA 143 144 139 142 142 141  K 4.3 4.6 4.2  --  4.0 4.6  CL 112* 111 106  --  108  --   CO2 24 24 22   --  26  --   GLUCOSE 100* 109* 84  --  106*  --   BUN 24* 18 15 14 14 14   CREATININE 1.05 0.84 0.84 0.8 0.83 0.9  CALCIUM 9.1 10.1 9.3  --  9.8  --   MG 1.9  --   --   --   --   --     Recent Labs  08/30/15 0450 08/31/15 2013 09/08/15  AST 20 33 38  ALT 14* 7* 27  ALKPHOS 54 61 69  BILITOT 0.9 0.6  --   PROT 6.0* 6.1*  --   ALBUMIN 3.3* 3.2*  --     Recent Labs  08/30/15 0450  08/31/15 2013  09/03/15 0514 09/04/15 0513 09/05/15 09/05/15 0539 09/08/15  WBC 8.5 16.0*  < > 7.5 6.0 5.2 5.2 7.3  NEUTROABS 5.5 14.2*  --   --   --   --   --   --  HGB 11.6* 11.4*  < > 10.8* 10.6*  --  10.6* 11.9*  HCT 35.3* 34.2*  < > 33.1* 32.4*  --  31.9* 38*  MCV 91.2 91.0  < > 92.7 91.5  --  89.9  --   PLT 251 215  < > 175 199  --  230 375  < > = values in this interval not displayed. Lab Results  Component Value Date   TSH 2.336 08/30/2015   No results found for: HGBA1C No results found for: CHOL, HDL, LDLCALC, LDLDIRECT, TRIG, CHOLHDL  Significant Diagnostic Results in last 30 days:  No results found.  Assessment/Plan Bacterial conjunctivitis  Yellow drainage noted. Start Erythromycin Opthalmic 0.5 % ointment apply 1 cm ribbon to lower conjunctiva sac every 6 hrs X 7 days.  Continue eye care hygiene. Continue to monitor.    Family/ staff Communication: Reviewed plan of care with patient, patient's wife and facility Nurse supervisor.  Labs/tests ordered:  None

## 2015-10-28 ENCOUNTER — Encounter: Payer: Self-pay | Admitting: Family

## 2015-10-28 ENCOUNTER — Non-Acute Institutional Stay (SKILLED_NURSING_FACILITY): Payer: Medicare Other | Admitting: Family

## 2015-10-28 DIAGNOSIS — G20 Parkinson's disease: Secondary | ICD-10-CM | POA: Diagnosis not present

## 2015-10-28 DIAGNOSIS — E559 Vitamin D deficiency, unspecified: Secondary | ICD-10-CM | POA: Insufficient documentation

## 2015-10-28 DIAGNOSIS — R1310 Dysphagia, unspecified: Secondary | ICD-10-CM

## 2015-10-28 DIAGNOSIS — F0390 Unspecified dementia without behavioral disturbance: Secondary | ICD-10-CM

## 2015-10-28 NOTE — Progress Notes (Signed)
Patient ID: Michael [name], male   DOB: 24-Dec-1927, 82 y.o.   MRN: [id number]  Location:  [facility] Room Number: 102 Place of Service:  SNF [phone]) Provider: Tetsuo Coppola FNP-C   Lupe Carney, MD  Patient Care Team: L.Lupe Carney, MD as PCP - General (Family Medicine)  Extended Emergency Contact Information Primary Emergency Contact: Billiter,[name] Address: [phone]          [place] [address] [name] Home Phone: [phone] Relation: None Secondary Emergency Contact: [facility] Address: [address]          [place], [place] [address] [name] Home Phone: [phone] Relation: Son  Code Status: DNR  Goals of care: Advanced Directive information Advanced Directives 10/28/2015  Does patient have an advance directive? Yes  Type of Advance Directive Out of facility DNR (pink MOST or yellow form)  Does patient want to make changes to advanced directive? No - Patient declined  Copy of advanced directive(s) in chart? Yes     Chief Complaint  Patient presents with  . Medical Management of Chronic Issues    Routine Visit     HPI:  Pt is a 82 y.o. male seen today  At [facility] for medical management of chronic diseases. He is seen in his room with wife at bedside. She states patient has been talking more often though confused at baseline now. He eat all his breakfast  This morning. He denies any pain then falls back to sleep. He continues to require total care assistance with ADL's. Facility staff reports no new concerns.    Past Medical History  Diagnosis Date  . 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 Unspecified transient cerebral ischemia   . Hallucinations   . Other vitamin B12 deficiency anemia   . Paralysis agitans (HCC)   . Parkinson's disease (HCC) 03/27/2013   Past Surgical History  Procedure Laterality Date  . Cholecystectomy      . Transurethral resection of prostate    . Appendectomy    . Ptca  1987    No Known Allergies    Medication List       This list is accurate as of: 10/28/15  2:39 PM.  Always use your most recent med list.               aspirin 325 MG tablet  Take 325 mg by mouth daily.     carbidopa-levodopa 25-100 MG tablet  Commonly known as:  SINEMET IR  1.5 tablets by mouth three times daily. Dosing times : 1 hour before meals or 2 hours after meals.     donepezil 10 MG tablet  Commonly known as:  ARICEPT  Take 1 tablet by mouth at  bedtime     fish oil-omega-3 fatty acids 1000 MG capsule  Take 1,000 mg by mouth daily.     Maltodextrin-Xanthan Gum Powd  Take 1 g by mouth daily as needed. High Risk of Aspiration     multivitamin-lutein Caps capsule  Take 1 capsule by mouth daily.     omeprazole 20 MG capsule  Commonly known as:  PRILOSEC  Take 20 mg by mouth daily.     oxybutynin 5 MG tablet  Commonly known as:  DITROPAN  Take 1 tablet by mouth  nightly     OXYGEN  Inhale 3 L into the lungs. 3 Lpm     triamcinolone  cream 0.1 %  Commonly known as:  KENALOG  Apply 1 application topically 2 (two) times daily.     TYLENOL 500 MG tablet  Generic drug:  acetaminophen  Take 500 mg by mouth every 6 (six) hours as needed for moderate pain.     Vitamin D-3 1000 units Caps  Take 1 capsule by mouth daily.     zinc oxide 11.3 % Crea cream  Commonly known as:  BALMEX  Apply balmex to scrotum every shift for redness.        Review of Systems  Unable to perform ROS: Dementia    Immunization History  Administered Date(s) Administered  . PPD Test 09/05/2015, 09/20/2015   Pertinent  Health Maintenance Due  Topic Date Due  . PNA vac Low Risk Adult (1 of 2 - PCV13) 05/15/2016 (Originally 08/28/1994)  . INFLUENZA VACCINE  12/14/2015   No flowsheet data found. Functional Status Survey:    Filed Vitals:   10/28/15 1151  BP: 128/60  Pulse: 77  Temp: 97.8 F (36.6 C)   Resp: 20  Height:  (1.575 m)  Weight: 152 lb (68.947 kg)  SpO2: 95%   Body mass index is 27.79 kg/(m^2). Physical Exam  Constitutional: He appears well-developed and well-nourished. No distress.  Pleasantly confused at baseline  HENT:  Head: Normocephalic.  Mouth/Throat: Oropharynx is clear and moist.  Eyes: Conjunctivae and EOM are normal. Pupils are equal, round, and reactive to light. Right eye exhibits no discharge. No scleral icterus.  Neck: Neck supple. No JVD present. No thyromegaly present.  Cardiovascular: Normal rate, regular rhythm, normal heart sounds and intact distal pulses.  Exam reveals no gallop and no friction rub.   No murmur heard. Pulmonary/Chest: Effort normal and breath sounds normal. No respiratory distress. He has no wheezes. He has no rales.  Abdominal: Soft. Bowel sounds are normal. He exhibits no distension. There is no tenderness. There is no rebound and no guarding.  Lymphadenopathy:    He has no cervical adenopathy.  Neurological: He is alert.  Confused at baseline.   Skin: Skin is warm and dry. No rash noted. No erythema. No pallor.  Psychiatric: He has a normal mood and affect.    Labs reviewed:  Recent Labs  09/02/15 0455 09/03/15 0514 09/04/15 0513 09/05/15 09/05/15 0539 09/08/15  NA 143 144 139 142 142 141  K 4.3 4.6 4.2  --  4.0 4.6  CL 112* 111 106  --  108  --   CO2 --  26  --   GLUCOSE 100* 109* 84  --  106*  --   BUN 24* CREATININE 1.05 0.84 0.84 0.8 0.83 0.9  CALCIUM 9.1 10.1 9.3  --  9.8  --   MG 1.9  --   --   --   --   --     Recent Labs  08/30/15 0450 08/31/15 2013 09/08/15  AST 20 33 38  ALT 14* 7* 27  ALKPHOS 54 61 69  BILITOT 0.9 0.6  --   PROT 6.0* 6.1*  --   ALBUMIN 3.3* 3.2*  --     Recent Labs  08/30/15 0450 08/31/15 2013  09/03/15 0514 09/04/15 0513 09/05/15 09/05/15 0539 09/08/15  WBC 8.5 16.0*  < > 7.5 6.0 5.2 5.2 7.3  NEUTROABS 5.5 14.2*  --   --   --   --   --    --   HGB  11.6* 11.4*  < > 10.8* 10.6*  --  10.6* 11.9*  HCT 35.3* 34.2*  < > 33.1* 32.4*  --  31.9* 38*  MCV 91.2 91.0  < > 92.7 91.5  --  89.9  --   PLT 251 215  < > 175 199  --  230 375  < > = values in this interval not displayed. Lab Results  Component Value Date   TSH 2.336 08/30/2015   No results found for: HGBA1C No results found for: CHOL, HDL, LDLCALC, LDLDIRECT, TRIG, CHOLHDL  Significant Diagnostic Results in last 30 days:  No results found.  Assessment/Plan 1. Parkinson's disease (HCC) Progressive. Continue on Sinemet 25-100 mg Tablet. Continue to assist with ADL's.   2. Dementia, without behavioral disturbance No new behavioral changes. Continue to assist with ADL's. Fall and safety precaution. Continue Aricept 10 mg Tablet.Encourage oral intake.   3. Dysphagia No aspiration episodes reported. Continue Aspiration precautions.   4. Vitamin D deficiency Continue on Vitamin D 1000 units daily.     Family/ staff Communication: Reviewed plan of care with patient's wife and facility Nurse supervisor.   Labs/tests ordered: None

## 2015-11-25 ENCOUNTER — Non-Acute Institutional Stay (SKILLED_NURSING_FACILITY): Payer: Medicare Other | Admitting: Internal Medicine

## 2015-11-25 ENCOUNTER — Encounter: Payer: Self-pay | Admitting: Internal Medicine

## 2015-11-25 DIAGNOSIS — K219 Gastro-esophageal reflux disease without esophagitis: Secondary | ICD-10-CM

## 2015-11-25 DIAGNOSIS — E538 Deficiency of other specified B group vitamins: Secondary | ICD-10-CM | POA: Diagnosis not present

## 2015-11-25 DIAGNOSIS — G20 Parkinson's disease: Secondary | ICD-10-CM

## 2015-11-25 DIAGNOSIS — E46 Unspecified protein-calorie malnutrition: Secondary | ICD-10-CM

## 2015-11-25 DIAGNOSIS — F0280 Dementia in other diseases classified elsewhere without behavioral disturbance: Secondary | ICD-10-CM | POA: Diagnosis not present

## 2015-11-25 DIAGNOSIS — J9611 Chronic respiratory failure with hypoxia: Secondary | ICD-10-CM

## 2015-11-25 DIAGNOSIS — R32 Unspecified urinary incontinence: Secondary | ICD-10-CM

## 2015-11-25 DIAGNOSIS — R627 Adult failure to thrive: Secondary | ICD-10-CM

## 2015-11-25 NOTE — Progress Notes (Signed)
[facility]  PCP: [name], MD   Code Status: DNR  Goals of care: Advanced Directive information Advanced Directives 10/28/2015  Does patient have an advance directive? Yes  Type of Advance Directive Out of facility DNR (pink MOST or yellow form)  Does patient want to make changes to advanced directive? No - Patient declined  Copy of advanced directive(s) in chart? Yes       Extended Emergency Contact Information Primary Emergency Contact: Johnson,[name] Address: [phone]          [place] [id number] [place] Home Phone: [phone] Relation: None Secondary Emergency Contact: [name] Address: [address]          [place], [place] [address] [name]: [phone] Relation: Son   No Known Allergies  Chief Complaint  Patient presents with  . Medical Management of Chronic Issues    Routine Visit     HPI:  Patient is a 82 y.o. male seen today for routine visit. He is a long term care resident here. He is seen in his room with his wife at bedside. He has been at his baseline. He needs assistance with his ADLs. He is on o2 with breathing being stable. he participates minimally in HPI and ROS. His family would like his b12 resumed that he was taking at home. No fall reported. No new skin concern. No acute behavior change reported.   Review of Systems: Unable to obtain from patient    Past Medical History  Diagnosis Date  . 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 Unspecified transient cerebral ischemia   . Hallucinations   . Other vitamin B12 deficiency anemia   . Paralysis agitans (HCC)   . Parkinson's disease (HCC) 03/27/2013    Medications:   Medication List       This list is accurate as of: 11/25/15  1:58 PM.  Always use your most recent med list.               aspirin 325 MG tablet  Take 325 mg by mouth daily.     carbidopa-levodopa  25-100 MG tablet  Commonly known as:  SINEMET IR  1.5 tablets by mouth three times daily. Dosing times : 1 hour before meals or 2 hours after meals.     donepezil 10 MG tablet  Commonly known as:  ARICEPT  Take 1 tablet by mouth at  bedtime     fish oil-omega-3 fatty acids 1000 MG capsule  Take 1,000 mg by mouth daily.     Maltodextrin-Xanthan Gum Powd  Take 1 g by mouth daily as needed. High Risk of Aspiration     multivitamin-lutein Caps capsule  Take 1 capsule by mouth daily.     omeprazole 20 MG capsule  Commonly known as:  PRILOSEC  Take 20 mg by mouth daily.     oxybutynin 5 MG tablet  Commonly known as:  DITROPAN  Take 1 tablet by mouth  nightly     OXYGEN  Inhale 3 L into the lungs. 3 Lpm     TYLENOL 500 MG tablet  Generic drug:  acetaminophen  Take 500 mg by mouth every 6 (six) hours as needed for moderate pain.     Vitamin D-3 1000 units Caps  Take 1 capsule by mouth daily.        Immunizations: Immunization History  Administered Date(s) Administered  . PPD Test 09/05/2015,  09/20/2015     Physical Exam: Filed Vitals:   11/25/15 1350  BP: 125/59  Pulse: 77  Temp: 98.2 F (36.8 C)  TempSrc: Oral  Resp: 19  Height: 5\' 2"  (1.575 m)  Weight: 152 lb 6.4 oz (69.128 kg)   Body mass index is 27.87 kg/(m^2).  General- elderly male, frail, in no acute distress Head- normocephalic, atraumatic Nose- no nasal discharge Throat- moist mucus membrane Eyes-  no pallor, no icterus, no discharge Neck- no cervical lymphadenopathy Cardiovascular- normal s1,s2, no murmur Respiratory- poor air movement, no wheeze, no rhonchi, no crackles, no use of accessory muscles, on o2 by nasal canula Abdomen- bowel sounds present, soft, non tender Musculoskeletal- able to move all 4 extremities, generalized weakness, trace leg edema, on wheelchair Neurological- pleasantly confused Skin- warm and dry    Labs reviewed: Basic Metabolic Panel:  Recent Labs   09/02/15 0455 09/03/15 0514 09/04/15 0513 09/05/15 09/05/15 0539 09/08/15  NA 143 144 139 142 142 141  K 4.3 4.6 4.2  --  4.0 4.6  CL 112* 111 106  --  108  --   CO2 24 24 22   --  26  --   GLUCOSE 100* 109* 84  --  106*  --   BUN 24* 18 15 14 14 14   CREATININE 1.05 0.84 0.84 0.8 0.83 0.9  CALCIUM 9.1 10.1 9.3  --  9.8  --   MG 1.9  --   --   --   --   --    Liver Function Tests:  Recent Labs  08/30/15 0450 08/31/15 2013 09/08/15  AST 20 33 38  ALT 14* 7* 27  ALKPHOS 54 61 69  BILITOT 0.9 0.6  --   PROT 6.0* 6.1*  --   ALBUMIN 3.3* 3.2*  --    No results for input(s): LIPASE, AMYLASE in the last 8760 hours.  Recent Labs  08/29/15 2041 08/31/15 0459 09/03/15 1146  AMMONIA 137* 26 26   CBC:  Recent Labs  08/30/15 0450 08/31/15 2013  09/03/15 0514 09/04/15 0513 09/05/15 09/05/15 0539 09/08/15  WBC 8.5 16.0*  < > 7.5 6.0 5.2 5.2 7.3  NEUTROABS 5.5 14.2*  --   --   --   --   --   --   HGB 11.6* 11.4*  < > 10.8* 10.6*  --  10.6* 11.9*  HCT 35.3* 34.2*  < > 33.1* 32.4*  --  31.9* 38*  MCV 91.2 91.0  < > 92.7 91.5  --  89.9  --   PLT 251 215  < > 175 199  --  230 375  < > = values in this interval not displayed.     Assessment/Plan  gerd Stable, continue omeprazole 20 mg daily  Failure to thrive Continue supportive care. Continue palliative care.   Parkinson's dementia With Parkinson's disease. Continue sinemet 25-100 mg 1.5 tab tid and aricept and monitor. Continue supportive care  UI Continue ditropan nightly and monitor  Respiratory failure Currently on 3l o2 by nasal canula. High aspiration risk, take aspiration precautions. Wean o2 to room air if tolerated. Was on o2 for his aspiration pneumonia  Protein calorie malnutrition Continue fortified meals, mechanical soft diet   b12 deficiency Check b12 level and if on lower side of normal or low, start treatment. Check cbc   Family/ staff Communication: reviewed care plan with patient's wife and  nursing supervisor    Oneal Grout, MD Internal Medicine [facility] [address] [place], [place] [address] Cell Phone (Monday-Friday 8 am - 5 pm): [phone] On Call: [phone] and follow prompts after 5 pm and on weekends Office Phone: [phone] Office Fax: [phone]

## 2015-12-14 ENCOUNTER — Non-Acute Institutional Stay (SKILLED_NURSING_FACILITY): Payer: Medicare Other | Admitting: Family

## 2015-12-14 DIAGNOSIS — L210 Seborrhea capitis: Secondary | ICD-10-CM

## 2015-12-14 DIAGNOSIS — L219 Seborrheic dermatitis, unspecified: Secondary | ICD-10-CM

## 2015-12-14 MED ORDER — KETOCONAZOLE 2 % EX SHAM: MEDICATED_SHAMPOO | CUTANEOUS | Status: DC

## 2015-12-14 NOTE — Progress Notes (Signed)
Location:   [facility]    Place of Service:  SNF [phone]) Provider:  Dinah Ngetich FNP-C   Lupe Carney, MD  Patient Care Team: L.Lupe Carney, MD as PCP - General (Family Medicine)  Extended Emergency Contact Information Primary Emergency Contact: Scarpati,[name] Address: [phone]          [place] [address] [name] Home Phone: [phone] Relation: None Secondary Emergency Contact: [facility] Address: [address]          [place], [place] [address] [name] Home Phone: [phone] Relation: Son  Code Status:  DNR  Goals of care: Advanced Directive information Advanced Directives 10/28/2015  Does patient have an advance directive? Yes  Type of Advance Directive Out of facility DNR (pink MOST or yellow form)  Does patient want to make changes to advanced directive? No - Patient declined  Copy of advanced directive(s) in chart? Yes  Would patient like information on creating an advanced directive? -     Chief Complaint  Patient presents with  . Acute Visit    HPI:  Pt is a 82 y.o. male seen today at [facility] for an acute visit for evaluation of worsening dandruff. He is seen in his room today with wife at bedside. She states patient has been doing well except for worsening dandruff on the head. She reports patient scratches head occasionally. He denies any acute issues. He is more awake and alert this visit compared to previous visit.Afebrile.    Past Medical History:  Diagnosis Date  . Hallucinations   . 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 Other vitamin B12 deficiency anemia   . Paralysis agitans (HCC)   . Parkinson's disease (HCC) 03/27/2013  . Unspecified transient cerebral ischemia    Past Surgical History:  Procedure Laterality Date  . APPENDECTOMY    . CHOLECYSTECTOMY    . PTCA  1987  . TRANSURETHRAL RESECTION OF PROSTATE      No  Known Allergies    Medication List       Accurate as of 12/14/15  3:10 PM. Always use your most recent med list.          aspirin 325 MG tablet Take 325 mg by mouth daily.   carbidopa-levodopa 25-100 MG tablet Commonly known as:  SINEMET IR 1.5 tablets by mouth three times daily. Dosing times : 1 hour before meals or 2 hours after meals.   donepezil 10 MG tablet Commonly known as:  ARICEPT Take 1 tablet by mouth at  bedtime   fish oil-omega-3 fatty acids 1000 MG capsule Take 1,000 mg by mouth daily.   Maltodextrin-Xanthan Gum Powd Take 1 g by mouth daily as needed. High Risk of Aspiration   multivitamin-lutein Caps capsule Take 1 capsule by mouth daily.   omeprazole 20 MG capsule Commonly known as:  PRILOSEC Take 20 mg by mouth daily.   oxybutynin 5 MG tablet Commonly known as:  DITROPAN Take 1 tablet by mouth  nightly   OXYGEN Inhale 3 L into the lungs. 3 Lpm   TYLENOL 500 MG tablet Generic drug:  acetaminophen Take 500 mg by mouth every 6 (six) hours as needed for moderate pain.   Vitamin D-3 1000 units Caps Take 1 capsule by mouth daily.       Review of Systems  Unable to perform ROS: Dementia    Immunization History  Administered Date(s) Administered  .  PPD Test 09/05/2015, 09/20/2015   Pertinent  Health Maintenance Due  Topic Date Due  . INFLUENZA VACCINE  02/13/2016 (Originally 12/14/2015)  . PNA vac Low Risk Adult (1 of 2 - PCV13) 05/15/2016 (Originally 08/28/1994)      Vitals:   12/14/15 1201  BP: (!) 149/67  Pulse: 60  Resp: 20  Temp: 98 F (36.7 C)  SpO2: 95%  Weight: 146 lb 3.2 oz (66.3 kg)  Height: 5\' 2"  (1.575 m)   Body mass index is 26.74 kg/m. Physical Exam  Constitutional: He appears well-developed and well-nourished. No distress.  Pleasantly confused at baseline  HENT:  Head: Normocephalic.  Mouth/Throat: Oropharynx is clear and moist.  Eyes: Conjunctivae and EOM are normal. Pupils are equal, round, and reactive to  light. Right eye exhibits no discharge. No scleral icterus.  Neck: Neck supple. No JVD present. No thyromegaly present.  Cardiovascular: Normal rate, regular rhythm, normal heart sounds and intact distal pulses.  Exam reveals no gallop and no friction rub.   No murmur heard. Pulmonary/Chest: Effort normal and breath sounds normal. No respiratory distress. He has no wheezes. He has no rales.  Abdominal: Soft. Bowel sounds are normal. He exhibits no distension. There is no tenderness. There is no rebound and no guarding.  Lymphadenopathy:    He has no cervical adenopathy.  Neurological: He is alert.  Confused at baseline.   Skin: Skin is warm and dry. No rash noted. No erythema. No pallor.  White greasy scaly patches on crown of the head.   Psychiatric: He has a normal mood and affect.    Labs reviewed:  Recent Labs  09/02/15 0455 09/03/15 0514 09/04/15 0513 09/05/15 09/05/15 0539 09/08/15  NA 143 144 139 142 142 141  K 4.3 4.6 4.2  --  4.0 4.6  CL 112* 111 106  --  108  --   CO2 24 24 22   --  26  --   GLUCOSE 100* 109* 84  --  106*  --   BUN 24* 18 15 14 14 14   CREATININE 1.05 0.84 0.84 0.8 0.83 0.9  CALCIUM 9.1 10.1 9.3  --  9.8  --   MG 1.9  --   --   --   --   --     Recent Labs  08/30/15 0450 08/31/15 2013 09/08/15  AST 20 33 38  ALT 14* 7* 27  ALKPHOS 54 61 69  BILITOT 0.9 0.6  --   PROT 6.0* 6.1*  --   ALBUMIN 3.3* 3.2*  --     Recent Labs  08/30/15 0450 08/31/15 2013  09/03/15 0514 09/04/15 0513 09/05/15 09/05/15 0539 09/08/15  WBC 8.5 16.0*  < > 7.5 6.0 5.2 5.2 7.3  NEUTROABS 5.5 14.2*  --   --   --   --   --   --   HGB 11.6* 11.4*  < > 10.8* 10.6*  --  10.6* 11.9*  HCT 35.3* 34.2*  < > 33.1* 32.4*  --  31.9* 38*  MCV 91.2 91.0  < > 92.7 91.5  --  89.9  --   PLT 251 215  < > 175 199  --  230 375  < > = values in this interval not displayed. Lab Results  Component Value Date   TSH 2.336 08/30/2015   No results found for: HGBA1C No results found  for: CHOL, HDL, LDLCALC, LDLDIRECT, TRIG, CHOLHDL  Assessment/Plan  Pityriasis capitis White greasy scaly patches on crown of  the head and eyebrows. Start Ketoconazole topical 2 % Shampoo apply to affected areas on the head twice per week with shower.     Family/ staff Communication:Reviewed plan of care with patient's wife, patient and facility Nurse supervisor.   Labs/tests ordered: None

## 2015-12-31 ENCOUNTER — Non-Acute Institutional Stay (SKILLED_NURSING_FACILITY): Payer: Medicare Other | Admitting: Family

## 2015-12-31 ENCOUNTER — Encounter: Payer: Self-pay | Admitting: Family

## 2015-12-31 DIAGNOSIS — R059 Cough, unspecified: Secondary | ICD-10-CM

## 2015-12-31 DIAGNOSIS — R05 Cough: Secondary | ICD-10-CM | POA: Diagnosis not present

## 2015-12-31 DIAGNOSIS — G20 Parkinson's disease: Secondary | ICD-10-CM

## 2015-12-31 DIAGNOSIS — M6289 Other specified disorders of muscle: Secondary | ICD-10-CM

## 2015-12-31 DIAGNOSIS — R29898 Other symptoms and signs involving the musculoskeletal system: Secondary | ICD-10-CM

## 2015-12-31 DIAGNOSIS — K219 Gastro-esophageal reflux disease without esophagitis: Secondary | ICD-10-CM | POA: Diagnosis not present

## 2015-12-31 DIAGNOSIS — N400 Enlarged prostate without lower urinary tract symptoms: Secondary | ICD-10-CM | POA: Insufficient documentation

## 2015-12-31 NOTE — Progress Notes (Signed)
Location:  [facility] Room Number: 1006 A Place of Service:  SNF (31) Provider:  Richarda Blade, FNP-C  Lupe Carney, MD  Patient Care Team: L.Lupe Carney, MD as PCP - General (Family Medicine)  Extended Emergency Contact Information Primary Emergency Contact: Hazelip,[name] Address: [phone]          [place] [address] [name] Home Phone: [phone] Relation: None Secondary Emergency Contact: [facility] Address: [address]          [place], [place] [address] [name] of [place] Home Phone: [phone] Relation: Son  Code Status:  DNR Goals of care: Advanced Directive information Advanced Directives 12/31/2015  Does patient have an advance directive? Yes  Type of Advance Directive Out of facility DNR (pink MOST or yellow form)  Does patient want to make changes to advanced directive? -  Copy of advanced directive(s) in chart? Yes  Would patient like information on creating an advanced directive? -     Chief Complaint  Patient presents with  . Medical Management of Chronic Issues    Routine Visit    HPI:  Pt is a 82 y.o. male seen today at [facility] for medical management of chronic diseases.He has a medical history of Parkinson's disease, Dementia, BPH,  CVA among other conditions. He is seen in his room today with wife at bedside. She states he has had worsening left hand weakness and numbness and unable to strengthen fingers.She noticed left hand weakness early during the week. She states speech impairment at baseline for patient and no changes in swallowing. Patient's wife also states worsening cough. She denies any audible wheezing or shortness of breath. Unable to obtain HPI and ROS due to patient's cognitive impairment.   Past Medical History:  Diagnosis Date  . Hallucinations   . 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 Other vitamin B12  deficiency anemia   . Paralysis agitans (HCC)   . Parkinson's disease (HCC) 03/27/2013  . Unspecified transient cerebral ischemia    Past Surgical History:  Procedure Laterality Date  . APPENDECTOMY    . CHOLECYSTECTOMY    . PTCA  1987  . TRANSURETHRAL RESECTION OF PROSTATE      No Known Allergies    Medication List       Accurate as of 12/31/15  1:44 PM. Always use your most recent med list.          aspirin 325 MG tablet Take 325 mg by mouth daily.   carbidopa-levodopa 25-100 MG tablet Commonly known as:  SINEMET IR 1.5 tablets by mouth three times daily. Dosing times : 1 hour before meals or 2 hours after meals.   donepezil 10 MG tablet Commonly known as:  ARICEPT Take 1 tablet by mouth at  bedtime   fish oil-omega-3 fatty acids 1000 MG capsule Take 1,000 mg by mouth daily.   Maltodextrin-Xanthan Gum Powd Take 1 g by mouth daily as needed. High Risk of Aspiration   multivitamin-lutein Caps capsule Take 1 capsule by mouth daily.   omeprazole 20 MG capsule Commonly known as:  PRILOSEC Take 20 mg by mouth daily.   oxybutynin 5 MG tablet Commonly known as:  DITROPAN Take 1 tablet by mouth  nightly   OXYGEN Inhale 3 L into the lungs. 3 Lpm   TYLENOL 500 MG tablet Generic drug:  acetaminophen Take 500 mg by mouth every 6 (six) hours as needed for  moderate pain.   Vitamin D-3 1000 units Caps Take 1 capsule by mouth daily.       Review of Systems  Unable to perform ROS: Dementia    Immunization History  Administered Date(s) Administered  . PPD Test 09/05/2015, 09/20/2015   Pertinent  Health Maintenance Due  Topic Date Due  . INFLUENZA VACCINE  02/13/2016 (Originally 12/14/2015)  . PNA vac Low Risk Adult (1 of 2 - PCV13) 05/15/2016 (Originally 08/28/1994)   Fall Risk  12/31/2015  Falls in the past year? Exclusion - non ambulatory      Vitals:   12/31/15 1333  BP: 140/69  Pulse: 69  Resp: 19  Temp: 97.3 F (36.3 C)  Weight: 146 lb (66.2  kg)  Height: 5\' 2"  (1.575 m)   Body mass index is 26.7 kg/m. Physical Exam  Constitutional: He appears well-developed and well-nourished. No distress.  Pleasantly confused at baseline  HENT:  Head: Normocephalic.  Mouth/Throat: Oropharynx is clear and moist.  Eyes: Conjunctivae and EOM are normal. Pupils are equal, round, and reactive to light. Right eye exhibits no discharge. No scleral icterus.  Neck: Neck supple. No JVD present. No thyromegaly present.  Cardiovascular: Normal rate, regular rhythm, normal heart sounds and intact distal pulses.  Exam reveals no gallop and no friction rub.   No murmur heard. Pulmonary/Chest: Effort normal. No respiratory distress. He has no wheezes.  Bilateral rales to auscultation.    Abdominal: Soft. Bowel sounds are normal. He exhibits no distension. There is no tenderness. There is no rebound and no guarding.  Genitourinary:  Genitourinary Comments: Incontinent for both B/B   Musculoskeletal: He exhibits no edema, tenderness or deformity.  Left hand weakness with limited active ROM  Lymphadenopathy:    He has no cervical adenopathy.  Neurological: He is alert.  Confused at baseline.   Skin: Skin is warm and dry. No rash noted. No erythema. No pallor.     Psychiatric: He has a normal mood and affect.    Labs reviewed:  Recent Labs  09/02/15 0455 09/03/15 0514 09/04/15 0513 09/05/15 09/05/15 0539 09/08/15  NA 143 144 139 142 142 141  K 4.3 4.6 4.2  --  4.0 4.6  CL 112* 111 106  --  108  --   CO2 24 24 22   --  26  --   GLUCOSE 100* 109* 84  --  106*  --   BUN 24* 18 15 14 14 14   CREATININE 1.05 0.84 0.84 0.8 0.83 0.9  CALCIUM 9.1 10.1 9.3  --  9.8  --   MG 1.9  --   --   --   --   --     Recent Labs  08/30/15 0450 08/31/15 2013 09/08/15  AST 20 33 38  ALT 14* 7* 27  ALKPHOS 54 61 69  BILITOT 0.9 0.6  --   PROT 6.0* 6.1*  --   ALBUMIN 3.3* 3.2*  --     Recent Labs  08/30/15 0450 08/31/15 2013  09/03/15 0514  09/04/15 0513 09/05/15 09/05/15 0539 09/08/15  WBC 8.5 16.0*  < > 7.5 6.0 5.2 5.2 7.3  NEUTROABS 5.5 14.2*  --   --   --   --   --   --   HGB 11.6* 11.4*  < > 10.8* 10.6*  --  10.6* 11.9*  HCT 35.3* 34.2*  < > 33.1* 32.4*  --  31.9* 38*  MCV 91.2 91.0  < > 92.7 91.5  --  89.9  --   PLT 251 215  < > 175 199  --  230 375  < > = values in this interval not displayed. Lab Results  Component Value Date   TSH 2.336 08/30/2015   Assessment/Plan Cough  Afebrile. Non-productive. Obtain portable CXR PA/Lat rule out PNA   Left Hand weakness Worsening left hand weakness unable to straighten fingers. Refer to Neurology for evaluation could be possible Parkinson's disease but unlikely CVA given patient hand bends from wrist area only without any other signs of CVA. Facility OT to evaluate.   Parkinson's disease  Continue sinemet. Continue to follow up with Neurology.   BPH Continue on oxybutynin.   GERD  Continue on omeprazole.   Family/ staff Communication: Reviewed plan of care with patient's wife, patient and facility Nurse supervisor.  Labs/tests ordered: None

## 2016-01-04 ENCOUNTER — Encounter: Payer: Self-pay | Admitting: Neurology

## 2016-01-04 ENCOUNTER — Ambulatory Visit (INDEPENDENT_AMBULATORY_CARE_PROVIDER_SITE_OTHER): Payer: Medicare Other | Admitting: Neurology

## 2016-01-04 VITALS — BP 118/64 | HR 78 | Resp 20

## 2016-01-04 DIAGNOSIS — M21332 Wrist drop, left wrist: Secondary | ICD-10-CM

## 2016-01-04 DIAGNOSIS — F0390 Unspecified dementia without behavioral disturbance: Secondary | ICD-10-CM

## 2016-01-04 DIAGNOSIS — G20 Parkinson's disease: Secondary | ICD-10-CM

## 2016-01-04 NOTE — Progress Notes (Signed)
Subjective:    Patient ID: Michael [name] is a 82 y.o. male.  HPI    Interim history:   Michael [name] is a very pleasant 82 year old right-handed gentleman with an underlying medical history of vitamin B12 deficiency and chronic back pain, s/p epidural injections in the past, and angioplasty in 1987, prostate surgery and bilateral cataract surgeries, who presents for followup consultation of his right-sided predominant Parkinson's disease, complicated by chronic back pain, dementia, hallucinations and urinary incontinence, overall frailty, deconditioning and the challanges of advancing age. He is accompanied by his wife and son today. I last saw him on 08/27/2014, at which time granddaughter was helping out. He was no longer driving. He had a daughter in [place]. He was having good days and bad days he stated.  Today, 01/04/2016: He reports very little, is in a WC and has O2 in place. He is minimally verbal and is not able to follow commands by verbal instructions only but is able to mimic. His wife reports that he has had trouble with his left hand for the past couple weeks, has noted a wrist drop on the left, maybe for the past 1 or 2 weeks, he has noticed difficulty with his left arm and hand. No left leg weakness, is minimally mobile at this time, mostly wheelchair or bedbound, son reports that when he was hospitalized after the fall in April 2017 he became much more deconditioned and weak are globally, hospital course complicated by UTI and pneumonia. He is supposed to start physical therapy.  He was seen in the interim by Ward Givens on 06/16/2015, at which time his wife reported that he was having progressive left-sided weakness. This was gradual. On examination he was noted to lean to the left. He was referred to home health physical therapy. His Sinemet was increased to 1-1/2 pills 3 times a day.   Previously:  I saw him on 12/04/2013, at which time his wife reported a recent fall where he  bruised his left rib cage. No other injuries were reported. He slid off his chair and landed on his buttock. His MMSE was 16/30 in July 2015. I did not make any medication changes and kept him on Sinemet, Aricept and ditropan.    I saw him on 08/05/2013, at which time we talked about his advanced PD and its complications but overall he seems to be fairly stable. We talked about his medications. He reported feeling well overall. I noted that he was taking 2 different bladder medications. We talked about his sialorrhea and consideration for a myobloc injections for this down the [place]. He was supposed to get new dentures, but they don't have the money.    I saw him on 03/27/2013, at which time I suggested a trial of Ditropan for his urinary incontinence. I kept his other medications the same.     I first met him on 11/25/2012, and which time I did not change his Sinemet dose but increased his Aricept to 10 mg once daily. I suggested a urology referral but they declined. He could not afford the Vesicare and they requested an alternative. She felt, the increase in Aricept helped and he was able to tolerate it.   He previously followed with Dr. Morene Antu and was last seen by him on 07/18/2012, at which time Dr. Erling Cruz felt that he most likely has Parkinson's disease versus Lewy body dementia. He started Aricept 5 mg daily. He felt that we may need to cut down his  levodopa.     He has a family history of Parkinson's disease in his sister who passed away at the age of 77. His daughter was diagnosed with PD in 3/14 at the age of 86. He was diagnosed with Parkinson's disease in May 2008 and was initially placed on Requip but developed hallucinations and was switched to Sinemet in September 2008. His symptoms may date back to 2006. He had postural instability and fell in April 2010 fracturing 2 bones in his right wrist and two bones in his left thumb. He had sudden onset of left arm tingling and weakness and was seen  at [facility] but was not felt to be a TPA candidate. CT of the head showed chronic small vessel disease. MRI brain showed normal MRI for age. EKG showed first degree AV block. Doppler study of the carotids was normal. Echocardiogram showed an EF of 60-65%. He has had hallucinations and memory loss. In May 2013 his MMSE was 17/30, CDT was 2/4, AFT was 14/minute. In March 2014 his MMSE was 20/30, CDT was 3/4, AFT was 6/minute.  His Past Medical History Is Significant For: Past Medical History:  Diagnosis Date  . Hallucinations   . 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 Other vitamin B12 deficiency anemia   . Paralysis agitans ([place])   . Parkinson's disease ([place]) 03/27/2013  . Unspecified transient cerebral ischemia     His Past Surgical History Is Significant For: Past Surgical History:  Procedure Laterality Date  . APPENDECTOMY    . CHOLECYSTECTOMY    . PTCA  1987  . TRANSURETHRAL RESECTION OF PROSTATE      His Family History Is Significant For: Family History  Problem Relation Age of Onset  . Parkinson's disease Sister   . Cancer Brother   . Parkinson's disease Daughter 46    His Social History Is Significant For: Social History   Social History  . Marital status: Married    Spouse name: N/A  . Number of children: N/A  . Years of education: N/A   Social History Main Topics  . Smoking status: Never Smoker  . Smokeless tobacco: Never Used  . Alcohol use No  . Drug use: No  . Sexual activity: Not Asked   Other Topics Concern  . None   Social History Narrative  . None    His Allergies Are:  No Known Allergies:   His Current Medications Are:  Outpatient Encounter Prescriptions as of 01/04/2016  Medication Sig  . acetaminophen (TYLENOL) 500 MG tablet Take 500 mg by mouth every 6 (six) hours as needed for moderate pain.   Marland Kitchen aspirin 325 MG tablet Take 325 mg by mouth daily.   . carbidopa-levodopa (SINEMET IR) 25-100 MG  tablet 1.5 tablets by mouth three times daily. Dosing times : 1 hour before meals or 2 hours after meals.  . Cholecalciferol (VITAMIN D-3) 1000 UNITS CAPS Take 1 capsule by mouth daily.   Marland Kitchen donepezil (ARICEPT) 10 MG tablet Take 1 tablet by mouth at  bedtime  . fish oil-omega-3 fatty acids 1000 MG capsule Take 1,000 mg by mouth daily.   . multivitamin-lutein (OCUVITE-LUTEIN) CAPS capsule Take 1 capsule by mouth daily.  Marland Kitchen omeprazole (PRILOSEC) 20 MG capsule Take 20 mg by mouth daily.  Marland Kitchen oxybutynin (DITROPAN) 5 MG tablet Take 1 tablet by mouth  nightly  . OXYGEN Inhale 3 L into the lungs. 3 Lpm  . [DISCONTINUED] Maltodextrin-Xanthan Gum POWD Take 1  g by mouth daily as needed. High Risk of Aspiration   No facility-administered encounter medications on file as of 01/04/2016.   :  Review of Systems:  Out of a complete 14 point review of systems, all are reviewed and negative with the exception of these symptoms as listed below:  Review of Systems  Neurological:       Son reports that patient has had some L arm and hand weakness for past "couple of weeks".     Objective:  Neurologic Exam  Physical Exam Physical Examination:   Vitals:   01/04/16 1048  BP: 118/64  Pulse: 78  Resp: 20   General Examination: The patient is a very pleasant 82 y.o. male in no acute distress.he is frail and deconditioned appearing, has oxygen in place via nasal cannula, situated in his wheelchair.  HEENT: Normocephalic, atraumatic, pupils are equal, round and reactive to light and accommodation. Extraocular tracking shows difficulty tracking. He has moderate facial masking, minimally verbal, no tremor and head and neck area, no drooling,oropharynx exam shows mild to moderate mouth dryness, tongue movements are unremarkable, symmetrical.  Chest: is clear to auscultation without wheezing, rhonchi or crackles noted.  Heart: sounds are regular and normal without murmurs, rubs or gallops noted.   Abdomen: is  soft, non-tender and non-distended with normal bowel sounds appreciated on auscultation.  Extremities: There is no pitting edema in the distal lower extremities bilaterally. There are no varicose veins. He is missing the tip of his R index finger.  Skin: is warm and dry with no trophic changes noted. Age-related changes are noted on the skin.   Musculoskeletal: exam reveals no obvious joint deformities, tenderness, joint swelling or erythema.  Neurologically:  Mental status: The patient is awake and alert, paying very little attention, seems sleepy, seems to doze off briefly a few times, wife and son provide his history, he is not able to follow verbal commands veryalso hard of hearing,is able to mimic simple one-step comma  His memory, attention, language and knowledge are impaired. There is no aphasia, agnosia, apraxia or anomia. There is a moderate degree of bradyphrenia. Speech is moderately to severely hypophonic with mild dysarthria noted. Mood is congruent and affect seems flat.    On 12/04/2013: MMSE was 16/30, AFT 11, CDT 1/4.   On 08/27/2014: MMSE: 12/30, CDT: 1/4, AFT: 11/min, GDS: 4/15.  Cranial nerves are as described above under HEENT exam. In addition, shoulder shrug is normal with unequal shoulder height noted.  Motor exam: Normal bulk, and mild global weakness in keeping with deconditioning is noted. No dyskinesias are noted, he has moderate bradykinesia. He has no resting tremor. He has left wrist drop. Tone is mildly rigid with presence of cogwheeling in the right upper extremity. There is overall moderate bradykinesia. There is no drift or rebound.  There is no resting tremor. Reflexes are 1+ in the upper extremities and trace in the lower extremities.   Fine motor skills exam: globally significantly impaired. No significant lateralization is noted today.  Cerebellar testing shows no dysmetria or intention tremor on finger to nose testing. There is no truncal or gait ataxia.    Sensory exam is intact to light touch in the UEs and LEs.   Gait, station and balance: He Is not able to stand or walk for me.  Assessment and Plan:   In summary, Michael [name] is a very pleasant 82 year old male with a history of Parkinson's disease, right-sided predominant, complicated by chronic back pain, dementia,  hallucinations, significant posture changes, significant sialorrhea, urinary incontinence, and urinary incontinence. He has advanced PD, and has progressed motor-wise and also his memory scores have taken a decline., overall, compared to April 2016 when I last saw him he has declined significantly. Now also he has evidence of left wrist drop, not sure if he fell, not sure if he had an x-Michael, doubtful that this was secondary to stroke, nevertheless, I would like to proceed with a head CT without contrast, we will consider a EMG and nerve conduction tests to look for radial neuropathy, he may need an x-Michael of his left wrist. I would recommend physical therapy and splinting of his left wrist, we will call his family with his head CT results and consider EMG and nerve conduction testing ext, wife is reluctant to proceed with any invasive or painful tests however. I would recommend as needed follow-up, he can have physical therapy at his nursing home facility which I recommended today and from what I understand he is supposed to start physical therapy soon. I spent 25 minutes in total face-to-face time with the patient, more than 50% of which was spent in counseling and coordination of care, reviewing test results, reviewing medication and discussing or reviewing the diagnosis of PD in the advanced stage and with advancing age, the prognosis and treatment options.

## 2016-01-04 NOTE — Patient Instructions (Signed)
We will do PT for your L hand and wrist weakness.  I will do a CT head without contrast to rule out stroke.  We will consider an EMG and nerve conduction velocity test, which is an electrical nerve and muscle test.

## 2016-02-01 ENCOUNTER — Non-Acute Institutional Stay (SKILLED_NURSING_FACILITY): Payer: Medicare Other | Admitting: Family

## 2016-02-01 DIAGNOSIS — F0390 Unspecified dementia without behavioral disturbance: Secondary | ICD-10-CM

## 2016-02-01 DIAGNOSIS — G20 Parkinson's disease: Secondary | ICD-10-CM

## 2016-02-01 DIAGNOSIS — R1312 Dysphagia, oropharyngeal phase: Secondary | ICD-10-CM | POA: Diagnosis not present

## 2016-02-01 NOTE — Progress Notes (Signed)
Location:  [facility] Room Number: 1006 Place of Service:  SNF (31) Provider:  Richarda Blade, FNP-C   Lupe Carney, MD  Patient Care Team: L.Lupe Carney, MD as PCP - General (Family Medicine)  Extended Emergency Contact Information Primary Emergency Contact: Bushey,[name] Address: [phone]          [place] [address] [name] Home Phone: [phone] Relation: None Secondary Emergency Contact: [facility] Address: [address]          [place], [place] [address] [name] Home Phone: [phone] Relation: Son  Code Status:  DNR Goals of care: Advanced Directive information Advanced Directives 02/01/2016  Does patient have an advance directive? Yes  Type of Advance Directive Out of facility DNR (pink MOST or yellow form)  Does patient want to make changes to advanced directive? No - Patient declined  Copy of advanced directive(s) in chart? Yes  Would patient like information on creating an advanced directive? -  Pre-existing out of facility DNR order (yellow form or pink MOST form) Yellow form placed in chart (order not valid for inpatient use)     Chief Complaint  Patient presents with  . Medical Management of Chronic Issues    Medical Management of Chronic issues    HPI:  Pt is a 82 y.o. male seen today at [facility]  for medical management of chronic diseases.He has a medical history of Parkinson's disease, Dementia, BPH dysphagia  Among other conditions.He is seen in his room today. He is unable to provide HPI and ROS due to dementia. Facility Nurse reports no recent weight changes, fall episodes or hospital admission. No skin break down. He continues to require total care assistance with ADL's.    Past Medical History:  Diagnosis Date  . Hallucinations   . 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 Other vitamin B12 deficiency anemia     . Paralysis agitans (HCC)   . Parkinson's disease (HCC) 03/27/2013  . Unspecified transient cerebral ischemia    Past Surgical History:  Procedure Laterality Date  . APPENDECTOMY    . CHOLECYSTECTOMY    . PTCA  1987  . TRANSURETHRAL RESECTION OF PROSTATE      No Known Allergies    Medication List       Accurate as of 02/01/16  3:19 PM. Always use your most recent med list.          AMBULATORY NON FORMULARY MEDICATION Magic Cup Sig: One cup by mouth every day with lunch meal for weight loss   aspirin 325 MG tablet Take 325 mg by mouth daily.   carbidopa-levodopa 25-100 MG tablet Commonly known as:  SINEMET IR 1.5 tablets by mouth three times daily. Dosing times : 1 hour before meals or 2 hours after meals.   donepezil 10 MG tablet Commonly known as:  ARICEPT Take 1 tablet by mouth at  bedtime   fish oil-omega-3 fatty acids 1000 MG capsule Take 1,000 mg by mouth daily.   ketoconazole 2 % shampoo Commonly known as:  NIZORAL Apply 1 application topically 2 (two) times a week.   MALTODEXTRIN-XANTHAN GUM PO Give 1gm by mouth as needed due to high risk for aspiration   multivitamin-lutein Caps capsule Take 1 capsule by mouth daily.   omeprazole 20 MG capsule Commonly known as:  PRILOSEC Take 20 mg by mouth daily.   oxybutynin 5 MG tablet Commonly known as:  DITROPAN Take 1 tablet by mouth  nightly   OXYGEN Inhale 3 L into the lungs. 3 Lpm   TYLENOL 500 MG tablet Generic drug:  acetaminophen Take 500 mg by mouth every 6 (six) hours as needed for moderate pain.   Vitamin D-3 1000 units Caps Take 1 capsule by mouth daily.       Review of Systems  Unable to perform ROS: Dementia    Immunization History  Administered Date(s) Administered  . PPD Test 09/05/2015, 09/20/2015   Pertinent  Health Maintenance Due  Topic Date Due  . INFLUENZA VACCINE  02/13/2016 (Originally 12/14/2015)  . PNA vac Low Risk Adult (1 of 2 - PCV13) 05/15/2016 (Originally  [birth year]28/1994)   Fall Risk  01/04/2016 12/31/2015  Falls in the past year? Yes Exclusion - non ambulatory  Number falls in past yr: 2 or more -  Injury with Fall? Yes -  Risk Factor Category  High Fall Risk -  Risk for fall due to : Impaired balance/gait;Impaired mobility -  Follow up Falls prevention discussed -    Vitals:   02/01/16 1518  BP: 130/69  Pulse: 74  Resp: 18  Temp: 97.2 F (36.2 C)  TempSrc: Oral  SpO2: 94%  Weight: 146 lb 12.8 oz (66.6 kg)  Height: 5\' 2"  (1.575 m)   Body mass index is 26.85 kg/m. Physical Exam  Constitutional: He appears well-developed and well-nourished. No distress.  Pleasantly confused at baseline  HENT:  Head: Normocephalic.  Mouth/Throat: Oropharynx is clear and moist.  Eyes: Conjunctivae and EOM are normal. Pupils are equal, round, and reactive to light. Right eye exhibits no discharge. No scleral icterus.  Neck: Neck supple. No JVD present. No thyromegaly present.  Cardiovascular: Normal rate, regular rhythm, normal heart sounds and intact distal pulses.  Exam reveals no gallop and no friction rub.   No murmur heard. Pulmonary/Chest: Effort normal and breath sounds normal. No respiratory distress. He has no wheezes. He has no rales.  Abdominal: Soft. Bowel sounds are normal. He exhibits no distension. There is no tenderness. There is no rebound and no guarding.  Genitourinary:  Genitourinary Comments: Incontinent for both bowel and bladder  Musculoskeletal: He exhibits no edema or tenderness.  Moves x 4 extremities. Bilateral lower extremities weakness.   Lymphadenopathy:    He has no cervical adenopathy.  Neurological: He is alert.  Confused at baseline.   Skin: Skin is warm and dry. No rash noted. No erythema. No pallor.  Psychiatric: He has a normal mood and affect.    Labs reviewed:  Recent Labs  09/02/15 0455 09/03/15 0514 09/04/15 0513 09/05/15 09/05/15 0539 09/08/15  NA 143 144 139 142 142 141  K 4.3 4.6 4.2  --  4.0  4.6  CL 112* 111 106  --  108  --   CO2 24 24 22   --  26  --   GLUCOSE 100* 109* 84  --  106*  --   BUN 24* 18 15 14 14 14   CREATININE 1.05 0.84 0.84 0.8 0.83 0.9  CALCIUM 9.1 10.1 9.3  --  9.8  --   MG 1.9  --   --   --   --   --     Recent Labs  08/30/15 0450 08/31/15 2013 09/08/15  AST 20 33 38  ALT 14* 7* 27  ALKPHOS 54 61 69  BILITOT 0.9 0.6  --   PROT 6.0* 6.1*  --   ALBUMIN 3.3* 3.2*  --  Recent Labs  08/30/15 0450 08/31/15 2013  09/03/15 0514 09/04/15 0513 09/05/15 09/05/15 0539 09/08/15  WBC 8.5 16.0*  < > 7.5 6.0 5.2 5.2 7.3  NEUTROABS 5.5 14.2*  --   --   --   --   --   --   HGB 11.6* 11.4*  < > 10.8* 10.6*  --  10.6* 11.9*  HCT 35.3* 34.2*  < > 33.1* 32.4*  --  31.9* 38*  MCV 91.2 91.0  < > 92.7 91.5  --  89.9  --   PLT 251 215  < > 175 199  --  230 375  < > = values in this interval not displayed. Lab Results  Component Value Date   TSH 2.336 08/30/2015   Assessment/Plan  1. Parkinson's disease (HCC) Progressive. Continue on Sinemet 25-100 mg Tablet. Continue to assist with ADL's.   2. Dysphagia No aspiration episodes reported. Continue Aspiration precautions.continue to follow up with facility [facility]   3. Dementia, without behavioral disturbance No new behavioral changes. Continue to assist with ADL's. Fall and safety precaution. Continue Aricept 10 mg Tablet.Encourage oral intake.    Family/ staff Communication:Reviewed plan of care with facility Nurse supervisor.   Labs/tests ordered:  None

## 2016-02-14 ENCOUNTER — Ambulatory Visit
Admission: RE | Admit: 2016-02-14 | Discharge: 2016-02-14 | Disposition: A | Discharge status: Home / Self Care | Payer: Medicare Other | Source: Ambulatory Visit | Attending: Neurology | Admitting: Neurology

## 2016-02-14 DIAGNOSIS — M21332 Wrist drop, left wrist: Secondary | ICD-10-CM

## 2016-02-14 DIAGNOSIS — F0390 Unspecified dementia without behavioral disturbance: Secondary | ICD-10-CM | POA: Diagnosis not present

## 2016-02-14 DIAGNOSIS — G20 Parkinson's disease: Secondary | ICD-10-CM | POA: Diagnosis not present

## 2016-02-15 ENCOUNTER — Telehealth: Payer: Self-pay

## 2016-02-15 NOTE — Telephone Encounter (Signed)
-----   Message from Huston FoleySaima Athar, MD sent at 02/15/2016  4:23 PM EDT ----- Head CT showed no new changes, no progression from the prior CT in April 2017. Please notify wife or family member.  Huston FoleySaima Athar, MD, PhD [facility]

## 2016-02-15 NOTE — Progress Notes (Signed)
Head CT showed no new changes, no progression from the prior CT in April 2017. Please notify wife or family member.  Huston FoleySaima Ishmeal Rorie, MD, PhD [facility])

## 2016-02-15 NOTE — Telephone Encounter (Signed)
I spoke to wife and she is aware of results.  

## 2016-02-22 ENCOUNTER — Non-Acute Institutional Stay (SKILLED_NURSING_FACILITY): Payer: Medicare Other | Admitting: Family

## 2016-02-22 DIAGNOSIS — R21 Rash and other nonspecific skin eruption: Secondary | ICD-10-CM

## 2016-02-22 NOTE — Progress Notes (Signed)
Location:  [facility] Room Number: 1006 A  Place of Service:  SNF (31) Provider:  Dinah Ngetich FNP-C   Lupe Carney, MD  Patient Care Team: L.Lupe Carney, MD as PCP - General (Family Medicine)  Extended Emergency Contact Information Primary Emergency Contact: Kofman,[name] Address: [phone]          [place] [address] [name] Home Phone: [phone] Relation: None Secondary Emergency Contact: [facility] Address: [address]          [place], [place] [address] [name] Home Phone: [phone] Relation: Son  Code Status:  DNR  Goals of care: Advanced Directive information Advanced Directives 02/01/2016  Does patient have an advance directive? Yes  Type of Advance Directive Out of facility DNR (pink MOST or yellow form)  Does patient want to make changes to advanced directive? No - Patient declined  Copy of advanced directive(s) in chart? Yes  Would patient like information on creating an advanced directive? -  Pre-existing out of facility DNR order (yellow form or pink MOST form) Yellow form placed in chart (order not valid for inpatient use)     Chief Complaint  Patient presents with  . Acute Visit    rash     HPI:  Pt is a 82 y.o. male seen today at [facility] for an acute visit for evaluation of rash on both sides of the face. He is seen in his room today. Patient's wife and facility CNA states redness on both sides of the face seems to be worsening. No fever, chills or change of lotion or soap reported. Patient's wife reports patient does not seem to scratch rash areas. Patient unable to provider HPI and ROS due to presence of dementia.   Past Medical History:  Diagnosis Date  . Hallucinations   . 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 Other vitamin B12 deficiency anemia   . Paralysis agitans (HCC)   . Parkinson's disease (HCC)  03/27/2013  . Unspecified transient cerebral ischemia    Past Surgical History:  Procedure Laterality Date  . APPENDECTOMY    . CHOLECYSTECTOMY    . PTCA  1987  . TRANSURETHRAL RESECTION OF PROSTATE      No Known Allergies    Medication List       Accurate as of 02/22/16  8:07 PM. Always use your most recent med list.          AMBULATORY NON FORMULARY MEDICATION Magic Cup Sig: One cup by mouth every day with lunch meal for weight loss   aspirin 325 MG tablet Take 325 mg by mouth daily.   carbidopa-levodopa 25-100 MG tablet Commonly known as:  SINEMET IR 1.5 tablets by mouth three times daily. Dosing times : 1 hour before meals or 2 hours after meals.   donepezil 10 MG tablet Commonly known as:  ARICEPT Take 1 tablet by mouth at  bedtime   fish oil-omega-3 fatty acids 1000 MG capsule Take 1,000 mg by mouth daily.   ketoconazole 2 % shampoo Commonly known as:  NIZORAL Apply 1 application topically 2 (two) times a week.   MALTODEXTRIN-XANTHAN GUM PO Give 1gm by mouth as needed due to high risk for aspiration   multivitamin-lutein Caps capsule Take 1 capsule by mouth daily.   omeprazole 20 MG capsule Commonly known as:  PRILOSEC Take 20 mg by mouth daily.   oxybutynin 5 MG tablet  Commonly known as:  DITROPAN Take 1 tablet by mouth  nightly   OXYGEN Inhale 3 L into the lungs. 3 Lpm   TYLENOL 500 MG tablet Generic drug:  acetaminophen Take 500 mg by mouth every 6 (six) hours as needed for moderate pain.   Vitamin D-3 1000 units Caps Take 1 capsule by mouth daily.       Review of Systems  Unable to perform ROS: Dementia    Immunization History  Administered Date(s) Administered  . PPD Test 09/05/2015, 09/20/2015   Pertinent  Health Maintenance Due  Topic Date Due  . INFLUENZA VACCINE  12/14/2015  . PNA vac Low Risk Adult (1 of 2 - PCV13) 05/15/2016 (Originally 08/28/1992)   Fall Risk  01/04/2016 12/31/2015  Falls in the past year? Yes  Exclusion - non ambulatory  Number falls in past yr: 2 or more -  Injury with Fall? Yes -  Risk Factor Category  High Fall Risk -  Risk for fall due to : Impaired balance/gait;Impaired mobility -  Follow up Falls prevention discussed -      Vitals:   02/22/16 1145  BP: 133/63  Pulse: 69  Resp: 18  Temp: 97.9 F (36.6 C)  SpO2: 97%  Weight: 150 lb (68 kg)  Height: 5\' 2"  (1.575 m)   Body mass index is 27.44 kg/m. Physical Exam  Constitutional: He appears well-developed and well-nourished. No distress.  Pleasantly confused at baseline  HENT:  Head: Normocephalic.  Mouth/Throat: Oropharynx is clear and moist.  Eyes: Conjunctivae and EOM are normal. Pupils are equal, round, and reactive to light. Right eye exhibits no discharge. No scleral icterus.  Neck: Neck supple. No JVD present. No thyromegaly present.  Cardiovascular: Normal rate, regular rhythm, normal heart sounds and intact distal pulses.  Exam reveals no gallop and no friction rub.   No murmur heard. Pulmonary/Chest: Effort normal and breath sounds normal. No respiratory distress. He has no wheezes.     Abdominal: Soft. Bowel sounds are normal. He exhibits no distension. There is no tenderness. There is no rebound and no guarding.  Lymphadenopathy:    He has no cervical adenopathy.  Neurological: He is alert.  Confused at baseline.   Skin: Skin is warm and dry. No pallor.   Bilateral face redness with whitish flaky dry skin noted with extension to neck area.   Psychiatric: He has a normal mood and affect.    Labs reviewed:  Recent Labs  09/02/15 0455 09/03/15 0514 09/04/15 0513 09/05/15 09/05/15 0539 09/08/15  NA 143 144 139 142 142 141  K 4.3 4.6 4.2  --  4.0 4.6  CL 112* 111 106  --  108  --   CO2 24 24 22   --  26  --   GLUCOSE 100* 109* 84  --  106*  --   BUN 24* 18 15 14 14 14   CREATININE 1.05 0.84 0.84 0.8 0.83 0.9  CALCIUM 9.1 10.1 9.3  --  9.8  --   MG 1.9  --   --   --   --   --     Recent  Labs  08/30/15 0450 08/31/15 2013 09/08/15  AST 20 33 38  ALT 14* 7* 27  ALKPHOS 54 61 69  BILITOT 0.9 0.6  --   PROT 6.0* 6.1*  --   ALBUMIN 3.3* 3.2*  --     Recent Labs  08/30/15 0450 08/31/15 2013  09/03/15 [id number] 09/04/15 0513 09/05/15 09/05/15 0539 09/08/15  WBC 8.5 16.0*  < > 7.5 6.0 5.2 5.2 7.3  NEUTROABS 5.5 14.2*  --   --   --   --   --   --   HGB 11.6* 11.4*  < > 10.8* 10.6*  --  10.6* 11.9*  HCT 35.3* 34.2*  < > 33.1* 32.4*  --  31.9* 38*  MCV 91.2 91.0  < > 92.7 91.5  --  89.9  --   PLT 251 215  < > 175 199  --  230 375  < > = values in this interval not displayed. Lab Results  Component Value Date   TSH 2.336 08/30/2015     Assessment/Plan  Rash and nonspecific skin eruption Afebrile.Bilateral face and neck area redness with whitish flaky skin.Start Triamcinolone 0.1 % cream apply twice daily x 1 week. Continue to monitor.     Family/ staff Communication: Reviewed plan with patient , patient's wife and facility Nurse supervisor.   Labs/tests ordered: None

## 2016-02-28 ENCOUNTER — Non-Acute Institutional Stay (SKILLED_NURSING_FACILITY): Payer: Medicare Other | Admitting: Family

## 2016-02-28 DIAGNOSIS — N400 Enlarged prostate without lower urinary tract symptoms: Secondary | ICD-10-CM | POA: Diagnosis not present

## 2016-02-28 DIAGNOSIS — G20 Parkinson's disease: Secondary | ICD-10-CM

## 2016-02-28 DIAGNOSIS — E559 Vitamin D deficiency, unspecified: Secondary | ICD-10-CM

## 2016-02-28 DIAGNOSIS — R1310 Dysphagia, unspecified: Secondary | ICD-10-CM | POA: Diagnosis not present

## 2016-02-28 DIAGNOSIS — K219 Gastro-esophageal reflux disease without esophagitis: Secondary | ICD-10-CM | POA: Diagnosis not present

## 2016-02-28 NOTE — Progress Notes (Signed)
Location:  [facility] Room Number: 1006 A  Place of Service:  SNF (31) Provider:  Dinah Ngetich FNP-C   Lupe Carney, MD  Patient Care Team: L.Lupe Carney, MD as PCP - General (Family Medicine)  Extended Emergency Contact Information Primary Emergency Contact: Nordby,[name] Address: [phone]          [place] [address] [name] Home Phone: [phone] Relation: None Secondary Emergency Contact: [facility] Address: [address]          [place], [place] [address] [name] Home Phone: [phone] Relation: Son  Code Status:  DNR  Goals of care: Advanced Directive information Advanced Directives 02/01/2016  Does patient have an advance directive? Yes  Type of Advance Directive Out of facility DNR (pink MOST or yellow form)  Does patient want to make changes to advanced directive? No - Patient declined  Copy of advanced directive(s) in chart? Yes  Would patient like information on creating an advanced directive? -  Pre-existing out of facility DNR order (yellow form or pink MOST form) Yellow form placed in chart (order not valid for inpatient use)     Chief Complaint  Patient presents with  . Medical Management of Chronic Issues    HPI:  Pt is a 82 y.o. male seen today at [facility] for medical management of chronic diseases. He has a medical history of Parkinson's disease, GERD, Dysphagia, Dementia among other conditions. He is seen in his room today. Patient's wife states previous rash on the face is resolved. No recent fall episodes or hospital admission. Facility Nurse reports no new concerns.    Past Medical History:  Diagnosis Date  . Hallucinations   . 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 Other vitamin B12 deficiency anemia   . Paralysis agitans (HCC)   . Parkinson's disease (HCC) 03/27/2013  . Unspecified transient cerebral  ischemia    Past Surgical History:  Procedure Laterality Date  . APPENDECTOMY    . CHOLECYSTECTOMY    . PTCA  1987  . TRANSURETHRAL RESECTION OF PROSTATE      No Known Allergies    Medication List       Accurate as of 02/28/16  5:41 PM. Always use your most recent med list.          AMBULATORY NON FORMULARY MEDICATION Magic Cup Sig: One cup by mouth every day with lunch meal for weight loss   aspirin 325 MG tablet Take 325 mg by mouth daily.   carbidopa-levodopa 25-100 MG tablet Commonly known as:  SINEMET IR 1.5 tablets by mouth three times daily. Dosing times : 1 hour before meals or 2 hours after meals.   donepezil 10 MG tablet Commonly known as:  ARICEPT Take 1 tablet by mouth at  bedtime   fish oil-omega-3 fatty acids 1000 MG capsule Take 1,000 mg by mouth daily.   ketoconazole 2 % shampoo Commonly known as:  NIZORAL Apply 1 application topically 2 (two) times a week.   MALTODEXTRIN-XANTHAN GUM PO Give 1gm by mouth as needed due to high risk for aspiration   multivitamin-lutein Caps capsule Take 1 capsule by mouth daily.   omeprazole 20 MG capsule Commonly known as:  PRILOSEC Take 20 mg by mouth daily.   oxybutynin 5 MG tablet Commonly known as:  DITROPAN Take 1 tablet by mouth  nightly   OXYGEN Inhale 3 L into the lungs. 3  Lpm   triamcinolone cream 0.1 % Commonly known as:  KENALOG Apply 1 application topically 2 (two) times daily.   TYLENOL 500 MG tablet Generic drug:  acetaminophen Take 500 mg by mouth every 6 (six) hours as needed for moderate pain.   Vitamin D-3 1000 units Caps Take 1 capsule by mouth daily.       Review of Systems  Unable to perform ROS: Dementia    Immunization History  Administered Date(s) Administered  . PPD Test 09/05/2015, 09/20/2015   Pertinent  Health Maintenance Due  Topic Date Due  . INFLUENZA VACCINE  12/14/2015  . PNA vac Low Risk Adult (1 of 2 - PCV13) 05/15/2016 (Originally [birth year]28/1992)   Fall  Risk  01/04/2016 12/31/2015  Falls in the past year? Yes Exclusion - non ambulatory  Number falls in past yr: 2 or more -  Injury with Fall? Yes -  Risk Factor Category  High Fall Risk -  Risk for fall due to : Impaired balance/gait;Impaired mobility -  Follow up Falls prevention discussed -      Vitals:   02/28/16 1145  BP: 129/69  Pulse: 80  Resp: 20  SpO2: 96%  Height: 5\' 2"  (1.575 m)   There is no height or weight on file to calculate BMI. Physical Exam  Constitutional: He appears well-developed and well-nourished. No distress.  Pleasantly confused at baseline  HENT:  Head: Normocephalic.  Mouth/Throat: Oropharynx is clear and moist.  Eyes: Conjunctivae and EOM are normal. Pupils are equal, round, and reactive to light. Right eye exhibits no discharge. No scleral icterus.  Neck: Neck supple. No JVD present. No thyromegaly present.  Cardiovascular: Normal rate, regular rhythm, normal heart sounds and intact distal pulses.  Exam reveals no gallop and no friction rub.   No murmur heard. Pulmonary/Chest: Effort normal and breath sounds normal. No respiratory distress. He has no wheezes.  Abdominal: Soft. Bowel sounds are normal. He exhibits no distension. There is no tenderness. There is no rebound and no guarding.  Genitourinary:  Genitourinary Comments: Incontinent for both B/B   Musculoskeletal: He exhibits no edema, tenderness or deformity.  Left hand weakness with limited ROM  Lymphadenopathy:    He has no cervical adenopathy.  Neurological: He is alert.  Confused at baseline.   Skin: Skin is warm and dry. No rash noted. No erythema. No pallor.  Skin intact   Psychiatric: He has a normal mood and affect.    Labs reviewed:  Recent Labs  09/02/15 0455 09/03/15 0514 09/04/15 0513 09/05/15 09/05/15 0539 09/08/15  NA 143 144 139 142 142 141  K 4.3 4.6 4.2  --  4.0 4.6  CL 112* 111 106  --  108  --   CO2 24 24 22   --  26  --   GLUCOSE 100* 109* 84  --  106*  --     BUN 24* 18 15 14 14 14   CREATININE 1.05 0.84 0.84 0.8 0.83 0.9  CALCIUM 9.1 10.1 9.3  --  9.8  --   MG 1.9  --   --   --   --   --     Recent Labs  08/30/15 0450 08/31/15 2013 09/08/15  AST 20 33 38  ALT 14* 7* 27  ALKPHOS 54 61 69  BILITOT 0.9 0.6  --   PROT 6.0* 6.1*  --   ALBUMIN 3.3* 3.2*  --     Recent Labs  08/30/15 0450 08/31/15 2013  09/03/15 [id number]  09/04/15 0513 09/05/15 09/05/15 0539 09/08/15  WBC 8.5 16.0*  < > 7.5 6.0 5.2 5.2 7.3  NEUTROABS 5.5 14.2*  --   --   --   --   --   --   HGB 11.6* 11.4*  < > 10.8* 10.6*  --  10.6* 11.9*  HCT 35.3* 34.2*  < > 33.1* 32.4*  --  31.9* 38*  MCV 91.2 91.0  < > 92.7 91.5  --  89.9  --   PLT 251 215  < > 175 199  --  230 375  < > = values in this interval not displayed. Lab Results  Component Value Date   TSH 2.336 08/30/2015     Assessment/Plan 1. Parkinson's disease (HCC) Progressive decline expected. Continue on Sinemet. Continue to assist with ADL's .Aspiration precautions.   2. Benign prostatic hyperplasia without lower urinary tract symptoms Continue on ditropan 5 mg tablet.   3. Gastroesophageal reflux disease without esophagitis Continue on Prilosec.   4. Dysphagia, unspecified type Continue to assist with all meals. Continue with Aspiration precautions.   5. Vitamin D deficiency Continue on vit D supplements.     Family/ staff Communication: Reviewed plan of care with patient's wife and facility Nurse supervisor.   Labs/tests ordered:  None

## 2016-03-09 ENCOUNTER — Non-Acute Institutional Stay (SKILLED_NURSING_FACILITY): Payer: Medicare Other | Admitting: Family

## 2016-03-09 DIAGNOSIS — B354 Tinea corporis: Secondary | ICD-10-CM | POA: Diagnosis not present

## 2016-03-09 MED ORDER — TERBINAFINE HCL 1 % EX CREA: TOPICAL_CREAM | Freq: Every day | CUTANEOUS | Status: DC

## 2016-03-09 NOTE — Progress Notes (Signed)
Location:  [facility] Room Number: 1006 A  Place of Service:  SNF (31) Provider: Rakeb Kibble FNP-C   [name], MD  Patient Care Team: L.[name], MD as PCP - General (Family Medicine)  Extended Emergency Contact Information Primary Emergency Contact: Hoque,[name] Address: [phone] [facility] [address]          [place] [id number] [name]: [phone] Relation: None Secondary Emergency Contact: [name] Address: [address]          [place], [place] [address] [name]: [phone] Relation: Son  Code Status: DNR  Goals of care: Advanced Directive information Advanced Directives 02/01/2016  Does patient have an advance directive? Yes  Type of Advance Directive Out of facility DNR (pink MOST or yellow form)  Does patient want to make changes to advanced directive? No - Patient declined  Copy of advanced directive(s) in chart? Yes  Would patient like information on creating an advanced directive? -  Pre-existing out of facility DNR order (yellow form or pink MOST form) Yellow form placed in chart (order not valid for inpatient use)     Chief Complaint  Patient presents with  . Acute Visit    rash     HPI:  Pt is a 82 y.o. male seen today at [facility]  for an acute visit for evaluation of rash on left leg. He has a medical history of Parkinson disease, Dementia, BPH among other conditions. He is seen in his room today per facility Nurse request. He is alert and follows commands this visit but unable to provide HPI and ROS information.    Past Medical History:  Diagnosis Date  . Hallucinations   . 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 Other vitamin B12 deficiency anemia   . Paralysis agitans (HCC)   . Parkinson's disease (HCC) 03/27/2013  . Unspecified transient cerebral ischemia    Past Surgical History:  Procedure  Laterality Date  . APPENDECTOMY    . CHOLECYSTECTOMY    . PTCA  1987  . TRANSURETHRAL RESECTION OF PROSTATE      No Known Allergies    Medication List       Accurate as of 03/09/16  1:45 PM. Always use your most recent med list.          AMBULATORY NON FORMULARY MEDICATION Magic Cup Sig: One cup by mouth every day with lunch meal for weight loss   aspirin 325 MG tablet Take 325 mg by mouth daily.   carbidopa-levodopa 25-100 MG tablet Commonly known as:  SINEMET IR 1.5 tablets by mouth three times daily. Dosing times : 1 hour before meals or 2 hours after meals.   donepezil 10 MG tablet Commonly known as:  ARICEPT Take 1 tablet by mouth at  bedtime   fish oil-omega-3 fatty acids 1000 MG capsule Take 1,000 mg by mouth daily.   ketoconazole 2 % shampoo Commonly known as:  NIZORAL Apply 1 application topically 2 (two) times a week.   MALTODEXTRIN-XANTHAN GUM PO Give 1gm by mouth as needed due to high risk for aspiration   multivitamin-lutein Caps capsule Take 1 capsule by mouth daily.   omeprazole 20 MG capsule Commonly known as:  PRILOSEC Take 20 mg by mouth daily.   oxybutynin 5 MG tablet Commonly known as:  DITROPAN Take 1 tablet by mouth  nightly   OXYGEN Inhale 3 L into the  lungs. 3 Lpm   TYLENOL 500 MG tablet Generic drug:  acetaminophen Take 500 mg by mouth every 6 (six) hours as needed for moderate pain.   Vitamin D-3 1000 units Caps Take 1 capsule by mouth daily.       Review of Systems  Unable to perform ROS: Dementia    Immunization History  Administered Date(s) Administered  . PPD Test 09/05/2015, 09/20/2015   Pertinent  Health Maintenance Due  Topic Date Due  . INFLUENZA VACCINE  12/14/2015  . PNA vac Low Risk Adult (1 of 2 - PCV13) 05/15/2016 (Originally 08/28/1992)   Fall Risk  01/04/2016 12/31/2015  Falls in the past year? Yes Exclusion - non ambulatory  Number falls in past yr: 2 or more -  Injury with Fall? Yes -  Risk  Factor Category  High Fall Risk -  Risk for fall due to : Impaired balance/gait;Impaired mobility -  Follow up Falls prevention discussed -      Vitals:   03/09/16 1130  BP: (!) 142/79  Pulse: 63  Resp: 18  Temp: 97.9 F (36.6 C)  SpO2: 99%  Weight: 150 lb (68 kg)  Height: 5\' 2"  (1.575 m)   Body mass index is 27.44 kg/m. Physical Exam  Constitutional: He appears well-developed and well-nourished. No distress.  Pleasantly confused at baseline more alert this visit follows simple commands.   HENT:  Head: Normocephalic.  Mouth/Throat: Oropharynx is clear and moist.  Eyes: Conjunctivae and EOM are normal. Pupils are equal, round, and reactive to light. Right eye exhibits no discharge. No scleral icterus.  Neck: Neck supple. No JVD present. No thyromegaly present.  Cardiovascular: Normal rate, regular rhythm, normal heart sounds and intact distal pulses.  Exam reveals no gallop and no friction rub.   No murmur heard. Pulmonary/Chest: Effort normal and breath sounds normal. No respiratory distress. He has no wheezes.  Abdominal: Soft. Bowel sounds are normal. He exhibits no distension. There is no tenderness. There is no rebound and no guarding.  Genitourinary:  Genitourinary Comments: Incontinent for both B/B   Musculoskeletal: He exhibits no edema, tenderness or deformity.  Wheelchair bound   Lymphadenopathy:    He has no cervical adenopathy.  Neurological: He is alert.  Confused at baseline. Follows simple commands.   Skin: Skin is warm and dry. No rash noted. No erythema. No pallor.  Left lateral shin area greater than a quarter red circular rash with central clearing.   Psychiatric: He has a normal mood and affect.    Labs reviewed:  Recent Labs  09/02/15 0455 09/03/15 0514 09/04/15 0513 09/05/15 09/05/15 0539 09/08/15  NA 143 144 139 142 142 141  K 4.3 4.6 4.2  --  4.0 4.6  CL 112* 111 106  --  108  --   CO2 24 24 22   --  26  --   GLUCOSE 100* 109* 84  --  106*   --   BUN 24* 18 15 14 14 14   CREATININE 1.05 0.84 0.84 0.8 0.83 0.9  CALCIUM 9.1 10.1 9.3  --  9.8  --   MG 1.9  --   --   --   --   --     Recent Labs  08/30/15 0450 08/31/15 2013 09/08/15  AST 20 33 38  ALT 14* 7* 27  ALKPHOS 54 61 69  BILITOT 0.9 0.6  --   PROT 6.0* 6.1*  --   ALBUMIN 3.3* 3.2*  --  Recent Labs  08/30/15 0450 08/31/15 2013  09/03/15 0514 09/04/15 0513 09/05/15 09/05/15 0539 09/08/15  WBC 8.5 16.0*  < > 7.5 6.0 5.2 5.2 7.3  NEUTROABS 5.5 14.2*  --   --   --   --   --   --   HGB 11.6* 11.4*  < > 10.8* 10.6*  --  10.6* 11.9*  HCT 35.3* 34.2*  < > 33.1* 32.4*  --  31.9* 38*  MCV 91.2 91.0  < > 92.7 91.5  --  89.9  --   PLT 251 215  < > 175 199  --  230 375  < > = values in this interval not displayed. Lab Results  Component Value Date   TSH 2.336 08/30/2015   Assessment/Plan   Tinea corporis Left lateral shin area with circular rash with central clearing. Apply Terbinafine topical 1% cream once daily X 4 weeks. Continue to monitor.     Family/ staff Communication: Reviewed plan of care with patient and facility Nurse supervisor.   Labs/tests ordered: None

## 2016-04-04 ENCOUNTER — Encounter: Payer: Self-pay | Admitting: Family

## 2016-04-04 ENCOUNTER — Non-Acute Institutional Stay (SKILLED_NURSING_FACILITY): Payer: Medicare Other | Admitting: Family

## 2016-04-04 DIAGNOSIS — K219 Gastro-esophageal reflux disease without esophagitis: Secondary | ICD-10-CM

## 2016-04-04 DIAGNOSIS — G20 Parkinson's disease: Secondary | ICD-10-CM | POA: Diagnosis not present

## 2016-04-04 DIAGNOSIS — N400 Enlarged prostate without lower urinary tract symptoms: Secondary | ICD-10-CM | POA: Diagnosis not present

## 2016-04-04 DIAGNOSIS — E559 Vitamin D deficiency, unspecified: Secondary | ICD-10-CM | POA: Diagnosis not present

## 2016-04-04 DIAGNOSIS — F0391 Unspecified dementia with behavioral disturbance: Secondary | ICD-10-CM | POA: Diagnosis not present

## 2016-04-04 NOTE — Progress Notes (Signed)
Location:  [facility] Room Number: 1105 A Place of Service:  SNF (31) Provider: Richarda Bladeinah Williom Cedar, FNP-C  [name], MD  Patient Care Team: L.[name], MD as PCP - General (Family Medicine)  Extended Emergency Contact Information Primary Emergency Contact: Hufstedler,[name] Address: [phone] [address]          [place] [id number] [name]: [phone] Relation: None Secondary Emergency Contact: [name] Address: [address]          [place], [place] [address] [name]: [phone] Relation: Son  Code Status:  DNR Goals of care: Advanced Directive information Advanced Directives 04/04/2016  Does Patient Have a Medical Advance Directive? Yes  Type of Advance Directive Out of facility DNR (pink MOST or yellow form)  Does patient want to make changes to medical advance directive? No - Patient declined  Copy of Healthcare Power of Attorney in Chart? -  Would patient like information on creating a medical advance directive? -  Pre-existing out of facility DNR order (yellow form or pink MOST form) Pink MOST form placed in chart (order not valid for inpatient use)     Chief Complaint  Patient presents with  . Medical Management of Chronic Issues    Routine Visit    HPI:  Pt is a 82 y.o. male [facility] today [facility]  for medical management of chronic diseases. He has a medical history of Parkinson's disease, Dementia, BPH among other conditions. He is seen in his room today. Unable to provide HPI and ROS due to dementia. He has had no recent fall episodes, weight loss or hospital admission. No skin breakdown. He continues to require total care assistance with all ADL's. Facility staff report no new concerns this visit.     Past Medical History:  Diagnosis Date  . Hallucinations   . 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 Other  vitamin B12 deficiency anemia   . Paralysis agitans (HCC)   . Parkinson's disease (HCC) 03/27/2013  . Unspecified transient cerebral ischemia    Past Surgical History:  Procedure Laterality Date  . APPENDECTOMY    . CHOLECYSTECTOMY    . PTCA  1987  . TRANSURETHRAL RESECTION OF PROSTATE      No Known Allergies    Medication List       Accurate as of 04/04/16 11:59 PM. Always use your most recent med list.          AMBULATORY NON FORMULARY MEDICATION Magic Cup Sig: One cup by mouth every day with lunch meal for weight loss   aspirin 325 MG tablet Take 325 mg by mouth daily.   carbidopa-levodopa 25-100 MG tablet Commonly known as:  SINEMET IR 1.5 tablets by mouth three times daily. Dosing times : 1 hour before meals or 2 hours after meals.   donepezil 10 MG tablet Commonly known as:  ARICEPT Take 1 tablet by mouth at  bedtime   fish oil-omega-3 fatty acids 1000 MG capsule Take 1,000 mg by mouth daily.   ketoconazole 2 % shampoo Commonly known as:  NIZORAL Apply 1 application topically 2 (two) times a week. Wed/saturday 3-11 shift until resolved   MALTODEXTRIN-XANTHAN GUM PO Give 1gm by mouth as needed due to high risk for aspiration   multivitamin-lutein Caps capsule Take 1 capsule by mouth daily.   NUTRITIONAL SUPPLEMENT PO Give 120 ml fluid of choice with med pass  with thickener (nectar)   omeprazole 20 MG capsule Commonly known as:  PRILOSEC Take 20 mg by mouth daily.   oxybutynin 5 MG tablet Commonly known as:  DITROPAN Take 1 tablet by mouth  nightly   OXYGEN Inhale 3 L into the lungs. 3 Lpm   TYLENOL 500 MG tablet Generic drug:  acetaminophen Take 500 mg by mouth every 6 (six) hours as needed for moderate pain.   Vitamin D-3 1000 units Caps Take 1 capsule by mouth daily.       Review of Systems  Unable to perform ROS: Dementia    Immunization History  Administered Date(s) Administered  . Influenza-Unspecified 02/21/2016  . PPD Test  09/05/2015, 09/20/2015   Pertinent  Health Maintenance Due  Topic Date Due  . PNA vac Low Risk Adult (1 of 2 - PCV13) 05/15/2016 (Originally 08/28/1992)  . INFLUENZA VACCINE  Completed   Fall Risk  01/04/2016 12/31/2015  Falls in the past year? Yes Exclusion - non ambulatory  Number falls in past yr: 2 or more -  Injury with Fall? Yes -  Risk Factor Category  High Fall Risk -  Risk for fall due to : Impaired balance/gait;Impaired mobility -  Follow up Falls prevention discussed -      Vitals:   04/04/16 1424  BP: 125/70  Pulse: 70  Resp: 20  Temp: 97.6 F (36.4 C)  SpO2: 95%  Weight: 148 lb (67.1 kg)  Height: 5\' 2"  (1.575 m)   Body mass index is 27.07 kg/m. Physical Exam  Constitutional: He appears well-developed and well-nourished. No distress.   confused at baseline. Non-verbal during visit.   HENT:  Head: Normocephalic.  Mouth/Throat: Oropharynx is clear and moist.  Eyes: Conjunctivae and EOM are normal. Pupils are equal, round, and reactive to light. Right eye exhibits no discharge. No scleral icterus.  Neck: Neck supple. No JVD present. No thyromegaly present.  Cardiovascular: Normal rate, regular rhythm, normal heart sounds and intact distal pulses.  Exam reveals no gallop and no friction rub.   No murmur heard. Pulmonary/Chest: Effort normal and breath sounds normal. No respiratory distress. He has no wheezes.  Abdominal: Soft. Bowel sounds are normal. He exhibits no distension. There is no tenderness. There is no rebound and no guarding.  Genitourinary:  Genitourinary Comments: Incontinent for both B/B   Musculoskeletal: He exhibits no edema, tenderness or deformity.  Wheelchair bound. Moves x 4 extremities though slow and rigidity noted   Lymphadenopathy:    He has no cervical adenopathy.  Neurological: He is alert.  Confused at baseline.Nonverbal during visit  Skin: Skin is warm and dry. No rash noted. No erythema. No pallor.  Psychiatric: He has a normal  mood and affect.    Labs reviewed:  Recent Labs  09/02/15 0455 09/03/15 0514 09/04/15 0513 09/05/15 09/05/15 0539 09/08/15  NA 143 144 139 142 142 141  K 4.3 4.6 4.2  --  4.0 4.6  CL 112* 111 106  --  108  --   CO2 24 24 22   --  26  --   GLUCOSE 100* 109* 84  --  106*  --   BUN 24* 18 15 14 14 14   CREATININE 1.05 0.84 0.84 0.8 0.83 0.9  CALCIUM 9.1 10.1 9.3  --  9.8  --   MG 1.9  --   --   --   --   --     Recent Labs  08/30/15 0450 08/31/15 2013 09/08/15  AST 20  33 38  ALT 14* 7* 27  ALKPHOS 54 61 69  BILITOT 0.9 0.6  --   PROT 6.0* 6.1*  --   ALBUMIN 3.3* 3.2*  --     Recent Labs  08/30/15 0450 08/31/15 2013  09/03/15 0514 09/04/15 0513 09/05/15 09/05/15 0539 09/08/15  WBC 8.5 16.0*  < > 7.5 6.0 5.2 5.2 7.3  NEUTROABS 5.5 14.2*  --   --   --   --   --   --   HGB 11.6* 11.4*  < > 10.8* 10.6*  --  10.6* 11.9*  HCT 35.3* 34.2*  < > 33.1* 32.4*  --  31.9* 38*  MCV 91.2 91.0  < > 92.7 91.5  --  89.9  --   PLT 251 215  < > 175 199  --  230 375  < > = values in this interval not displayed. Lab Results  Component Value Date   TSH 2.336 08/30/2015   Assessment/Plan Parkinson's disease Progressive decline expected. Continue sinemet 25-100 mg Tablet. Continue with Aspiration precautions.    Dementia No new behavioral issues reported. Continue to assist with ADL's. Fall and safety precautions. Encourage oral and fluid intake. Continue Aricept, MVI and Protein supplements.   BPH   continue on oxybutynin.   GERD Continue on Omeprazole.   Vit D Deficinecy Continue on vit D supplements. Monitor vit D level.   Family/ staff Communication: Reviewed plan of care with facility Nurse supervisor.   Labs/tests ordered: None

## 2016-04-26 ENCOUNTER — Encounter: Payer: Self-pay | Admitting: Internal Medicine

## 2016-04-26 ENCOUNTER — Non-Acute Institutional Stay (SKILLED_NURSING_FACILITY): Payer: Medicare Other | Admitting: Internal Medicine

## 2016-04-26 DIAGNOSIS — F0280 Dementia in other diseases classified elsewhere without behavioral disturbance: Secondary | ICD-10-CM | POA: Diagnosis not present

## 2016-04-26 DIAGNOSIS — I679 Cerebrovascular disease, unspecified: Secondary | ICD-10-CM

## 2016-04-26 DIAGNOSIS — R1310 Dysphagia, unspecified: Secondary | ICD-10-CM

## 2016-04-26 DIAGNOSIS — I6789 Other cerebrovascular disease: Secondary | ICD-10-CM | POA: Insufficient documentation

## 2016-04-26 DIAGNOSIS — E440 Moderate protein-calorie malnutrition: Secondary | ICD-10-CM | POA: Diagnosis not present

## 2016-04-26 DIAGNOSIS — G20 Parkinson's disease: Secondary | ICD-10-CM | POA: Diagnosis not present

## 2016-04-26 NOTE — Progress Notes (Signed)
[facility]  PCP: [name], MD   Code Status: DNR  Goals of care: Advanced Directive information Advanced Directives 04/04/2016  Does Patient Have a Medical Advance Directive? Yes  Type of Advance Directive Out of facility DNR (pink MOST or yellow form)  Does patient want to make changes to medical advance directive? No - Patient declined  Copy of Healthcare Power of Attorney in Chart? -  Would patient like information on creating a medical advance directive? -  Pre-existing out of facility DNR order (yellow form or pink MOST form) Pink MOST form placed in chart (order not valid for inpatient use)       Extended Emergency Contact Information Primary Emergency Contact: Seeley,[name] Address: [phone] [facility] [address]          [place] [id number] [place] Home Phone: [phone] Relation: None Secondary Emergency Contact: [facility],[name] Address: [address]          [place], [place] [address] [name]: [phone] Relation: Son   No Known Allergies  Chief Complaint  Patient presents with  . Medical Management of Chronic Issues    Routine Visit     HPI:  Patient is a 82 y.o. male seen today for routine visit. He is seen in his room with his wife at bedside. He has been at his baseline. He participates minimally in HPI and ROS. No fall reported. No new skin concern. No acute behavior change reported.   Review of Systems: Unable to obtain from patient    Past Medical History:  Diagnosis Date  . Hallucinations   . 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 Other vitamin B12 deficiency anemia   . Paralysis agitans (HCC)   . Parkinson's disease (HCC) 03/27/2013  . Unspecified transient cerebral ischemia     Medications:   Medication List       Accurate as of 04/26/16  3:08 PM. Always use your most recent med list.          AMBULATORY NON FORMULARY MEDICATION Magic  Cup Sig: One cup by mouth every day with lunch meal for weight loss   aspirin 325 MG tablet Take 325 mg by mouth daily.   carbidopa-levodopa 25-100 MG tablet Commonly known as:  SINEMET IR 1.5 tablets by mouth three times daily. Dosing times : 1 hour before meals or 2 hours after meals.   donepezil 10 MG tablet Commonly known as:  ARICEPT Take 1 tablet by mouth at  bedtime   fish oil-omega-3 fatty acids 1000 MG capsule Take 1,000 mg by mouth daily.   ketoconazole 2 % shampoo Commonly known as:  NIZORAL Apply 1 application topically 2 (two) times a week. Wed/saturday 3-11 shift until resolved   MALTODEXTRIN-XANTHAN GUM PO Give 1gm by mouth as needed due to high risk for aspiration   multivitamin-lutein Caps capsule Take 1 capsule by mouth daily.   omeprazole 20 MG capsule Commonly known as:  PRILOSEC Take 20 mg by mouth daily.   oxybutynin 5 MG tablet Commonly known as:  DITROPAN Take 1 tablet by mouth  nightly   OXYGEN Inhale 3 L into the lungs. 3 Lpm   TYLENOL 500 MG tablet Generic drug:  acetaminophen Take 500 mg by mouth every 6 (six) hours as needed for moderate pain.   Vitamin D-3 1000 units Caps Take 1 capsule by mouth daily.       Immunizations: Immunization History  Administered Date(s)  Administered  . Influenza-Unspecified 02/21/2016  . PPD Test 09/05/2015, 09/20/2015     Physical Exam: Vitals:   04/26/16 1458  BP: 119/64  Pulse: 80  Resp: 20  Temp: 98 F (36.7 C)  TempSrc: Oral  Weight: 148 lb (67.1 kg)  Height: 5\' 2"  (1.575 m)   Body mass index is 27.07 kg/m.  General- elderly male, frail, in no acute distress Head- normocephalic, atraumatic Nose- no nasal discharge Throat- moist mucus membrane Eyes-  no pallor, no icterus, no discharge Neck- no cervical lymphadenopathy Cardiovascular- normal s1,s2, no murmur Respiratory- poor air movement, no wheeze, no rhonchi, no crackles, no use of accessory muscles, on o2 by nasal  canula Abdomen- bowel sounds present, soft, non tender Musculoskeletal- able to move all 4 extremities, generalized weakness, trace leg edema, on wheelchair Neurological- pleasantly confused Skin- warm and dry    Labs reviewed: Basic Metabolic Panel:  Recent Labs  16/02/9603/20/17 0455 09/03/15 0514 09/04/15 0513 09/05/15 09/05/15 0539 09/08/15  NA 143 144 139 142 142 141  K 4.3 4.6 4.2  --  4.0 4.6  CL 112* 111 106  --  108  --   CO2 24 24 22   --  26  --   GLUCOSE 100* 109* 84  --  106*  --   BUN 24* 18 15 14 14 14   CREATININE 1.05 0.84 0.84 0.8 0.83 0.9  CALCIUM 9.1 10.1 9.3  --  9.8  --   MG 1.9  --   --   --   --   --    Liver Function Tests:  Recent Labs  08/30/15 0450 08/31/15 2013 09/08/15  AST 20 33 38  ALT 14* 7* 27  ALKPHOS 54 61 69  BILITOT 0.9 0.6  --   PROT 6.0* 6.1*  --   ALBUMIN 3.3* 3.2*  --    No results for input(s): LIPASE, AMYLASE in the last 8760 hours.  Recent Labs  08/29/15 2041 08/31/15 0459 09/03/15 1146  AMMONIA 137* 26 26   CBC:  Recent Labs  08/30/15 0450 08/31/15 2013  09/03/15 0514 09/04/15 0513 09/05/15 09/05/15 0539 09/08/15  WBC 8.5 16.0*  < > 7.5 6.0 5.2 5.2 7.3  NEUTROABS 5.5 14.2*  --   --   --   --   --   --   HGB 11.6* 11.4*  < > 10.8* 10.6*  --  10.6* 11.9*  HCT 35.3* 34.2*  < > 33.1* 32.4*  --  31.9* 38*  MCV 91.2 91.0  < > 92.7 91.5  --  89.9  --   PLT 251 215  < > 175 199  --  230 375  < > = values in this interval not displayed.     Assessment/Plan  Dysphagia High aspiration risk, continue mechanical soft diet, nectar thick liquid and aspiration precautions  Parkinson's dementia without behavioral disturbance With Parkinson's disease. Continue sinemet 25-100 mg 1.5 tab tid and aricept and monitor. Continue supportive care  Protein calorie malnutrition Continue fortified meals, mechanical soft diet   Cerebral microvasculopathy Noted on ct head with chronic microvascular ischemic changes. Continue  aspirin daily.   Family/ staff Communication: reviewed care plan with patient's wife and nursing supervisor    Oneal GroutMAHIMA Yoshiye Kraft, MD Internal Medicine [facility] [address] [place], [place] [address] Cell Phone (Monday-Friday 8 am - 5 pm): [phone] On Call: [phone] and follow prompts after 5 pm and on weekends Office Phone: [phone] Office Fax:  [phone]    

## 2016-06-06 ENCOUNTER — Non-Acute Institutional Stay (SKILLED_NURSING_FACILITY): Payer: Medicare Other | Admitting: Family

## 2016-06-06 DIAGNOSIS — G20 Parkinson's disease: Secondary | ICD-10-CM

## 2016-06-06 DIAGNOSIS — R1312 Dysphagia, oropharyngeal phase: Secondary | ICD-10-CM

## 2016-06-06 DIAGNOSIS — N400 Enlarged prostate without lower urinary tract symptoms: Secondary | ICD-10-CM

## 2016-06-06 NOTE — Progress Notes (Signed)
Location:  [facility] Room Number: 1105 B  Place of Service:  SNF (31) Provider: Richarda Blade, FNP-C  Lupe Carney, MD  Patient Care Team: L.Lupe Carney, MD as PCP - General (Family Medicine)  Extended Emergency Contact Information Primary Emergency Contact: Tudisco,[name] Address: [phone]          [place] [address] [name] Home Phone: [phone] Relation: None Secondary Emergency Contact: [facility] Address: [address]          [place], [place] [address] [name] of [place] Home Phone: [phone] Relation: Son  Code Status:  DNR Goals of care: Advanced Directive information Advanced Directives 04/04/2016  Does Patient Have a Medical Advance Directive? Yes  Type of Advance Directive Out of facility DNR (pink MOST or yellow form)  Does patient want to make changes to medical advance directive? No - Patient declined  Copy of Healthcare Power of Attorney in Chart? -  Would patient like information on creating a medical advance directive? -  Pre-existing out of facility DNR order (yellow form or pink MOST form) Pink MOST form placed in chart (order not valid for inpatient use)     Chief Complaint  Patient presents with  . Medical Management of Chronic Issues    HPI:  Pt is a 83 y.o. male seen today at [facility] for medical management of chronic diseases. 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 He is seen in his room today.He has had no recent fall episodes, weight loss or hospital admission.HPI and ROS due to presence of dementia.Facility staff report no new concerns this visit.     Past Medical History:  Diagnosis Date  . Hallucinations   . 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 Other vitamin B12 deficiency anemia   . Paralysis agitans (HCC)   . Parkinson's  disease (HCC) 03/27/2013  . Unspecified transient cerebral ischemia    Past Surgical History:  Procedure Laterality Date  . APPENDECTOMY    . CHOLECYSTECTOMY    . PTCA  1987  . TRANSURETHRAL RESECTION OF PROSTATE      No Known Allergies  Allergies as of 06/06/2016   No Known Allergies     Medication List       Accurate as of 06/06/16  6:18 PM. Always use your most recent med list.          AMBULATORY NON FORMULARY MEDICATION Magic Cup Sig: One cup by mouth every day with lunch meal for weight loss   aspirin 325 MG tablet Take 325 mg by mouth daily.   carbidopa-levodopa 25-100 MG tablet Commonly known as:  SINEMET IR 1.5 tablets by mouth three times daily. Dosing times : 1 hour before meals or 2 hours after meals.   donepezil 10 MG tablet Commonly known as:  ARICEPT Take 1 tablet by mouth at  bedtime   fish oil-omega-3 fatty acids 1000 MG capsule Take 1,000 mg by mouth daily.   ketoconazole 2 % shampoo Commonly known as:  NIZORAL Apply 1 application topically 2 (two) times a week. Wed/saturday 3-11 shift until resolved   MALTODEXTRIN-XANTHAN GUM PO Give 1gm by mouth as needed due to high risk for aspiration   multivitamin-lutein Caps capsule Take 1 capsule by mouth daily.   omeprazole 20 MG capsule Commonly known as:  PRILOSEC Take 20 mg by mouth daily.   oxybutynin 5 MG  tablet Commonly known as:  DITROPAN Take 1 tablet by mouth  nightly   OXYGEN Inhale 3 L into the lungs. 3 Lpm   TYLENOL 500 MG tablet Generic drug:  acetaminophen Take 500 mg by mouth every 6 (six) hours as needed for moderate pain.   Vitamin D-3 1000 units Caps Take 1 capsule by mouth daily.       Review of Systems  Unable to perform ROS: Dementia    Immunization History  Administered Date(s) Administered  . Influenza-Unspecified 02/21/2016  . PPD Test 09/05/2015, 09/20/2015   Pertinent  Health Maintenance Due  Topic Date Due  . PNA vac Low Risk Adult (1 of 2 - PCV13)  08/28/1994  . INFLUENZA VACCINE  Completed   Fall Risk  01/04/2016 12/31/2015  Falls in the past year? Yes Exclusion - non ambulatory  Number falls in past yr: 2 or more -  Injury with Fall? Yes -  Risk Factor Category  High Fall Risk -  Risk for fall due to : Impaired balance/gait;Impaired mobility -  Follow up Falls prevention discussed -    Vitals:   06/06/16 1045  BP: 120/69  Pulse: 80  Resp: 18  Temp: 98 F (36.7 C)  SpO2: 97%  Weight: 147 lb 11.2 oz (67 kg)  Height: 5\' 2"  (1.575 m)   Body mass index is 27.01 kg/m. Physical Exam  Constitutional: He appears well-developed and well-nourished. No distress.  Non-verbal during visit.   HENT:  Head: Normocephalic.  Mouth/Throat: Oropharynx is clear and moist.  Eyes: Conjunctivae and EOM are normal. Pupils are equal, round, and reactive to light. Right eye exhibits no discharge. No scleral icterus.  Neck: Neck supple. No JVD present. No thyromegaly present.  Cardiovascular: Normal rate, regular rhythm, normal heart sounds and intact distal pulses.  Exam reveals no gallop and no friction rub.   No murmur heard. Pulmonary/Chest: Effort normal and breath sounds normal. No respiratory distress. He has no wheezes.  Abdominal: Soft. Bowel sounds are normal. He exhibits no distension. There is no tenderness. There is no rebound and no guarding.  Genitourinary:  Genitourinary Comments: Incontinent  Musculoskeletal: He exhibits no edema, tenderness or deformity.  Wheelchair bound.Moves x 4 extremities.  Lymphadenopathy:    He has no cervical adenopathy.  Neurological: He is alert.  Nonverbal during visit  Skin: Skin is warm and dry. No rash noted. No erythema. No pallor.  Psychiatric: He has a normal mood and affect.    Labs reviewed:  Recent Labs  09/02/15 0455 09/03/15 0514 09/04/15 0513 09/05/15 09/05/15 0539 09/08/15  NA 143 144 139 142 142 141  K 4.3 4.6 4.2  --  4.0 4.6  CL 112* 111 106  --  108  --   CO2 24 24 22    --  26  --   GLUCOSE 100* 109* 84  --  106*  --   BUN 24* 18 15 14 14 14   CREATININE 1.05 0.84 0.84 0.8 0.83 0.9  CALCIUM 9.1 10.1 9.3  --  9.8  --   MG 1.9  --   --   --   --   --     Recent Labs  08/30/15 0450 08/31/15 2013 09/08/15  AST 20 33 38  ALT 14* 7* 27  ALKPHOS 54 61 69  BILITOT 0.9 0.6  --   PROT 6.0* 6.1*  --   ALBUMIN 3.3* 3.2*  --     Recent Labs  08/30/15 0450 08/31/15 2013  09/03/15 0514 09/04/15 0513 09/05/15 09/05/15 0539 09/08/15  WBC 8.5 16.0*  < > 7.5 6.0 5.2 5.2 7.3  NEUTROABS 5.5 14.2*  --   --   --   --   --   --   HGB 11.6* 11.4*  < > 10.8* 10.6*  --  10.6* 11.9*  HCT 35.3* 34.2*  < > 33.1* 32.4*  --  31.9* 38*  MCV 91.2 91.0  < > 92.7 91.5  --  89.9  --   PLT 251 215  < > 175 199  --  230 375  < > = values in this interval not displayed. Lab Results  Component Value Date   TSH 2.336 08/30/2015   Assessment/Plan  Dysphagia Continue to assist with all meals. Aspiration precautions.   Parkinson's disease Progressive decline expected. Continue sinemet 25-100 mg Tablet. Continue with Aspiration precautions.    BPH   continue on oxybutynin. Continue to monitor for urinary retention.   Family/ staff Communication: Reviewed plan of care with patient's wife and  facility Nurse supervisor.   Labs/tests ordered: None

## 2016-07-04 ENCOUNTER — Encounter: Payer: Self-pay | Admitting: Family

## 2016-07-04 ENCOUNTER — Non-Acute Institutional Stay (SKILLED_NURSING_FACILITY): Payer: Medicare Other | Admitting: Family

## 2016-07-04 DIAGNOSIS — R1312 Dysphagia, oropharyngeal phase: Secondary | ICD-10-CM | POA: Diagnosis not present

## 2016-07-04 DIAGNOSIS — K219 Gastro-esophageal reflux disease without esophagitis: Secondary | ICD-10-CM | POA: Diagnosis not present

## 2016-07-04 DIAGNOSIS — G20 Parkinson's disease: Secondary | ICD-10-CM | POA: Diagnosis not present

## 2016-07-04 DIAGNOSIS — N400 Enlarged prostate without lower urinary tract symptoms: Secondary | ICD-10-CM

## 2016-07-04 NOTE — Progress Notes (Signed)
Location:  [facility] Room Number: 1105-B Place of Service:  SNF (31) Provider: [name], FNP-C  [name], MD  Patient Care Team: L.[name], MD as PCP - General (Family Medicine)  Extended Emergency Contact Information Primary Emergency Contact: MichaelBetty Address: [phone]          [place] [phone] [place] Home Phone: [phone] Relation: None Secondary Emergency Contact: [name] Address: [address]          [place], [place] [id number] [place] Home Phone: [phone] Relation: Son  Code Status:  DNR Goals of care: Advanced Directive information Advanced Directives 04/04/2016  Does Patient Have a Medical Advance Directive? Yes  Type of Advance Directive Out of facility DNR (pink MOST or yellow form)  Does patient want to make changes to medical advance directive? No - Patient declined  Copy of Healthcare Power of Attorney in Chart? -  Would patient like information on creating a medical advance directive? -  Pre-existing out of facility DNR order (yellow form or pink MOST form) Pink MOST form placed in chart (order not valid for inpatient use)     Chief Complaint  Patient presents with  . Medical Management of Chronic Issues    Routine Visit     HPI:  Pt is a 83 y.o. male [facility] for medical management of chronic diseases. 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 He is seen in his room today.He is more alert today answered two to three questions appropriately and followed few simple commands. He denies any acute issues. No recent weight changes since prior visit. No acute illness or fall episodes. Facility staff report no new concerns behavioral issues. Patient's wife visit daily.      Past Medical History:  Diagnosis Date  . Hallucinations   . Other  persistent mental disorders due to conditions classified elsewhere   . Other vitamin B12 deficiency anemia   . Paralysis agitans (HCC)   . Parkinson's disease (HCC) 03/27/2013  . Unspecified transient cerebral ischemia    Past Surgical History:  Procedure Laterality Date  . APPENDECTOMY    . CHOLECYSTECTOMY    . PTCA  1987  . TRANSURETHRAL RESECTION OF PROSTATE      No Known Allergies  Allergies as of 07/04/2016   No Known Allergies     Medication List       Accurate as of 07/04/16 10:43 AM. Always use your most recent med list.          AMBULATORY NON FORMULARY MEDICATION Magic Cup Sig: One cup by mouth every day with lunch meal for weight loss   aspirin 325 MG tablet Take 325 mg by mouth daily.   carbidopa-levodopa 25-100 MG tablet Commonly known as:  SINEMET IR 1.5 tablets by mouth three times daily. Dosing times : 1 hour before meals or 2 hours after meals.   donepezil 10 MG tablet Commonly known as:  ARICEPT Take 1 tablet by mouth at  bedtime   fish oil-omega-3 fatty acids 1000 MG capsule Take 1,000 mg by mouth daily.   ketoconazole 2 % shampoo Commonly known as:  NIZORAL Apply 1 application topically 2 (two) times a week. Wed/saturday 3-11 shift until resolved   MALTODEXTRIN-XANTHAN GUM PO Give 1gm by mouth as needed due to high risk for aspiration   multivitamin-lutein Caps capsule Take 1 capsule by  mouth daily.   mupirocin nasal ointment 2 % Commonly known as:  BACTROBAN Place 1 application into the nose 3 (three) times daily.   omeprazole 20 MG capsule Commonly known as:  PRILOSEC Take 20 mg by mouth daily.   OXYGEN Inhale 3 L into the lungs. 3 Lpm   TYLENOL 500 MG tablet Generic drug:  acetaminophen Take 500 mg by mouth every 6 (six) hours as needed for moderate pain.   Vitamin D-3 1000 units Caps Take 1 capsule by mouth daily.       Review of Systems  Unable to perform ROS: Dementia    Immunization History  Administered Date(s)  Administered  . Influenza-Unspecified 02/21/2016  . PPD Test 09/05/2015, 09/20/2015   Pertinent  Health Maintenance Due  Topic Date Due  . PNA vac Low Risk Adult (1 of 2 - PCV13) 08/28/1994  . INFLUENZA VACCINE  Completed   Fall Risk  01/04/2016 12/31/2015  Falls in the past year? Yes Exclusion - non ambulatory  Number falls in past yr: 2 or more -  Injury with Fall? Yes -  Risk Factor Category  High Fall Risk -  Risk for fall due to : Impaired balance/gait;Impaired mobility -  Follow up Falls prevention discussed -    Vitals:   07/04/16 1033  BP: 125/62  Pulse: (!) 57  Temp: 98 F (36.7 C)  TempSrc: Oral  SpO2: 93%  Weight: 139 lb 14.4 oz (63.5 kg)  Height: 5\' 2"  (1.575 m)   Body mass index is 25.59 kg/m. Physical Exam  Constitutional: He appears well-developed and well-nourished. No distress.  More alert this visit. Answered 2-3 questions appropriately.   HENT:  Head: Normocephalic.  Mouth/Throat: Oropharynx is clear and moist. No oropharyngeal exudate.  Eyes: Conjunctivae and EOM are normal. Pupils are equal, round, and reactive to light. Right eye exhibits no discharge. No scleral icterus.  Neck: Neck supple. No JVD present. No thyromegaly present.  Cardiovascular: Normal rate, regular rhythm, normal heart sounds and intact distal pulses.  Exam reveals no gallop and no friction rub.   No murmur heard. Pulmonary/Chest: Effort normal and breath sounds normal. No respiratory distress. He has no wheezes.  Abdominal: Soft. Bowel sounds are normal. He exhibits no distension. There is no tenderness. There is no rebound and no guarding.  Genitourinary:  Genitourinary Comments: Incontinent  Musculoskeletal: He exhibits no edema, tenderness or deformity.  Moves x 4 extremities.requires assistance with wheelchair.   Lymphadenopathy:    He has no cervical adenopathy.  Neurological: He is alert.  More alert this visit. Answered 2-3 questions appropriately.   Skin: Skin is warm  and dry. No rash noted. No erythema. No pallor.  Skin intact   Psychiatric: He has a normal mood and affect.    Labs reviewed:  Recent Labs  09/02/15 0455 09/03/15 0514 09/04/15 0513 09/05/15 09/05/15 0539 09/08/15  NA 143 144 139 142 142 141  K 4.3 4.6 4.2  --  4.0 4.6  CL 112* 111 106  --  108  --   CO2 24 24 22   --  26  --   GLUCOSE 100* 109* 84  --  106*  --   BUN 24* 18 15 14 14 14   CREATININE 1.05 0.84 0.84 0.8 0.83 0.9  CALCIUM 9.1 10.1 9.3  --  9.8  --   MG 1.9  --   --   --   --   --     Recent Labs  08/30/15 0450  08/31/15 2013 09/08/15  AST 20 33 38  ALT 14* 7* 27  ALKPHOS 54 61 69  BILITOT 0.9 0.6  --   PROT 6.0* 6.1*  --   ALBUMIN 3.3* 3.2*  --     Recent Labs  08/30/15 0450 08/31/15 2013  09/03/15 0514 09/04/15 0513 09/05/15 09/05/15 0539 09/08/15  WBC 8.5 16.0*  < > 7.5 6.0 5.2 5.2 7.3  NEUTROABS 5.5 14.2*  --   --   --   --   --   --   HGB 11.6* 11.4*  < > 10.8* 10.6*  --  10.6* 11.9*  HCT 35.3* 34.2*  < > 33.1* 32.4*  --  31.9* 38*  MCV 91.2 91.0  < > 92.7 91.5  --  89.9  --   PLT 251 215  < > 175 199  --  230 375  < > = values in this interval not displayed. Lab Results  Component Value Date   TSH 2.336 08/30/2015   Assessment/Plan  Parkinson's disease Progressive decline expected. Continue sinemet 25-100 mg Tablet. Continue to assist with ADL's. Fall and safety precautions. Skin care.   Dysphagia Continue to assist with all meals.continue to follow up with facility speech therapy. Aspiration precautions.   GERD Stable. Continue on omeprazole daily.monitor mg level.     BPH   asymptomatic.continue on oxybutynin. Continue to monitor for urinary retention.   Family/ staff Communication: Reviewed plan of care with patient's wife and  facility Nurse supervisor.   Labs/tests ordered: None

## 2016-07-28 ENCOUNTER — Non-Acute Institutional Stay (SKILLED_NURSING_FACILITY): Payer: Medicare Other | Admitting: Family

## 2016-07-28 ENCOUNTER — Encounter: Payer: Self-pay | Admitting: Family

## 2016-07-28 DIAGNOSIS — R2681 Unsteadiness on feet: Secondary | ICD-10-CM

## 2016-07-28 DIAGNOSIS — G20 Parkinson's disease: Secondary | ICD-10-CM | POA: Diagnosis not present

## 2016-07-28 DIAGNOSIS — R1310 Dysphagia, unspecified: Secondary | ICD-10-CM | POA: Diagnosis not present

## 2016-07-28 NOTE — Progress Notes (Signed)
Location:  [facility] Room Number: 1105 B Place of Service:  SNF (31) Provider: Richarda Blade, FNP-C  Lupe Carney, MD  Patient Care Team: L.Lupe Carney, MD as PCP - General (Family Medicine)  Extended Emergency Contact Information Primary Emergency Contact: Aday,[name] Address: [phone]          [place] [address] [name] Home Phone: [phone] Relation: None Secondary Emergency Contact: [facility] Address: [address]          [place], [place] [address] [name] of [place] Home Phone: [phone] Relation: Son  Code Status:  DNR Goals of care: Advanced Directive information Advanced Directives 07/28/2016  Does Patient Have a Medical Advance Directive? Yes  Type of Advance Directive Out of facility DNR (pink MOST or yellow form)  Does patient want to make changes to medical advance directive? -  Copy of Healthcare Power of Attorney in Chart? -  Would patient like information on creating a medical advance directive? -  Pre-existing out of facility DNR order (yellow form or pink MOST form) Yellow form placed in chart (order not valid for inpatient use)     Chief Complaint  Patient presents with  . Medical Management of Chronic Issues    Routine Visit    HPI:  Pt is a 83 y.o. male seen today at [facility] for medical management of chronic diseases. 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 He is seen in his room today.He was treated 07/18/2016 with Zynthromax for recent Pneumonia. Patient's wife states he is doing much better.He has a had  One pound weight gain over one month. He has a good appetite.No recent fall episodes reported. He is confused at baseline unable to provide HPI and ROS. Facility staff report no new concerns behavioral issues.     Past Medical History:  Diagnosis Date  . Hallucinations   .  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 Other vitamin B12 deficiency anemia   . Paralysis agitans (HCC)   . Parkinson's disease (HCC) 03/27/2013  . Unspecified transient cerebral ischemia    Past Surgical History:  Procedure Laterality Date  . APPENDECTOMY    . CHOLECYSTECTOMY    . PTCA  1987  . TRANSURETHRAL RESECTION OF PROSTATE      No Known Allergies  Allergies as of 07/28/2016   No Known Allergies     Medication List       Accurate as of 07/28/16  6:01 PM. Always use your most recent med list.          AMBULATORY NON FORMULARY MEDICATION Magic Cup Sig: One cup by mouth every day with lunch meal for weight loss   aspirin 325 MG tablet Take 325 mg by mouth daily.   carbidopa-levodopa 25-100 MG tablet Commonly known as:  SINEMET IR 1.5 tablets by mouth three times daily. Dosing times : 1 hour before meals or 2 hours after meals.   donepezil 10 MG tablet Commonly known as:  ARICEPT Take 1 tablet by mouth at  bedtime   fish oil-omega-3 fatty acids 1000 MG capsule Take 1,000 mg by mouth daily.   ketoconazole 2 % shampoo Commonly known as:  NIZORAL Apply 1 application topically 2 (two) times a week. Wed/saturday 3-11 shift until resolved   MALTODEXTRIN-XANTHAN GUM PO Give 1gm by mouth as needed due to high risk for aspiration   multivitamin-lutein  Caps capsule Take 1 capsule by mouth daily.   mupirocin nasal ointment 2 % Commonly known as:  BACTROBAN Place 1 application into the nose 3 (three) times daily.   omeprazole 20 MG capsule Commonly known as:  PRILOSEC Take 20 mg by mouth daily.   OXYGEN Inhale 3 L into the lungs. 3 Lpm   TYLENOL 500 MG tablet Generic drug:  acetaminophen Take 500 mg by mouth every 6 (six) hours as needed for moderate pain.   Vitamin D-3 1000 units Caps Take 1 capsule by mouth daily.       Review of Systems  Unable to perform ROS: Dementia    Immunization History  Administered  Date(s) Administered  . Influenza-Unspecified 02/21/2016  . PPD Test 09/05/2015, 09/20/2015   Pertinent  Health Maintenance Due  Topic Date Due  . PNA vac Low Risk Adult (1 of 2 - PCV13) 08/28/1994  . INFLUENZA VACCINE  Completed   Fall Risk  01/04/2016 12/31/2015  Falls in the past year? Yes Exclusion - non ambulatory  Number falls in past yr: 2 or more -  Injury with Fall? Yes -  Risk Factor Category  High Fall Risk -  Risk for fall due to : Impaired balance/gait;Impaired mobility -  Follow up Falls prevention discussed -    Vitals:   07/28/16 1121  BP: (!) 100/58  Pulse: 66  Resp: 16  Temp: 98.6 F (37 C)  TempSrc: Oral  SpO2: 95%  Weight: 141 lb (64 kg)  Height: 5\' 2"  (1.575 m)   Body mass index is 25.79 kg/m. Physical Exam  Constitutional: He appears well-developed and well-nourished. No distress.  Confused at baseline  HENT:  Head: Normocephalic.  Mouth/Throat: Oropharynx is clear and moist. No oropharyngeal exudate.  Eyes: Conjunctivae and EOM are normal. Pupils are equal, round, and reactive to light. Right eye exhibits no discharge. No scleral icterus.  Neck: Neck supple. No JVD present. No thyromegaly present.  Cardiovascular: Normal rate, regular rhythm, normal heart sounds and intact distal pulses.  Exam reveals no gallop and no friction rub.   No murmur heard. Pulmonary/Chest: Effort normal and breath sounds normal. No respiratory distress. He has no wheezes.  Abdominal: Soft. Bowel sounds are normal. He exhibits no distension. There is no tenderness. There is no rebound and no guarding.  Genitourinary:  Genitourinary Comments: Incontinent  Musculoskeletal: He exhibits no edema, tenderness or deformity.  Moves x 4 extremities. Unsteady gait   Lymphadenopathy:    He has no cervical adenopathy.  Neurological: He is alert. Coordination abnormal.  Alert to self and person. Muscle slowness and rigidity noted.   Skin: Skin is warm and dry. No rash noted. No  erythema. No pallor.  Skin intact   Psychiatric: He has a normal mood and affect.    Labs reviewed:  Recent Labs  09/02/15 0455 09/03/15 0514 09/04/15 0513 09/05/15 09/05/15 0539 09/08/15  NA 143 144 139 142 142 141  K 4.3 4.6 4.2  --  4.0 4.6  CL 112* 111 106  --  108  --   CO2 24 24 22   --  26  --   GLUCOSE 100* 109* 84  --  106*  --   BUN 24* 18 15 14 14 14   CREATININE 1.05 0.84 0.84 0.8 0.83 0.9  CALCIUM 9.1 10.1 9.3  --  9.8  --   MG 1.9  --   --   --   --   --     Recent  Labs  08/30/15 0450 08/31/15 2013 09/08/15  AST 20 33 38  ALT 14* 7* 27  ALKPHOS 54 61 69  BILITOT 0.9 0.6  --   PROT 6.0* 6.1*  --   ALBUMIN 3.3* 3.2*  --     Recent Labs  08/30/15 0450 08/31/15 2013  09/03/15 0514 09/04/15 0513 09/05/15 09/05/15 0539 09/08/15  WBC 8.5 16.0*  < > 7.5 6.0 5.2 5.2 7.3  NEUTROABS 5.5 14.2*  --   --   --   --   --   --   HGB 11.6* 11.4*  < > 10.8* 10.6*  --  10.6* 11.9*  HCT 35.3* 34.2*  < > 33.1* 32.4*  --  31.9* 38*  MCV 91.2 91.0  < > 92.7 91.5  --  89.9  --   PLT 251 215  < > 175 199  --  230 375  < > = values in this interval not displayed. Lab Results  Component Value Date   TSH 2.336 08/30/2015   Assessment/Plan  Unsteady Gait  Continue with restorative therapy. Fall and safety precautions.   Parkinson's disease Progressive decline expected. Continue sinemet 25-100 mg Tablet. Continue to assist with ADL's. Fall and safety precautions. Skin care.   Dysphagia Continue to assist with all meals.continue to follow up with facility speech therapy. Aspiration precautions.    Family/ staff Communication: Reviewed plan of care with patient's wife and  facility Nurse supervisor.   Labs/tests ordered: None

## 2016-08-20 ENCOUNTER — Emergency Department (HOSPITAL_COMMUNITY): Payer: Medicare Other

## 2016-08-20 ENCOUNTER — Encounter (HOSPITAL_COMMUNITY): Payer: Self-pay | Admitting: Emergency Medicine

## 2016-08-20 ENCOUNTER — Inpatient Hospital Stay (HOSPITAL_COMMUNITY)
Admission: EM | Admit: 2016-08-20 | Discharge: 2016-08-24 | DRG: 963 | Disposition: A | Discharge status: Skilled Nursing Facility | Payer: Medicare Other | Attending: Internal Medicine | Admitting: Internal Medicine

## 2016-08-20 DIAGNOSIS — Z820 Family history of epilepsy and other diseases of the nervous system: Secondary | ICD-10-CM

## 2016-08-20 DIAGNOSIS — Z7189 Other specified counseling: Secondary | ICD-10-CM | POA: Diagnosis not present

## 2016-08-20 DIAGNOSIS — R0689 Other abnormalities of breathing: Secondary | ICD-10-CM | POA: Diagnosis not present

## 2016-08-20 DIAGNOSIS — Y92129 Unspecified place in nursing home as the place of occurrence of the external cause: Secondary | ICD-10-CM

## 2016-08-20 DIAGNOSIS — S72011A Unspecified intracapsular fracture of right femur, initial encounter for closed fracture: Secondary | ICD-10-CM | POA: Diagnosis present

## 2016-08-20 DIAGNOSIS — F0390 Unspecified dementia without behavioral disturbance: Secondary | ICD-10-CM | POA: Diagnosis present

## 2016-08-20 DIAGNOSIS — I615 Nontraumatic intracerebral hemorrhage, intraventricular: Secondary | ICD-10-CM | POA: Diagnosis not present

## 2016-08-20 DIAGNOSIS — Z7982 Long term (current) use of aspirin: Secondary | ICD-10-CM | POA: Diagnosis not present

## 2016-08-20 DIAGNOSIS — S72001A Fracture of unspecified part of neck of right femur, initial encounter for closed fracture: Secondary | ICD-10-CM

## 2016-08-20 DIAGNOSIS — S0181XA Laceration without foreign body of other part of head, initial encounter: Secondary | ICD-10-CM | POA: Diagnosis present

## 2016-08-20 DIAGNOSIS — Z66 Do not resuscitate: Secondary | ICD-10-CM | POA: Diagnosis present

## 2016-08-20 DIAGNOSIS — J690 Pneumonitis due to inhalation of food and vomit: Secondary | ICD-10-CM | POA: Diagnosis not present

## 2016-08-20 DIAGNOSIS — Z9861 Coronary angioplasty status: Secondary | ICD-10-CM | POA: Diagnosis not present

## 2016-08-20 DIAGNOSIS — H05231 Hemorrhage of right orbit: Secondary | ICD-10-CM | POA: Diagnosis not present

## 2016-08-20 DIAGNOSIS — K219 Gastro-esophageal reflux disease without esophagitis: Secondary | ICD-10-CM | POA: Diagnosis present

## 2016-08-20 DIAGNOSIS — S0990XA Unspecified injury of head, initial encounter: Secondary | ICD-10-CM

## 2016-08-20 DIAGNOSIS — Z23 Encounter for immunization: Secondary | ICD-10-CM | POA: Diagnosis present

## 2016-08-20 DIAGNOSIS — F0280 Dementia in other diseases classified elsewhere without behavioral disturbance: Secondary | ICD-10-CM | POA: Diagnosis present

## 2016-08-20 DIAGNOSIS — I629 Nontraumatic intracranial hemorrhage, unspecified: Secondary | ICD-10-CM | POA: Diagnosis not present

## 2016-08-20 DIAGNOSIS — Z515 Encounter for palliative care: Secondary | ICD-10-CM | POA: Diagnosis not present

## 2016-08-20 DIAGNOSIS — M25551 Pain in right hip: Secondary | ICD-10-CM

## 2016-08-20 DIAGNOSIS — Z9049 Acquired absence of other specified parts of digestive tract: Secondary | ICD-10-CM | POA: Diagnosis not present

## 2016-08-20 DIAGNOSIS — S0240CA Maxillary fracture, right side, initial encounter for closed fracture: Secondary | ICD-10-CM | POA: Diagnosis not present

## 2016-08-20 DIAGNOSIS — G20 Parkinson's disease: Secondary | ICD-10-CM | POA: Diagnosis present

## 2016-08-20 DIAGNOSIS — Z7401 Bed confinement status: Secondary | ICD-10-CM

## 2016-08-20 DIAGNOSIS — Z79899 Other long term (current) drug therapy: Secondary | ICD-10-CM

## 2016-08-20 DIAGNOSIS — S066X9A Traumatic subarachnoid hemorrhage with loss of consciousness of unspecified duration, initial encounter: Secondary | ICD-10-CM | POA: Diagnosis present

## 2016-08-20 DIAGNOSIS — S06369A Traumatic hemorrhage of cerebrum, unspecified, with loss of consciousness of unspecified duration, initial encounter: Secondary | ICD-10-CM | POA: Diagnosis present

## 2016-08-20 DIAGNOSIS — I619 Nontraumatic intracerebral hemorrhage, unspecified: Secondary | ICD-10-CM | POA: Diagnosis not present

## 2016-08-20 DIAGNOSIS — I609 Nontraumatic subarachnoid hemorrhage, unspecified: Secondary | ICD-10-CM | POA: Diagnosis not present

## 2016-08-20 DIAGNOSIS — R51 Headache: Secondary | ICD-10-CM | POA: Diagnosis present

## 2016-08-20 DIAGNOSIS — G20A1 Parkinson's disease without dyskinesia, without mention of fluctuations: Secondary | ICD-10-CM

## 2016-08-20 DIAGNOSIS — W19XXXA Unspecified fall, initial encounter: Secondary | ICD-10-CM | POA: Diagnosis present

## 2016-08-20 LAB — BASIC METABOLIC PANEL
Anion gap: 7 (ref 5–15)
BUN: 24 mg/dL — ABNORMAL HIGH (ref 6–20)
CO2: 28 mmol/L (ref 22–32)
Calcium: 10.2 mg/dL (ref 8.9–10.3)
Chloride: 109 mmol/L (ref 101–111)
Creatinine, Ser: 0.86 mg/dL (ref 0.61–1.24)
GFR calc Af Amer: >60 mL/min (ref >60)
GFR calc non Af Amer: >60 mL/min (ref >60)
Glucose, Bld: 117 mg/dL — ABNORMAL HIGH (ref 65–99)
Potassium: 3.9 mmol/L (ref 3.5–5.1)
Sodium: 144 mmol/L (ref 135–145)

## 2016-08-20 LAB — CBC WITH DIFFERENTIAL/PLATELET
Basophils Absolute: 0.1 10*3/uL (ref 0.0–0.1)
Basophils Relative: 0 %
EOS ABS: 0.3 10*3/uL (ref 0.0–0.7)
Eosinophils Relative: 3 %
HEMATOCRIT: 41.5 % (ref 39.0–52.0)
HEMOGLOBIN: 13.4 g/dL (ref 13.0–17.0)
LYMPHS ABS: 1.2 10*3/uL (ref 0.7–4.0)
LYMPHS PCT: 9 %
MCH: 29.3 pg (ref 26.0–34.0)
MCHC: 32.3 g/dL (ref 30.0–36.0)
MCV: 90.8 fL (ref 78.0–100.0)
MONOS PCT: 3 %
Monocytes Absolute: 0.4 10*3/uL (ref 0.1–1.0)
NEUTROS ABS: 10.6 10*3/uL — AB (ref 1.7–7.7)
NEUTROS PCT: 85 %
Platelets: 295 10*3/uL (ref 150–400)
RBC: 4.57 MIL/uL (ref 4.22–5.81)
RDW: 14 % (ref 11.5–15.5)
WBC: 12.5 10*3/uL — AB (ref 4.0–10.5)

## 2016-08-20 LAB — TYPE AND SCREEN
ABO/RH(D): A NEG
ANTIBODY SCREEN: NEGATIVE

## 2016-08-20 LAB — ABO/RH: ABO/RH(D): A NEG

## 2016-08-20 LAB — PROTIME-INR
INR: 0.93
PROTHROMBIN TIME: 12.5 s (ref 11.4–15.2)

## 2016-08-20 MED ORDER — SODIUM CHLORIDE 0.9 % IV BOLUS (SEPSIS)
500.0000 mL | Freq: Once | INTRAVENOUS | Status: AC
  Administered 2016-08-20: 500 mL via INTRAVENOUS

## 2016-08-20 MED ORDER — MORPHINE SULFATE (PF) 2 MG/ML IV SOLN
2.0000 mg | Freq: Once | INTRAVENOUS | Status: AC
  Administered 2016-08-20: 2 mg via INTRAVENOUS
  Filled 2016-08-20: qty 1

## 2016-08-20 MED ORDER — TETANUS-DIPHTH-ACELL PERTUSSIS 5-2.5-18.5 LF-MCG/0.5 IM SUSP
0.5000 mL | Freq: Once | INTRAMUSCULAR | Status: AC
  Administered 2016-08-20: 0.5 mL via INTRAMUSCULAR
  Filled 2016-08-20: qty 0.5

## 2016-08-20 MED ORDER — LIDOCAINE-EPINEPHRINE (PF) 2 %-1:200000 IJ SOLN
10.0000 mL | Freq: Once | INTRAMUSCULAR | Status: AC
  Administered 2016-08-20: 10 mL
  Filled 2016-08-20: qty 20

## 2016-08-20 MED ORDER — FENTANYL CITRATE (PF) 100 MCG/2ML IJ SOLN
50.0000 ug | Freq: Once | INTRAMUSCULAR | Status: AC
  Administered 2016-08-20: 50 ug via INTRAVENOUS
  Filled 2016-08-20: qty 2

## 2016-08-20 NOTE — ED Notes (Signed)
Patients family requesting tylenol for patient, provider notified and pain assessment completed.

## 2016-08-20 NOTE — ED Notes (Signed)
Bed: WA19 Expected date:  Expected time:  Means of arrival:  Comments: EMS 

## 2016-08-20 NOTE — ED Provider Notes (Addendum)
[facility]-EMERGENCY DEPT Provider Note   CSN: [id number] Arrival date & time: 08/20/16  1759     History   Chief Complaint Chief Complaint  Patient presents with  . Fall  . Head Laceration    HPI Michael [name] is a 83 y.o. male.  HPI   Pt with hx parkinson's disease presents after fall while at nursing home.  Pt has laceration to right forehead, complaining of pain in the right hip.  Pt has been mostly wheelchair bound but is doing physical therapy and patient's son has heard from his mother that pt has walked recently.  Has pneumonia a few weeks ago that has resolved.  No other recent illness or symptoms.    Son is bedside and gives most of the history.    Per staff at nursing home.  Pt was put in bed early at family's request.  Pt was restless and attempted to get up and walk.  He fell and hit his head on the footboard of the bed, then fell to the ground, having lost consciousness.  Per staff member pt is bedbound at baseline and spends the day in a "geri-chair" and is only in bed at night.  He does not walk at baseline.  He did get up and attempted to walk a few weeks ago and was caught by staff before he fell.  Level V caveat for dementia.    Past Medical History:  Diagnosis Date  . Hallucinations   . 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 Other vitamin B12 deficiency anemia   . Paralysis agitans (HCC)   . Parkinson's disease (HCC) 03/27/2013  . Unspecified transient cerebral ischemia     Patient Active Problem List   Diagnosis Date Noted  . Moderate protein-calorie malnutrition (HCC) 04/26/2016  . 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 BPH (benign prostatic hyperplasia) 12/31/2015  . GERD (gastroesophageal reflux disease) 12/31/2015  . Vitamin D deficiency 10/28/2015  . Facial droop   . Encounter for palliative care   . Goals of care, counseling/discussion   . Dysphagia 09/01/2015  . Dementia 09/01/2015  . Metabolic  encephalopathy 08/30/2015  . Fall 08/30/2015  . Failure to thrive (0-17) 08/30/2015  . Hyperammonemia (HCC) 08/30/2015  . Altered mental state 08/29/2015  . Parkinson's disease (HCC) 03/27/2013  . Other B-complex deficiencies 09/16/2012    Past Surgical History:  Procedure Laterality Date  . APPENDECTOMY    . CHOLECYSTECTOMY    . PTCA  1987  . TRANSURETHRAL RESECTION OF PROSTATE         Home Medications    Prior to Admission medications   Medication Sig Start Date End Date Taking? Authorizing Provider  acetaminophen (TYLENOL) 500 MG tablet Take 500 mg by mouth every 6 (six) hours as needed for moderate pain.    Yes Historical Provider, MD  AMBULATORY NON FORMULARY MEDICATION Magic Cup Sig: One cup by mouth every day with lunch meal for weight loss   Yes Historical Provider, MD  aspirin 325 MG tablet Take 325 mg by mouth daily.    Yes Historical Provider, MD  carbidopa-levodopa (SINEMET IR) 25-100 MG tablet 1.5 tablets by mouth three times daily. Dosing times : 1 hour before meals or 2 hours after meals.    Yes Historical Provider, MD  Cholecalciferol (VITAMIN D-3) 1000 UNITS CAPS Take 1 capsule by mouth daily.    Yes Historical Provider, MD  donepezil (ARICEPT) 10 MG tablet Take 1 tablet by mouth at  bedtime  07/15/15  Yes Huston Foley, MD  fish oil-omega-3 fatty acids 1000 MG capsule Take 1,000 mg by mouth daily.    Yes Historical Provider, MD  ketoconazole (NIZORAL) 2 % shampoo Apply 1 application topically 2 (two) times a week. Wed/saturday 3-11 shift until resolved   Yes Historical Provider, MD  MALTODEXTRIN-XANTHAN GUM PO Give 1gm by mouth as needed due to high risk for aspiration   Yes Historical Provider, MD  Multiple Vitamin (MULTIVITAMIN WITH MINERALS) TABS tablet Take 1 tablet by mouth daily.   Yes Historical Provider, MD  multivitamin-lutein (OCUVITE-LUTEIN) CAPS capsule Take 1 capsule by mouth daily.   Yes Historical Provider, MD  omeprazole (PRILOSEC) 20 MG capsule Take  20 mg by mouth daily.    Yes Historical Provider, MD    Family History Family History  Problem Relation Age of Onset  . Parkinson's disease Sister   . Cancer Brother   . Parkinson's disease Daughter 51    Social History Social History  Substance Use Topics  . Smoking status: Never Smoker  . Smokeless tobacco: Never Used  . Alcohol use No     Allergies   Patient has no known allergies.   Review of Systems Review of Systems  Unable to perform ROS: Dementia     Physical Exam Updated Vital Signs BP (!) 141/71 (BP Location: Left Arm)   Pulse 77   Temp 97.7 F (36.5 C) (Rectal)   Resp 18   SpO2 97%   Physical Exam  Constitutional: He appears well-developed. No distress.  HENT:  Head: Normocephalic.  Mouth/Throat: Mucous membranes are dry.  Right temple with vertical laceration, periorbital hematoma.    Neck: Neck supple.  Cardiovascular: Normal rate and regular rhythm.   Pulmonary/Chest: Effort normal and breath sounds normal. No respiratory distress. He has no wheezes. He has no rales. He exhibits no tenderness.  Abdominal: Soft. He exhibits no distension and no mass. There is no tenderness. There is no rebound and no guarding.  Musculoskeletal:  Right hip pain with passive ROM, no focal tenderness.    Neurological: He is alert. He exhibits normal muscle tone.  Skin: He is not diaphoretic.  Nursing note and vitals reviewed.    ED Treatments / Results  Labs (all labs ordered are listed, but only abnormal results are displayed) Labs Reviewed  BASIC METABOLIC PANEL - Abnormal; Notable for the following:       Result Value   Glucose, Bld 117 (*)    BUN 24 (*)    All other components within normal limits  CBC WITH DIFFERENTIAL/PLATELET - Abnormal; Notable for the following:    WBC 12.5 (*)    Neutro Abs 10.6 (*)    All other components within normal limits  PROTIME-INR  URINALYSIS, ROUTINE W REFLEX MICROSCOPIC  TYPE AND SCREEN  ABO/RH    EKG  EKG  Interpretation None       Radiology Ct Head Wo Contrast  Result Date: 08/20/2016 CLINICAL DATA:  Fall. Head, facial, and neck pain and swelling. Initial encounter. EXAM: CT HEAD WITHOUT CONTRAST CT MAXILLOFACIAL WITHOUT CONTRAST CT CERVICAL SPINE WITHOUT CONTRAST TECHNIQUE: Multidetector CT imaging of the head, cervical spine, and maxillofacial structures were performed using the standard protocol without intravenous contrast. Multiplanar CT image reconstructions of the cervical spine and maxillofacial structures were also generated. COMPARISON:  CT on 02/14/2016 and cervical spine CT on 08/29/2015 FINDINGS: CT HEAD FINDINGS Brain: 7 x 7 mm intraparenchymal hemorrhage seen in the right frontal lobe. Mild subarachnoid hemorrhage  is seen in the right frontal and temporal regions, and mild intraventricular hemorrhage is seen in the occipital horns bilaterally. Ventricles remain stable in size. No evidence of acute cerebral infarct, edema, mass effect, or midline shift. Mild to moderate diffuse cerebral atrophy again noted. Vascular: No hyperdense vessel or unexpected calcification. Skull: Normal. Negative for fracture or focal lesion. Other: None. CT MAXILLOFACIAL FINDINGS Osseous: Nondisplaced fracture is seen involving the lateral wall of the right maxillary sinus. No other are facial bone fractures are identified. Orbits: No evidence of fracture. Globes and other intraorbital anatomy are unremarkable. Sinuses: Right maxillary sinus air-fluid level. Soft tissues: Right frontal, maxillary, and preseptal soft tissue swelling is seen. CT CERVICAL SPINE FINDINGS Alignment: Normal. Skull base and vertebrae: No acute fracture. No primary bone lesion or focal pathologic process. Soft tissues and spinal canal: No prevertebral fluid or swelling. No visible canal hematoma. 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 Mild to moderate degenerative disc disease seen at C5-6. Mild to moderate facet DJD  present bilaterally at C4-5 and C5-6. Moderate atlantoaxial degenerative changes also seen. Upper chest: Negative. Other: None. IMPRESSION: 7 x 7 mm intraparenchymal hemorrhage in right frontal lobe. No evidence of mass effect or midline shift. Mild right frontal and temporal subarachnoid hemorrhage. Mild intraventricular hemorrhage in both occipital horns. No evidence of acute hydrocephalus. Nondisplaced fracture of the lateral wall of the right maxillary sinus. No evidence of acute cervical spine fracture or subluxation. Degenerative spondylosis and congenital fusion, as described above. Critical Value/emergent results were called by telephone at the time of interpretation on 08/20/2016 at 8:41 pm to Dr. Trixie Dredge , who verbally acknowledged these results. Electronically Signed   By: Myles Rosenthal M.D.   On: 08/20/2016 20:43   Ct Cervical Spine Wo Contrast  Result Date: 08/20/2016 CLINICAL DATA:  Fall. Head, facial, and neck pain and swelling. Initial encounter. EXAM: CT HEAD WITHOUT CONTRAST CT MAXILLOFACIAL WITHOUT CONTRAST CT CERVICAL SPINE WITHOUT CONTRAST TECHNIQUE: Multidetector CT imaging of the head, cervical spine, and maxillofacial structures were performed using the standard protocol without intravenous contrast. Multiplanar CT image reconstructions of the cervical spine and maxillofacial structures were also generated. COMPARISON:  CT on 02/14/2016 and cervical spine CT on 08/29/2015 FINDINGS: CT HEAD FINDINGS Brain: 7 x 7 mm intraparenchymal hemorrhage seen in the right frontal lobe. Mild subarachnoid hemorrhage is seen in the right frontal and temporal regions, and mild intraventricular hemorrhage is seen in the occipital horns bilaterally. Ventricles remain stable in size. No evidence of acute cerebral infarct, edema, mass effect, or midline shift. Mild to moderate diffuse cerebral atrophy again noted. Vascular: No hyperdense vessel or unexpected calcification. Skull: Normal. Negative for fracture  or focal lesion. Other: None. CT MAXILLOFACIAL FINDINGS Osseous: Nondisplaced fracture is seen involving the lateral wall of the right maxillary sinus. No other are facial bone fractures are identified. Orbits: No evidence of fracture. Globes and other intraorbital anatomy are unremarkable. Sinuses: Right maxillary sinus air-fluid level. Soft tissues: Right frontal, maxillary, and preseptal soft tissue swelling is seen. CT CERVICAL SPINE FINDINGS Alignment: Normal. Skull base and vertebrae: No acute fracture. No primary bone lesion or focal pathologic process. Soft tissues and spinal canal: No prevertebral fluid or swelling. No visible canal hematoma. 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 Mild to moderate degenerative disc disease seen at C5-6. Mild to moderate facet DJD present bilaterally at C4-5 and C5-6. Moderate atlantoaxial degenerative changes also seen. Upper chest: Negative. Other: None. IMPRESSION:  7 x 7 mm intraparenchymal hemorrhage in right frontal lobe. No evidence of mass effect or midline shift. Mild right frontal and temporal subarachnoid hemorrhage. Mild intraventricular hemorrhage in both occipital horns. No evidence of acute hydrocephalus. Nondisplaced fracture of the lateral wall of the right maxillary sinus. No evidence of acute cervical spine fracture or subluxation. Degenerative spondylosis and congenital fusion, as described above. Critical Value/emergent results were called by telephone at the time of interpretation on 08/20/2016 at 8:41 pm to Dr. Trixie Dredge , who verbally acknowledged these results. Electronically Signed   By: Myles Rosenthal M.D.   On: 08/20/2016 20:43   Dg Chest Port 1 View  Result Date: 08/20/2016 CLINICAL DATA:  Fall.  Right hip fracture.  Pre-op respiratory exam EXAM: PORTABLE CHEST 1 VIEW COMPARISON:  08/31/2015 FINDINGS: Heart size remains normal. Aortic atherosclerosis. Low lung volumes again noted. Scarring again seen in left lung base. No  evidence of pulmonary infiltrate or edema. No evidence of pneumothorax or pleural effusion. Several old right rib fracture deformities again noted. IMPRESSION: Stable left basilar scarring.  No active lung disease. Aortic atherosclerosis. Electronically Signed   By: Myles Rosenthal M.D.   On: 08/20/2016 20:12   Dg Hip Unilat W Or Wo Pelvis 2-3 Views Right  Result Date: 08/20/2016 CLINICAL DATA:  Fall.  Right hip pain.  Initial encounter. EXAM: DG HIP (WITH OR WITHOUT PELVIS) 2-3V RIGHT COMPARISON:  None. FINDINGS: Impacted subcapital right femoral neck fracture is seen. No evidence of dislocation. Generalized osteopenia. IMPRESSION: Impacted subcapital right femoral neck fracture. Electronically Signed   By: Myles Rosenthal M.D.   On: 08/20/2016 20:10   Ct Maxillofacial Wo Cm  Result Date: 08/20/2016 CLINICAL DATA:  Fall. Head, facial, and neck pain and swelling. Initial encounter. EXAM: CT HEAD WITHOUT CONTRAST CT MAXILLOFACIAL WITHOUT CONTRAST CT CERVICAL SPINE WITHOUT CONTRAST TECHNIQUE: Multidetector CT imaging of the head, cervical spine, and maxillofacial structures were performed using the standard protocol without intravenous contrast. Multiplanar CT image reconstructions of the cervical spine and maxillofacial structures were also generated. COMPARISON:  CT on 02/14/2016 and cervical spine CT on 08/29/2015 FINDINGS: CT HEAD FINDINGS Brain: 7 x 7 mm intraparenchymal hemorrhage seen in the right frontal lobe. Mild subarachnoid hemorrhage is seen in the right frontal and temporal regions, and mild intraventricular hemorrhage is seen in the occipital horns bilaterally. Ventricles remain stable in size. No evidence of acute cerebral infarct, edema, mass effect, or midline shift. Mild to moderate diffuse cerebral atrophy again noted. Vascular: No hyperdense vessel or unexpected calcification. Skull: Normal. Negative for fracture or focal lesion. Other: None. CT MAXILLOFACIAL FINDINGS Osseous: Nondisplaced fracture  is seen involving the lateral wall of the right maxillary sinus. No other are facial bone fractures are identified. Orbits: No evidence of fracture. Globes and other intraorbital anatomy are unremarkable. Sinuses: Right maxillary sinus air-fluid level. Soft tissues: Right frontal, maxillary, and preseptal soft tissue swelling is seen. CT CERVICAL SPINE FINDINGS Alignment: Normal. Skull base and vertebrae: No acute fracture. No primary bone lesion or focal pathologic process. Soft tissues and spinal canal: No prevertebral fluid or swelling. No visible canal hematoma. 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 Mild to moderate degenerative disc disease seen at C5-6. Mild to moderate facet DJD present bilaterally at C4-5 and C5-6. Moderate atlantoaxial degenerative changes also seen. Upper chest: Negative. Other: None. IMPRESSION: 7 x 7 mm intraparenchymal hemorrhage in right frontal lobe. No evidence of mass effect or midline shift. Mild right frontal and  temporal subarachnoid hemorrhage. Mild intraventricular hemorrhage in both occipital horns. No evidence of acute hydrocephalus. Nondisplaced fracture of the lateral wall of the right maxillary sinus. No evidence of acute cervical spine fracture or subluxation. Degenerative spondylosis and congenital fusion, as described above. Critical Value/emergent results were called by telephone at the time of interpretation on 08/20/2016 at 8:41 pm to Dr. Trixie Dredge , who verbally acknowledged these results. Electronically Signed   By: Myles Rosenthal M.D.   On: 08/20/2016 20:43    Procedures Procedures (including critical care time)    LACERATION REPAIR Performed by: Trixie Dredge Authorized by: Trixie Dredge Consent: Verbal consent obtained. Risks and benefits: risks, benefits and alternatives were discussed Consent given by: patient Patient identity confirmed: provided demographic data Prepped and Draped in normal sterile fashion Wound  explored  Laceration Location: right face  Laceration Length: 8 cm  No Foreign Bodies seen or palpated  Anesthesia: local infiltration  Local anesthetic: lidocaine 2% with epinephrine  Anesthetic total: 7 ml  Irrigation method: syringe Amount of cleaning: standard  Skin closure: 4-0 vicryl  Number of sutures: 9  Technique: simple interrupted  Patient tolerance: Patient tolerated the procedure well with no immediate complications.   Medications Ordered in ED Medications  sodium chloride 0.9 % bolus 500 mL (0 mLs Intravenous Stopped 08/20/16 2132)  lidocaine-EPINEPHrine (XYLOCAINE W/EPI) 2 %-1:200000 (PF) injection 10 mL (10 mLs Infiltration Given 08/20/16 2029)  Tdap (BOOSTRIX) injection 0.5 mL (0.5 mLs Intramuscular Given 08/20/16 2029)  fentaNYL (SUBLIMAZE) injection 50 mcg (50 mcg Intravenous Given 08/20/16 2132)  morphine 2 MG/ML injection 2 mg (2 mg Intravenous Given 08/20/16 2233)     Initial Impression / Assessment and Plan / ED Course  I have reviewed the triage vital signs and the nursing notes.  Pertinent labs & imaging results that were available during my care of the patient were reviewed by me and considered in my medical decision making (see chart for details).    Pt with Parkinson's disease who is bedbound at [facility], has DNR in place, got up and fell today at his rehab facility, striking his head on the bed and losing consciousness.  Dr Ethelda Chick involved in patient's care promptly after I saw and initiated workup.  Hip xray demonstrates right subcapital right femoral neck fracture.  CT maxillofacial demonstrates nondisplaced lateral maxillary sinus fracture.  CT head demonstrates multiple small areas of hemorrhage.  I spoke with Dr Newell Coral, neurosurgery, who has seen the images.  He recommends admission to [facility], to be seen by neurosurgery in the morning, likely repeat CT Tuesday morning.  Nothing surgical to do at this point.  Critically, pt is to not be put on blood  thinners for VTE prophylaxis and will need SCDs only.    I spoke with Dr Eulah Pont regarding the patient's hip fracture.  He will put patient on his list to consult in the morning.  Recommends consideration for operation for pain control if patient's family would like to move forward with this.   Patient's injuries and discussions with consultants discussed with patient's family by me and Dr Ethelda Chick.  Patient's son requests admission to the hospital.  Per neurosurgery and orthopedics, patient to be admitted to [facility].  Dr Ethelda Chick to discuss with trauma surgery.  Facial laceration repaired in ED by me with hemostasis.  Pt left in care of Dr Ethelda Chick at change of shift to admit and transfer to [facility].   Final Clinical Impressions(s) / ED Diagnoses   Final diagnoses:  Fall, initial encounter  Closed fracture of neck of right femur, initial encounter (HCC)  Periorbital hematoma of right eye  Facial laceration, initial encounter  Intraparenchymal hemorrhage of brain (HCC)  Subarachnoid hemorrhage (HCC)  Intraventricular hemorrhage (HCC)  Closed fracture of right side of maxilla, initial encounter (HCC)  Parkinson's disease (HCC)  DNR (do not resuscitate)    New Prescriptions New Prescriptions   No medications on file     [place], PA-C 08/20/16 2244    Doug Sou, MD 08/21/16 0046    Trixie Dredge, PA-C 08/21/16 1255    Doug Sou, MD 08/30/16 [phone]

## 2016-08-20 NOTE — ED Provider Notes (Addendum)
Level V caveat dementia Patient reportedly had ground-level fall after he attempted to get out of bed earlier today. Striking his head. Suffered laceration to his left frontal area as a result of event. On exam patient is chronically ill appearing. HEENT exam with laceration at right temporal area. Mucous members dry. Chest is nontender abdomen nontender pelvis tender at right hip. All other extremities without contusion or tenderness. Neurovascular intact. Neurologic moves all extremities follow simple commands. CT scans reviewed. Patient has right-sided subcapital hip fracture and intraparenchymal cerebral bleed. I consulted Dr.Nudelman who requests transfer to [facility]. We have also consulted Dr. Wandra Feinstein who will see patient in hospital. Family is undecided whether or not they would want hip surgery however they do wish that he be hospitalized for observation. I spoke with Dr. Derrell Lolling from the trauma service who declined admission and requests admission to medical team. I have had a long discussion with patient's son. Feel the palate care consult in order. The patient is likely candidate for hospice. ConsultedOpyd from hospitalist service who will arrange for transfer to [facility]. Dr. Newell Coral  Results for orders placed or performed during the hospital encounter of 08/20/16  Basic metabolic panel  Result Value Ref Range   Sodium 144 135 - 145 mmol/L   Potassium 3.9 3.5 - 5.1 mmol/L   Chloride 109 101 - 111 mmol/L   CO2 28 22 - 32 mmol/L   Glucose, Bld 117 (H) 65 - 99 mg/dL   BUN 24 (H) 6 - 20 mg/dL   Creatinine, Ser 1.61 0.61 - 1.24 mg/dL   Calcium 09.6 8.9 - 04.5 mg/dL   GFR calc non Af Amer >60 >60 mL/min   GFR calc Af Amer >60 >60 mL/min   Anion gap 7 5 - 15  CBC WITH DIFFERENTIAL  Result Value Ref Range   WBC 12.5 (H) 4.0 - 10.5 K/uL   RBC 4.57 4.22 - 5.81 MIL/uL   Hemoglobin 13.4 13.0 - 17.0 g/dL   HCT 40.9 81.1 - 91.4 %   MCV 90.8 78.0 - 100.0 fL   MCH 29.3 26.0  - 34.0 pg   MCHC 32.3 30.0 - 36.0 g/dL   RDW 78.2 95.6 - 21.3 %   Platelets 295 150 - 400 K/uL   Neutrophils Relative % 85 %   Neutro Abs 10.6 (H) 1.7 - 7.7 K/uL   Lymphocytes Relative 9 %   Lymphs Abs 1.2 0.7 - 4.0 K/uL   Monocytes Relative 3 %   Monocytes Absolute 0.4 0.1 - 1.0 K/uL   Eosinophils Relative 3 %   Eosinophils Absolute 0.3 0.0 - 0.7 K/uL   Basophils Relative 0 %   Basophils Absolute 0.1 0.0 - 0.1 K/uL  Protime-INR  Result Value Ref Range   Prothrombin Time 12.5 11.4 - 15.2 seconds   INR 0.93   Type and screen [facility]  Result Value Ref Range   ABO/RH(D) A NEG    Antibody Screen NEG    Sample Expiration 08/23/2016   ABO/Rh  Result Value Ref Range   ABO/RH(D) A NEG    Ct Head Wo Contrast  Result Date: 08/20/2016 CLINICAL DATA:  Fall. Head, facial, and neck pain and swelling. Initial encounter. EXAM: CT HEAD WITHOUT CONTRAST CT MAXILLOFACIAL WITHOUT CONTRAST CT CERVICAL SPINE WITHOUT CONTRAST TECHNIQUE: Multidetector CT imaging of the head, cervical spine, and maxillofacial structures were performed using the standard protocol without intravenous contrast. Multiplanar CT image reconstructions of the cervical spine and  maxillofacial structures were also generated. COMPARISON:  CT on 02/14/2016 and cervical spine CT on 08/29/2015 FINDINGS: CT HEAD FINDINGS Brain: 7 x 7 mm intraparenchymal hemorrhage seen in the right frontal lobe. Mild subarachnoid hemorrhage is seen in the right frontal and temporal regions, and mild intraventricular hemorrhage is seen in the occipital horns bilaterally. Ventricles remain stable in size. No evidence of acute cerebral infarct, edema, mass effect, or midline shift. Mild to moderate diffuse cerebral atrophy again noted. Vascular: No hyperdense vessel or unexpected calcification. Skull: Normal. Negative for fracture or focal lesion. Other: None. CT MAXILLOFACIAL FINDINGS Osseous: Nondisplaced fracture is seen involving the  lateral wall of the right maxillary sinus. No other are facial bone fractures are identified. Orbits: No evidence of fracture. Globes and other intraorbital anatomy are unremarkable. Sinuses: Right maxillary sinus air-fluid level. Soft tissues: Right frontal, maxillary, and preseptal soft tissue swelling is seen. CT CERVICAL SPINE FINDINGS Alignment: Normal. Skull base and vertebrae: No acute fracture. No primary bone lesion or focal pathologic process. Soft tissues and spinal canal: No prevertebral fluid or swelling. No visible canal hematoma. 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 Mild to moderate degenerative disc disease seen at C5-6. Mild to moderate facet DJD present bilaterally at C4-5 and C5-6. Moderate atlantoaxial degenerative changes also seen. Upper chest: Negative. Other: None. IMPRESSION: 7 x 7 mm intraparenchymal hemorrhage in right frontal lobe. No evidence of mass effect or midline shift. Mild right frontal and temporal subarachnoid hemorrhage. Mild intraventricular hemorrhage in both occipital horns. No evidence of acute hydrocephalus. Nondisplaced fracture of the lateral wall of the right maxillary sinus. No evidence of acute cervical spine fracture or subluxation. Degenerative spondylosis and congenital fusion, as described above. Critical Value/emergent results were called by telephone at the time of interpretation on 08/20/2016 at 8:41 pm to Dr. Trixie Dredge , who verbally acknowledged these results. Electronically Signed   By: Myles Rosenthal M.D.   On: 08/20/2016 20:43   Ct Cervical Spine Wo Contrast  Result Date: 08/20/2016 CLINICAL DATA:  Fall. Head, facial, and neck pain and swelling. Initial encounter. EXAM: CT HEAD WITHOUT CONTRAST CT MAXILLOFACIAL WITHOUT CONTRAST CT CERVICAL SPINE WITHOUT CONTRAST TECHNIQUE: Multidetector CT imaging of the head, cervical spine, and maxillofacial structures were performed using the standard protocol without intravenous contrast.  Multiplanar CT image reconstructions of the cervical spine and maxillofacial structures were also generated. COMPARISON:  CT on 02/14/2016 and cervical spine CT on 08/29/2015 FINDINGS: CT HEAD FINDINGS Brain: 7 x 7 mm intraparenchymal hemorrhage seen in the right frontal lobe. Mild subarachnoid hemorrhage is seen in the right frontal and temporal regions, and mild intraventricular hemorrhage is seen in the occipital horns bilaterally. Ventricles remain stable in size. No evidence of acute cerebral infarct, edema, mass effect, or midline shift. Mild to moderate diffuse cerebral atrophy again noted. Vascular: No hyperdense vessel or unexpected calcification. Skull: Normal. Negative for fracture or focal lesion. Other: None. CT MAXILLOFACIAL FINDINGS Osseous: Nondisplaced fracture is seen involving the lateral wall of the right maxillary sinus. No other are facial bone fractures are identified. Orbits: No evidence of fracture. Globes and other intraorbital anatomy are unremarkable. Sinuses: Right maxillary sinus air-fluid level. Soft tissues: Right frontal, maxillary, and preseptal soft tissue swelling is seen. CT CERVICAL SPINE FINDINGS Alignment: Normal. Skull base and vertebrae: No acute fracture. No primary bone lesion or focal pathologic process. Soft tissues and spinal canal: No prevertebral fluid or swelling. No visible canal hematoma. Disc levels: Congenital fusion seen at  C2-3, C3-4, and C6-7. Mild to moderate degenerative disc disease seen at C5-6. Mild to moderate facet DJD present bilaterally at C4-5 and C5-6. Moderate atlantoaxial degenerative changes also seen. Upper chest: Negative. Other: None. IMPRESSION: 7 x 7 mm intraparenchymal hemorrhage in right frontal lobe. No evidence of mass effect or midline shift. Mild right frontal and temporal subarachnoid hemorrhage. Mild intraventricular hemorrhage in both occipital horns. No evidence of acute hydrocephalus. Nondisplaced fracture of the lateral wall of  the right maxillary sinus. No evidence of acute cervical spine fracture or subluxation. Degenerative spondylosis and congenital fusion, as described above. Critical Value/emergent results were called by telephone at the time of interpretation on 08/20/2016 at 8:41 pm to Dr. Trixie Dredge , who verbally acknowledged these results. Electronically Signed   By: Myles Rosenthal M.D.   On: 08/20/2016 20:43   Dg Chest Port 1 View  Result Date: 08/20/2016 CLINICAL DATA:  Fall.  Right hip fracture.  Pre-op respiratory exam EXAM: PORTABLE CHEST 1 VIEW COMPARISON:  08/31/2015 FINDINGS: Heart size remains normal. Aortic atherosclerosis. Low lung volumes again noted. Scarring again seen in left lung base. No evidence of pulmonary infiltrate or edema. No evidence of pneumothorax or pleural effusion. Several old right rib fracture deformities again noted. IMPRESSION: Stable left basilar scarring.  No active lung disease. Aortic atherosclerosis. Electronically Signed   By: Myles Rosenthal M.D.   On: 08/20/2016 20:12   Dg Hip Unilat W Or Wo Pelvis 2-3 Views Right  Result Date: 08/20/2016 CLINICAL DATA:  Fall.  Right hip pain.  Initial encounter. EXAM: DG HIP (WITH OR WITHOUT PELVIS) 2-3V RIGHT COMPARISON:  None. FINDINGS: Impacted subcapital right femoral neck fracture is seen. No evidence of dislocation. Generalized osteopenia. IMPRESSION: Impacted subcapital right femoral neck fracture. Electronically Signed   By: Myles Rosenthal M.D.   On: 08/20/2016 20:10   Ct Maxillofacial Wo Cm  Result Date: 08/20/2016 CLINICAL DATA:  Fall. Head, facial, and neck pain and swelling. Initial encounter. EXAM: CT HEAD WITHOUT CONTRAST CT MAXILLOFACIAL WITHOUT CONTRAST CT CERVICAL SPINE WITHOUT CONTRAST TECHNIQUE: Multidetector CT imaging of the head, cervical spine, and maxillofacial structures were performed using the standard protocol without intravenous contrast. Multiplanar CT image reconstructions of the cervical spine and maxillofacial structures  were also generated. COMPARISON:  CT on 02/14/2016 and cervical spine CT on 08/29/2015 FINDINGS: CT HEAD FINDINGS Brain: 7 x 7 mm intraparenchymal hemorrhage seen in the right frontal lobe. Mild subarachnoid hemorrhage is seen in the right frontal and temporal regions, and mild intraventricular hemorrhage is seen in the occipital horns bilaterally. Ventricles remain stable in size. No evidence of acute cerebral infarct, edema, mass effect, or midline shift. Mild to moderate diffuse cerebral atrophy again noted. Vascular: No hyperdense vessel or unexpected calcification. Skull: Normal. Negative for fracture or focal lesion. Other: None. CT MAXILLOFACIAL FINDINGS Osseous: Nondisplaced fracture is seen involving the lateral wall of the right maxillary sinus. No other are facial bone fractures are identified. Orbits: No evidence of fracture. Globes and other intraorbital anatomy are unremarkable. Sinuses: Right maxillary sinus air-fluid level. Soft tissues: Right frontal, maxillary, and preseptal soft tissue swelling is seen. CT CERVICAL SPINE FINDINGS Alignment: Normal. Skull base and vertebrae: No acute fracture. No primary bone lesion or focal pathologic process. Soft tissues and spinal canal: No prevertebral fluid or swelling. No visible canal hematoma. 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 Mild to moderate degenerative disc disease seen at C5-6. Mild to moderate facet DJD present bilaterally at C4-5  and C5-6. Moderate atlantoaxial degenerative changes also seen. Upper chest: Negative. Other: None. IMPRESSION: 7 x 7 mm intraparenchymal hemorrhage in right frontal lobe. No evidence of mass effect or midline shift. Mild right frontal and temporal subarachnoid hemorrhage. Mild intraventricular hemorrhage in both occipital horns. No evidence of acute hydrocephalus. Nondisplaced fracture of the lateral wall of the right maxillary sinus. No evidence of acute cervical spine fracture or subluxation.  Degenerative spondylosis and congenital fusion, as described above. Critical Value/emergent results were called by telephone at the time of interpretation on 08/20/2016 at 8:41 pm to Dr. Trixie Dredge , who verbally acknowledged these results. Electronically Signed   By: Myles Rosenthal M.D.   On: 08/20/2016 20:43  Laceration at right temporal area was repaired by Ms. West. Sutures should come out in 5 days  CRITICAL CARE Performed by: Doug Sou Total critical care time: 30 minutes Critical care time was exclusive of separately billable procedures and treating other patients. Critical care was necessary to treat or prevent imminent or life-threatening deterioration. Critical care was time spent personally by me on the following activities: development of treatment plan with patient and/or surrogate as well as nursing, discussions with consultants, evaluation of patient's response to treatment, examination of patient, obtaining history from patient or surrogate, ordering and performing treatments and interventions, ordering and review of laboratory studies, ordering and review of radiographic studies, pulse oximetry and re-evaluation of patient's condition. Doug Sou, MD 08/20/16 0454    Doug Sou, MD 08/20/16 2300

## 2016-08-20 NOTE — ED Triage Notes (Addendum)
Per [facility] pt from [facility] with hx of parkinson's and dementia attempted to get up today and fell. Nurse at facility immediately went into room. Reports no LOC. No blood thinners. 2 inch laceration noted to right side. Bleeding not controlled, pressure dressing applied.

## 2016-08-21 ENCOUNTER — Encounter (HOSPITAL_COMMUNITY): Payer: Self-pay | Admitting: Certified Registered Nurse Anesthetist

## 2016-08-21 ENCOUNTER — Encounter (HOSPITAL_COMMUNITY)
Admission: EM | Disposition: A | Discharge status: Skilled Nursing Facility | Payer: Self-pay | Source: Home / Self Care | Attending: Family Medicine

## 2016-08-21 DIAGNOSIS — I615 Nontraumatic intracerebral hemorrhage, intraventricular: Secondary | ICD-10-CM | POA: Insufficient documentation

## 2016-08-21 DIAGNOSIS — I629 Nontraumatic intracranial hemorrhage, unspecified: Secondary | ICD-10-CM

## 2016-08-21 DIAGNOSIS — S72001A Fracture of unspecified part of neck of right femur, initial encounter for closed fracture: Secondary | ICD-10-CM | POA: Insufficient documentation

## 2016-08-21 LAB — BASIC METABOLIC PANEL
ANION GAP: 11 (ref 5–15)
BUN: 23 mg/dL — ABNORMAL HIGH (ref 6–20)
CHLORIDE: 109 mmol/L (ref 101–111)
CO2: 21 mmol/L — ABNORMAL LOW (ref 22–32)
Calcium: 9.6 mg/dL (ref 8.9–10.3)
Creatinine, Ser: 0.99 mg/dL (ref 0.61–1.24)
GFR calc Af Amer: >60 mL/min (ref >60)
Glucose, Bld: 126 mg/dL — ABNORMAL HIGH (ref 65–99)
POTASSIUM: 4.4 mmol/L (ref 3.5–5.1)
Sodium: 141 mmol/L (ref 135–145)

## 2016-08-21 LAB — URINALYSIS, ROUTINE W REFLEX MICROSCOPIC
BILIRUBIN URINE: NEGATIVE
GLUCOSE, UA: NEGATIVE mg/dL
KETONES UR: NEGATIVE mg/dL
Nitrite: NEGATIVE
Protein, ur: NEGATIVE mg/dL
Specific Gravity, Urine: 1.021 (ref 1.005–1.030)
pH: 5 (ref 5.0–8.0)

## 2016-08-21 LAB — TYPE AND SCREEN
ABO/RH(D): A NEG
Antibody Screen: NEGATIVE

## 2016-08-21 LAB — CBC
HEMATOCRIT: 39.6 % (ref 39.0–52.0)
HEMOGLOBIN: 12.6 g/dL — AB (ref 13.0–17.0)
MCH: 29.6 pg (ref 26.0–34.0)
MCHC: 31.8 g/dL (ref 30.0–36.0)
MCV: 93 fL (ref 78.0–100.0)
PLATELETS: 233 10*3/uL (ref 150–400)
RBC: 4.26 MIL/uL (ref 4.22–5.81)
RDW: 14.5 % (ref 11.5–15.5)
WBC: 16.6 10*3/uL — AB (ref 4.0–10.5)

## 2016-08-21 LAB — ABO/RH: ABO/RH(D): A NEG

## 2016-08-21 LAB — TROPONIN I: Troponin I: <0.03 ng/mL (ref <0.03)

## 2016-08-21 LAB — MRSA PCR SCREENING: MRSA BY PCR: NEGATIVE

## 2016-08-21 SURGERY — FIXATION, FEMUR, NECK, PERCUTANEOUS, USING SCREW
Anesthesia: General | Laterality: Right

## 2016-08-21 MED ORDER — HYDRALAZINE HCL 20 MG/ML IJ SOLN: 5.0000 mg | INTRAMUSCULAR | Status: DC | PRN

## 2016-08-21 MED ORDER — ADULT MULTIVITAMIN W/MINERALS CH: 1.0000 | ORAL_TABLET | Freq: Every day | ORAL | Status: DC

## 2016-08-21 MED ORDER — OMEGA-3-ACID ETHYL ESTERS 1 G PO CAPS
1000.0000 mg | ORAL_CAPSULE | Freq: Every day | ORAL | Status: DC
  Filled 2016-08-21: qty 1

## 2016-08-21 MED ORDER — FENTANYL CITRATE (PF) 250 MCG/5ML IJ SOLN
INTRAMUSCULAR | Status: AC
  Filled 2016-08-21: qty 5

## 2016-08-21 MED ORDER — CARBIDOPA-LEVODOPA 25-100 MG PO TABS
1.5000 | ORAL_TABLET | Freq: Three times a day (TID) | ORAL | Status: DC
  Administered 2016-08-23 – 2016-08-24 (×3): 1.5 via ORAL
  Filled 2016-08-21 (×11): qty 1.5

## 2016-08-21 MED ORDER — SUCCINYLCHOLINE CHLORIDE 200 MG/10ML IV SOSY
PREFILLED_SYRINGE | INTRAVENOUS | Status: AC
  Filled 2016-08-21: qty 10

## 2016-08-21 MED ORDER — VITAMIN D 1000 UNITS PO TABS: 1000.0000 [IU] | ORAL_TABLET | Freq: Every day | ORAL | Status: DC

## 2016-08-21 MED ORDER — HYDROCODONE-ACETAMINOPHEN 5-325 MG PO TABS
1.0000 | ORAL_TABLET | ORAL | Status: DC | PRN
  Administered 2016-08-23: 1 via ORAL
  Filled 2016-08-21: qty 1

## 2016-08-21 MED ORDER — SODIUM CHLORIDE 0.9% FLUSH
3.0000 mL | Freq: Two times a day (BID) | INTRAVENOUS | Status: DC
  Administered 2016-08-21 – 2016-08-23 (×4): 3 mL via INTRAVENOUS

## 2016-08-21 MED ORDER — ACETAMINOPHEN 500 MG PO TABS: 500.0000 mg | ORAL_TABLET | Freq: Four times a day (QID) | ORAL | Status: DC | PRN

## 2016-08-21 MED ORDER — PANTOPRAZOLE SODIUM 40 MG PO TBEC: 40.0000 mg | DELAYED_RELEASE_TABLET | Freq: Every day | ORAL | Status: DC

## 2016-08-21 MED ORDER — MORPHINE SULFATE (PF) 2 MG/ML IV SOLN
0.5000 mg | INTRAVENOUS | Status: DC | PRN
  Administered 2016-08-21: 0.5 mg via INTRAVENOUS
  Filled 2016-08-21: qty 1

## 2016-08-21 MED ORDER — SODIUM CHLORIDE 0.9 % IV SOLN
INTRAVENOUS | Status: AC
  Administered 2016-08-21: 03:00:00 via INTRAVENOUS

## 2016-08-21 MED ORDER — DONEPEZIL HCL 10 MG PO TABS
10.0000 mg | ORAL_TABLET | Freq: Every day | ORAL | Status: DC
  Administered 2016-08-23: 10 mg via ORAL
  Filled 2016-08-21 (×2): qty 1
  Filled 2016-08-21: qty 2
  Filled 2016-08-21: qty 1

## 2016-08-21 NOTE — ED Notes (Signed)
Patients granddaughter, Marybelle Killings, may also be called regarding patient if needed.  [phone]

## 2016-08-21 NOTE — Progress Notes (Signed)
SLP Cancellation Note  Patient Details Name: AQUILA DELAUGHTER MRN: [id number] DOB: [birth year]   Cancelled treatment:       Reason Eval/Treat Not Completed: Patient's level of consciousness. Not appropriate for assessment at this time, No alert. Will read the chart in am for plan/needs.    Joscelynn Brutus, Riley Nearing 08/21/2016, 2:52 PM

## 2016-08-21 NOTE — Progress Notes (Signed)
Dr. Newell Coral (Neurosurgery) in to see patient and family at bedside.  All questions answered.  Plan is to have CT scan for follow-up in the AM.  No plans for neurosurgery.

## 2016-08-21 NOTE — ED Notes (Signed)
carelink leaving with patient at this time.

## 2016-08-21 NOTE — H&P (Signed)
History and Physical    Michael [name] ZOX:[id number] DOB: Dec 21, 1927 DOA: 08/20/2016  PCP: PROVIDER NOT IN SYSTEM   Patient coming from: SNF   Chief Complaint: Fall with head laceration and right hip pain   HPI: Michael [name] is a 83 y.o. male with medical history significant for Parkinson's disease, dementia, and GERD who presents to the emergency department following a fall at his SNF resulting in right forehead laceration and right hip pain. Patient is reportedly unable to walk unassisted at his nursing facility, spends his days in a chair, and is helped into bed at night. He reportedly attempted to get out of bed this evening on his home, fell, struck his head on the foot of the bed, and landed on his right side. He was noted to have bleeding from his right forehead and cannot bear weight. EMS was called for transport to the hospital. Leading up to this, the patient had seemed to be doing quite well with no specific complaints or change in his status. History is primarily obtained through discussion with ED personnel, patient's family, report of the SNF personnel, and review of EMR.   ED Course: Upon arrival to the ED, patient is found to be afebrile, saturating well on room air, and with vital signs stable. Chemistry panel features and elevated BUN to creatinine ratio and CBC is notable for mild leukocytosis to 12,500. Chest x-ray is negative for acute cardiopulmonary disease. Radiographs of the right hip demonstrate an impacted subcapital right femoral neck fracture. Noncontrast head CT is notable for a 7 x 7 mm intraparenchymal hemorrhage in the right frontal lobe without mass effect or midline shift. Also noted on head CT is mild right frontal and temporal SAH and mild intraventricular hemorrhage in the bilateral occipital horns without acute hydrocephalus. Maxillofacial CT reveals a nondisplaced fracture of the lateral wall of the right maxillary sinus. INR is within the normal limits. Type and  screen was performed. Patient was treated with 500 mL normal saline, his Tdap was updated, the right forehead laceration was closed with sutures in the ED, and consultations were requested from neurosurgery and orthopedic surgery. The neurosurgeon advised admitting the patient to [facility] for observation and repeat scan. Orthopedic surgeon advised for medical admission and will plan to see the patient in the morning for possible surgical repair of the hip.trauma surgery was consulted and advised that the patient is more appropriate for medical admission. Patient remained hemodynamically stable in the ED and has not been in any respiratory distress. He will be admitted to the stepdown unit [facility] for ongoing evaluation and management of intracranial bleeding and right hip fracture following a fall at his nursing facility.  Review of Systems:  All other systems reviewed and apart from HPI, are negative.  Past Medical History:  Diagnosis Date  . Hallucinations   . 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 Other vitamin B12 deficiency anemia   . Paralysis agitans (HCC)   . Parkinson's disease (HCC) 03/27/2013  . Unspecified transient cerebral ischemia     Past Surgical History:  Procedure Laterality Date  . APPENDECTOMY    . CHOLECYSTECTOMY    . PTCA  1987  . TRANSURETHRAL RESECTION OF PROSTATE       reports that he has never smoked. He has never used smokeless tobacco. He reports that he does not drink alcohol or use drugs.  No Known Allergies  Family History  Problem Relation  Age of Onset  . Parkinson's disease Sister   . Cancer Brother   . Parkinson's disease Daughter 15     Prior to Admission medications   Medication Sig Start Date End Date Taking? Authorizing Provider  acetaminophen (TYLENOL) 500 MG tablet Take 500 mg by mouth every 6 (six) hours as needed for moderate pain.    Yes Historical Provider, MD  AMBULATORY  NON FORMULARY MEDICATION Magic Cup Sig: One cup by mouth every day with lunch meal for weight loss   Yes Historical Provider, MD  aspirin 325 MG tablet Take 325 mg by mouth daily.    Yes Historical Provider, MD  carbidopa-levodopa (SINEMET IR) 25-100 MG tablet 1.5 tablets by mouth three times daily. Dosing times : 1 hour before meals or 2 hours after meals.    Yes Historical Provider, MD  Cholecalciferol (VITAMIN D-3) 1000 UNITS CAPS Take 1 capsule by mouth daily.    Yes Historical Provider, MD  donepezil (ARICEPT) 10 MG tablet Take 1 tablet by mouth at  bedtime 07/15/15  Yes Huston Foley, MD  fish oil-omega-3 fatty acids 1000 MG capsule Take 1,000 mg by mouth daily.    Yes Historical Provider, MD  ketoconazole (NIZORAL) 2 % shampoo Apply 1 application topically 2 (two) times a week. Wed/saturday 3-11 shift until resolved   Yes Historical Provider, MD  MALTODEXTRIN-XANTHAN GUM PO Give 1gm by mouth as needed due to high risk for aspiration   Yes Historical Provider, MD  Multiple Vitamin (MULTIVITAMIN WITH MINERALS) TABS tablet Take 1 tablet by mouth daily.   Yes Historical Provider, MD  multivitamin-lutein (OCUVITE-LUTEIN) CAPS capsule Take 1 capsule by mouth daily.   Yes Historical Provider, MD  omeprazole (PRILOSEC) 20 MG capsule Take 20 mg by mouth daily.    Yes Historical Provider, MD    Physical Exam: Vitals:   08/20/16 1836 08/20/16 2008 08/20/16 2109 08/21/16 0012  BP:   (!) 141/71 (!) 93/57  Pulse:   77 77  Resp:   18 12  Temp:  97.7 F (36.5 C)    TempSrc:  Rectal    SpO2: 95%  97% 91%      Constitutional: NAD, calm, sleeping, easily woken.  Eyes: PERTLA, lids and conjunctivae normal ENMT: Mucous membranes are moist. Posterior pharynx clear of any exudate or lesions.   Neck: normal, supple, no masses, no thyromegaly Respiratory: clear to auscultation bilaterally, no wheezing, no crackles. Normal respiratory effort.  Cardiovascular: S1 & S2 heard, regular rate and rhythm. No  extremity edema. No significant JVD. Abdomen: No distension, no tenderness, no masses palpated. Bowel sounds normal.  Musculoskeletal: no clubbing / cyanosis. RLE is shortened and externally-rotated.  Skin: no significant rashes, lesions, ulcers. Warm, dry, well-perfused. Laceration to right temple is approximated with sutures with surrounding ecchymosis.  Neurologic: no gross facial asymmetry, PERRL. Sensation intact throughout. Grip strength 5/5 and symmetric.  Psychiatric: Cooperative and pleasant.      Labs on Admission: I have personally reviewed following labs and imaging studies  CBC:  Recent Labs Lab 08/20/16 2000  WBC 12.5*  NEUTROABS 10.6*  HGB 13.4  HCT 41.5  MCV 90.8  PLT 295   Basic Metabolic Panel:  Recent Labs Lab 08/20/16 2000  NA 144  K 3.9  CL 109  CO2 28  GLUCOSE 117*  BUN 24*  CREATININE 0.86  CALCIUM 10.2   GFR: CrCl cannot be calculated (Unknown ideal weight.). Liver Function Tests: No results for input(s): AST, ALT, ALKPHOS, BILITOT, PROT, ALBUMIN in  the last 168 hours. No results for input(s): LIPASE, AMYLASE in the last 168 hours. No results for input(s): AMMONIA in the last 168 hours. Coagulation Profile:  Recent Labs Lab 08/20/16 2000  INR 0.93   Cardiac Enzymes: No results for input(s): CKTOTAL, CKMB, CKMBINDEX, TROPONINI in the last 168 hours. BNP (last 3 results) No results for input(s): PROBNP in the last 8760 hours. HbA1C: No results for input(s): HGBA1C in the last 72 hours. CBG: No results for input(s): GLUCAP in the last 168 hours. Lipid Profile: No results for input(s): CHOL, HDL, LDLCALC, TRIG, CHOLHDL, LDLDIRECT in the last 72 hours. Thyroid Function Tests: No results for input(s): TSH, T4TOTAL, FREET4, T3FREE, THYROIDAB in the last 72 hours. Anemia Panel: No results for input(s): VITAMINB12, FOLATE, FERRITIN, TIBC, IRON, RETICCTPCT in the last 72 hours. Urine analysis:    Component Value Date/Time   COLORURINE  ORANGE (A) 08/31/2015 2151   APPEARANCEUR TURBID (A) 08/31/2015 2151   LABSPEC 1.027 08/31/2015 2151   PHURINE 5.0 08/31/2015 2151   GLUCOSEU NEGATIVE 08/31/2015 2151   HGBUR LARGE (A) 08/31/2015 2151   BILIRUBINUR SMALL (A) 08/31/2015 2151   KETONESUR NEGATIVE 08/31/2015 2151   PROTEINUR 100 (A) 08/31/2015 2151   UROBILINOGEN 1.0 07/08/2009 0320   NITRITE POSITIVE (A) 08/31/2015 2151   LEUKOCYTESUR LARGE (A) 08/31/2015 2151   Sepsis Labs: (procalcitonin:4,lacticidven:4) )No results found for this or any previous visit (from the past 240 hour(s)).   Radiological Exams on Admission: Ct Head Wo Contrast  Result Date: 08/20/2016 CLINICAL DATA:  Fall. Head, facial, and neck pain and swelling. Initial encounter. EXAM: CT HEAD WITHOUT CONTRAST CT MAXILLOFACIAL WITHOUT CONTRAST CT CERVICAL SPINE WITHOUT CONTRAST TECHNIQUE: Multidetector CT imaging of the head, cervical spine, and maxillofacial structures were performed using the standard protocol without intravenous contrast. Multiplanar CT image reconstructions of the cervical spine and maxillofacial structures were also generated. COMPARISON:  CT on 02/14/2016 and cervical spine CT on 08/29/2015 FINDINGS: CT HEAD FINDINGS Brain: 7 x 7 mm intraparenchymal hemorrhage seen in the right frontal lobe. Mild subarachnoid hemorrhage is seen in the right frontal and temporal regions, and mild intraventricular hemorrhage is seen in the occipital horns bilaterally. Ventricles remain stable in size. No evidence of acute cerebral infarct, edema, mass effect, or midline shift. Mild to moderate diffuse cerebral atrophy again noted. Vascular: No hyperdense vessel or unexpected calcification. Skull: Normal. Negative for fracture or focal lesion. Other: None. CT MAXILLOFACIAL FINDINGS Osseous: Nondisplaced fracture is seen involving the lateral wall of the right maxillary sinus. No other are facial bone fractures are identified. Orbits: No evidence of  fracture. Globes and other intraorbital anatomy are unremarkable. Sinuses: Right maxillary sinus air-fluid level. Soft tissues: Right frontal, maxillary, and preseptal soft tissue swelling is seen. CT CERVICAL SPINE FINDINGS Alignment: Normal. Skull base and vertebrae: No acute fracture. No primary bone lesion or focal pathologic process. Soft tissues and spinal canal: No prevertebral fluid or swelling. No visible canal hematoma. 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 Mild to moderate degenerative disc disease seen at C5-6. Mild to moderate facet DJD present bilaterally at C4-5 and C5-6. Moderate atlantoaxial degenerative changes also seen. Upper chest: Negative. Other: None. IMPRESSION: 7 x 7 mm intraparenchymal hemorrhage in right frontal lobe. No evidence of mass effect or midline shift. Mild right frontal and temporal subarachnoid hemorrhage. Mild intraventricular hemorrhage in both occipital horns. No evidence of acute hydrocephalus. Nondisplaced fracture of the lateral wall of the right maxillary sinus. No evidence of  acute cervical spine fracture or subluxation. Degenerative spondylosis and congenital fusion, as described above. Critical Value/emergent results were called by telephone at the time of interpretation on 08/20/2016 at 8:41 pm to Dr. Trixie Dredge , who verbally acknowledged these results. Electronically Signed   By: Myles Rosenthal M.D.   On: 08/20/2016 20:43   Ct Cervical Spine Wo Contrast  Result Date: 08/20/2016 CLINICAL DATA:  Fall. Head, facial, and neck pain and swelling. Initial encounter. EXAM: CT HEAD WITHOUT CONTRAST CT MAXILLOFACIAL WITHOUT CONTRAST CT CERVICAL SPINE WITHOUT CONTRAST TECHNIQUE: Multidetector CT imaging of the head, cervical spine, and maxillofacial structures were performed using the standard protocol without intravenous contrast. Multiplanar CT image reconstructions of the cervical spine and maxillofacial structures were also generated. COMPARISON:  CT  on 02/14/2016 and cervical spine CT on 08/29/2015 FINDINGS: CT HEAD FINDINGS Brain: 7 x 7 mm intraparenchymal hemorrhage seen in the right frontal lobe. Mild subarachnoid hemorrhage is seen in the right frontal and temporal regions, and mild intraventricular hemorrhage is seen in the occipital horns bilaterally. Ventricles remain stable in size. No evidence of acute cerebral infarct, edema, mass effect, or midline shift. Mild to moderate diffuse cerebral atrophy again noted. Vascular: No hyperdense vessel or unexpected calcification. Skull: Normal. Negative for fracture or focal lesion. Other: None. CT MAXILLOFACIAL FINDINGS Osseous: Nondisplaced fracture is seen involving the lateral wall of the right maxillary sinus. No other are facial bone fractures are identified. Orbits: No evidence of fracture. Globes and other intraorbital anatomy are unremarkable. Sinuses: Right maxillary sinus air-fluid level. Soft tissues: Right frontal, maxillary, and preseptal soft tissue swelling is seen. CT CERVICAL SPINE FINDINGS Alignment: Normal. Skull base and vertebrae: No acute fracture. No primary bone lesion or focal pathologic process. Soft tissues and spinal canal: No prevertebral fluid or swelling. No visible canal hematoma. 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 Mild to moderate degenerative disc disease seen at C5-6. Mild to moderate facet DJD present bilaterally at C4-5 and C5-6. Moderate atlantoaxial degenerative changes also seen. Upper chest: Negative. Other: None. IMPRESSION: 7 x 7 mm intraparenchymal hemorrhage in right frontal lobe. No evidence of mass effect or midline shift. Mild right frontal and temporal subarachnoid hemorrhage. Mild intraventricular hemorrhage in both occipital horns. No evidence of acute hydrocephalus. Nondisplaced fracture of the lateral wall of the right maxillary sinus. No evidence of acute cervical spine fracture or subluxation. Degenerative spondylosis and  congenital fusion, as described above. Critical Value/emergent results were called by telephone at the time of interpretation on 08/20/2016 at 8:41 pm to Dr. Trixie Dredge , who verbally acknowledged these results. Electronically Signed   By: Myles Rosenthal M.D.   On: 08/20/2016 20:43   Dg Chest Port 1 View  Result Date: 08/20/2016 CLINICAL DATA:  Fall.  Right hip fracture.  Pre-op respiratory exam EXAM: PORTABLE CHEST 1 VIEW COMPARISON:  08/31/2015 FINDINGS: Heart size remains normal. Aortic atherosclerosis. Low lung volumes again noted. Scarring again seen in left lung base. No evidence of pulmonary infiltrate or edema. No evidence of pneumothorax or pleural effusion. Several old right rib fracture deformities again noted. IMPRESSION: Stable left basilar scarring.  No active lung disease. Aortic atherosclerosis. Electronically Signed   By: Myles Rosenthal M.D.   On: 08/20/2016 20:12   Dg Hip Unilat W Or Wo Pelvis 2-3 Views Right  Result Date: 08/20/2016 CLINICAL DATA:  Fall.  Right hip pain.  Initial encounter. EXAM: DG HIP (WITH OR WITHOUT PELVIS) 2-3V RIGHT COMPARISON:  None. FINDINGS: Impacted  subcapital right femoral neck fracture is seen. No evidence of dislocation. Generalized osteopenia. IMPRESSION: Impacted subcapital right femoral neck fracture. Electronically Signed   By: Myles Rosenthal M.D.   On: 08/20/2016 20:10   Ct Maxillofacial Wo Cm  Result Date: 08/20/2016 CLINICAL DATA:  Fall. Head, facial, and neck pain and swelling. Initial encounter. EXAM: CT HEAD WITHOUT CONTRAST CT MAXILLOFACIAL WITHOUT CONTRAST CT CERVICAL SPINE WITHOUT CONTRAST TECHNIQUE: Multidetector CT imaging of the head, cervical spine, and maxillofacial structures were performed using the standard protocol without intravenous contrast. Multiplanar CT image reconstructions of the cervical spine and maxillofacial structures were also generated. COMPARISON:  CT on 02/14/2016 and cervical spine CT on 08/29/2015 FINDINGS: CT HEAD FINDINGS  Brain: 7 x 7 mm intraparenchymal hemorrhage seen in the right frontal lobe. Mild subarachnoid hemorrhage is seen in the right frontal and temporal regions, and mild intraventricular hemorrhage is seen in the occipital horns bilaterally. Ventricles remain stable in size. No evidence of acute cerebral infarct, edema, mass effect, or midline shift. Mild to moderate diffuse cerebral atrophy again noted. Vascular: No hyperdense vessel or unexpected calcification. Skull: Normal. Negative for fracture or focal lesion. Other: None. CT MAXILLOFACIAL FINDINGS Osseous: Nondisplaced fracture is seen involving the lateral wall of the right maxillary sinus. No other are facial bone fractures are identified. Orbits: No evidence of fracture. Globes and other intraorbital anatomy are unremarkable. Sinuses: Right maxillary sinus air-fluid level. Soft tissues: Right frontal, maxillary, and preseptal soft tissue swelling is seen. CT CERVICAL SPINE FINDINGS Alignment: Normal. Skull base and vertebrae: No acute fracture. No primary bone lesion or focal pathologic process. Soft tissues and spinal canal: No prevertebral fluid or swelling. No visible canal hematoma. 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 Mild to moderate degenerative disc disease seen at C5-6. Mild to moderate facet DJD present bilaterally at C4-5 and C5-6. Moderate atlantoaxial degenerative changes also seen. Upper chest: Negative. Other: None. IMPRESSION: 7 x 7 mm intraparenchymal hemorrhage in right frontal lobe. No evidence of mass effect or midline shift. Mild right frontal and temporal subarachnoid hemorrhage. Mild intraventricular hemorrhage in both occipital horns. No evidence of acute hydrocephalus. Nondisplaced fracture of the lateral wall of the right maxillary sinus. No evidence of acute cervical spine fracture or subluxation. Degenerative spondylosis and congenital fusion, as described above. Critical Value/emergent results were called by  telephone at the time of interpretation on 08/20/2016 at 8:41 pm to Dr. Trixie Dredge , who verbally acknowledged these results. Electronically Signed   By: Myles Rosenthal M.D.   On: 08/20/2016 20:43    EKG: Ordered and pending.   Assessment/Plan  1. Intraparenchymal hemorrhage, SAH, intraventricular hemorrhage   - Pt presents following a fall at his SNF; head reportedly struck foot of bed as he fell  - CT reveals intraparenchymal hemorrhage in right frontal lobe, mild right frontal and temporal SAH, and mild intraventricular hemorrhage in b/l occipital horns without acute hydrocephalus  - Neurosurgery is consulting and much appreciated, will follow-up on recommendations  - VTE ppx with SCD's, hydralazine IVP's prn, frequent neuro checks, type & screen to be repeated once at [facility]   2. Right hip fracture  - Pt suffered an impacted right subcapital femoral neck fracture during fall at SNF just PTA  - Pt is non-ambulatory at baseline  - Orthopedic surgery is consulting and much appreciated, will follow-up on recommendations  - Based on the available data, Michael [name] presents a relatively high estimated risk probability of 2.5% for perioperative MI  or cardiac arrest per Nolon Nations al - ASA is held, gentle IVF hydration, NPO, pain-control with prn's    3. Parkinson's disease, dementia   - Progressing as expected per family  - Continue Sinemet and Aricept    DVT prophylaxis: SCD's  Code Status: DNR Family Communication: Son updated at bedside Disposition Plan: Admit to SDU  Consults called: Neurosurgery, orthopedic surgery Admission status: Inpatient    Briscoe Deutscher, MD [facility] Pager [phone]  If 7PM-7AM, please contact night-coverage [email or web address] Password [email or web address]  08/21/2016, 1:02 AM

## 2016-08-21 NOTE — Progress Notes (Signed)
Dr. Renaye Rakers (Ortho) at bedside to discuss surgical options for right hip fx with family.  Family does NOT want surgery for the right hip. Dr. Eulah Pont explained the patient's injury, expectations for patient in recovery period including shortening of right leg and decreased ability to walk in a normal gait pattern.  Family questions were addressed.  Again, the family, including HPOA, Onalee Hua, choose NO SURGERY for patient at this time.

## 2016-08-21 NOTE — ED Notes (Signed)
Patient is unable to sign due to AMS. Writer attempted to call patients son for formal verbal consent, but no answer. Family left prior to signing emtala but son and grandaughter were aware and agreeable of transfer to [facility] prior to leaving. Charge nurse is also aware.

## 2016-08-21 NOTE — ED Notes (Signed)
[facility] called for transport. 

## 2016-08-21 NOTE — Consult Note (Signed)
Reason for Consult:  Multiple trauma including head injury Referring Physician:  Dr. Christia Reading Opyd  Michael [name] is an 83 y.o. male.  HPI: Elderly patient, resident of [facility], a skilled nursing facility, with significant history for Parkinson's disease and dementia. Patient apparently fell while trying to get up, suffering a multiple trauma. He was taken to [facility] emergency room where he was evaluated by Dr. Orlie Dakin (EDP) who found a subcapital right hip fracture, minimal traumatic subarachnoid hemorrhage and a small traumatic parenchymal intracerebral hemorrhage, and right maxillary sinus fracture. Patient apparently was a DO NOT RESUSCITATE at the SNF, and that status has been continued here at [facility]. The patient's family has indicated that they do not want surgical intervention on the right hip or the head. The patient was admitted to the triad hospitalist service at [facility], and is scheduled for follow-up CT the brain without contrast tomorrow morning. Patient is followed by Dr. Star Age, from [facility], regarding his Parkinson's disease. Patient's family describes prior to his injury yesterday that he's had very limited mobility, at times he is more responsive and at other times he is less responsive, and that he'll feed himself maybe once a day but otherwise he'll need the staff to feed him another once or twice a day. They described that although he seems to recognize familiar people, he has not been oriented to place or year.  Past Medical History:  Past Medical History:  Diagnosis Date  . Hallucinations   . 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 Other vitamin B12 deficiency anemia   . Paralysis agitans ([place])   . Parkinson's disease ([place]) 03/27/2013  . Unspecified transient cerebral ischemia     Past Surgical History:  Past Surgical History:  Procedure Laterality  Date  . APPENDECTOMY    . CHOLECYSTECTOMY    . PTCA  1987  . TRANSURETHRAL RESECTION OF PROSTATE      Family History:  Family History  Problem Relation Age of Onset  . Parkinson's disease Sister   . Cancer Brother   . Parkinson's disease Daughter 71    Social History:  reports that he has never smoked. He has never used smokeless tobacco. He reports that he does not drink alcohol or use drugs.  Allergies: No Known Allergies  Medications: I have reviewed the patient's current medications.  ROS  Physical Examination: Patient sitting elderly white male in no acute distress. Blood pressure (!) 147/74, pulse 69, temperature 97.7 F (36.5 C), temperature source Oral, resp. rate 14, height '5\' 2"'$  (1.575 m), weight 64.4 kg (142 lb), SpO2 (!) 83 %. External examination:  Sutured laceration in the right upper lateral face. Ecchymosis in the right upper lateral face, lateral to the right brow, orbit, and maxilla. No battle sign or raccoon sign.  Neurological Examination: Mental Status Examination:  Opening eyes weakly, occasionally spontaneously or to voice stimulation. Occasional unintelligible speech. Not answering questions of orientation. Not following commands. Cranial Nerve Examination:  Pupils 2.5 mm bilaterally, round, reactive to light. Face grossly symmetrical. Motor Examination:  Varying amounts of resting tremor. Purposeful movement of extremities. Sensory Examination:  Senses touch with all 4 extremities. Reflex Examination:   Diminished throughout. Gait and Stance Examination:  Not testable due to the nature of his condition.   Results for orders placed or performed during the hospital encounter of 08/20/16 (from the past 48 hour(s))  Basic metabolic panel  Status: Abnormal   Collection Time: 08/20/16  8:00 PM  Result Value Ref Range   Sodium 144 135 - 145 mmol/L   Potassium 3.9 3.5 - 5.1 mmol/L   Chloride 109 101 - 111 mmol/L   CO2 28 22 - 32 mmol/L   Glucose, Bld  117 (H) 65 - 99 mg/dL   BUN 24 (H) 6 - 20 mg/dL   Creatinine, Ser 0.86 0.61 - 1.24 mg/dL   Calcium 10.2 8.9 - 10.3 mg/dL   GFR calc non Af Amer >60 >60 mL/min   GFR calc Af Amer >60 >60 mL/min    Comment: (NOTE) The eGFR has been calculated using the CKD EPI equation. This calculation has not been validated in all clinical situations. eGFR's persistently <60 mL/min signify possible Chronic Kidney Disease.    Anion gap 7 5 - 15  CBC WITH DIFFERENTIAL     Status: Abnormal   Collection Time: 08/20/16  8:00 PM  Result Value Ref Range   WBC 12.5 (H) 4.0 - 10.5 K/uL   RBC 4.57 4.22 - 5.81 MIL/uL   Hemoglobin 13.4 13.0 - 17.0 g/dL   HCT 41.5 39.0 - 52.0 %   MCV 90.8 78.0 - 100.0 fL   MCH 29.3 26.0 - 34.0 pg   MCHC 32.3 30.0 - 36.0 g/dL   RDW 14.0 11.5 - 15.5 %   Platelets 295 150 - 400 K/uL   Neutrophils Relative % 85 %   Neutro Abs 10.6 (H) 1.7 - 7.7 K/uL   Lymphocytes Relative 9 %   Lymphs Abs 1.2 0.7 - 4.0 K/uL   Monocytes Relative 3 %   Monocytes Absolute 0.4 0.1 - 1.0 K/uL   Eosinophils Relative 3 %   Eosinophils Absolute 0.3 0.0 - 0.7 K/uL   Basophils Relative 0 %   Basophils Absolute 0.1 0.0 - 0.1 K/uL  Protime-INR     Status: None   Collection Time: 08/20/16  8:00 PM  Result Value Ref Range   Prothrombin Time 12.5 11.4 - 15.2 seconds   INR 0.93   Type and screen [place]     Status: None   Collection Time: 08/20/16  8:00 PM  Result Value Ref Range   ABO/RH(D) A NEG    Antibody Screen NEG    Sample Expiration 08/23/2016   ABO/Rh     Status: None   Collection Time: 08/20/16  8:00 PM  Result Value Ref Range   ABO/RH(D) A NEG   CBC     Status: Abnormal   Collection Time: 08/21/16  3:20 AM  Result Value Ref Range   WBC 16.6 (H) 4.0 - 10.5 K/uL   RBC 4.26 4.22 - 5.81 MIL/uL   Hemoglobin 12.6 (L) 13.0 - 17.0 g/dL   HCT 39.6 39.0 - 52.0 %   MCV 93.0 78.0 - 100.0 fL   MCH 29.6 26.0 - 34.0 pg   MCHC 31.8 30.0 - 36.0 g/dL   RDW 14.5 11.5 - 15.5  %   Platelets 233 150 - 400 K/uL    Comment: Performed at [facility], [place] [address], [place], [place] [address]  Basic metabolic panel     Status: Abnormal   Collection Time: 08/21/16  3:20 AM  Result Value Ref Range   Sodium 141 135 - 145 mmol/L   Potassium 4.4 3.5 - 5.1 mmol/L   Chloride 109 101 - 111 mmol/L   CO2 21 (L) 22 - 32 mmol/L   Glucose, Bld 126 (  H) 65 - 99 mg/dL   BUN 23 (H) 6 - 20 mg/dL   Creatinine, Ser 0.99 0.61 - 1.24 mg/dL   Calcium 9.6 8.9 - 10.3 mg/dL   GFR calc non Af Amer >60 >60 mL/min   GFR calc Af Amer >60 >60 mL/min    Comment: (NOTE) The eGFR has been calculated using the CKD EPI equation. This calculation has not been validated in all clinical situations. eGFR's persistently <60 mL/min signify possible Chronic Kidney Disease.    Anion gap 11 5 - 15    Comment: Performed at [facility] [address], [place], [place] [address]  Urinalysis, Routine w reflex microscopic     Status: Abnormal   Collection Time: 08/21/16  5:16 AM  Result Value Ref Range   Color, Urine YELLOW YELLOW   APPearance HAZY (A) CLEAR   Specific Gravity, Urine 1.021 1.005 - 1.030   pH 5.0 5.0 - 8.0   Glucose, UA NEGATIVE NEGATIVE mg/dL   Hgb urine dipstick SMALL (A) NEGATIVE   Bilirubin Urine NEGATIVE NEGATIVE   Ketones, ur NEGATIVE NEGATIVE mg/dL   Protein, ur NEGATIVE NEGATIVE mg/dL   Nitrite NEGATIVE NEGATIVE   Leukocytes, UA LARGE (A) NEGATIVE   RBC / HPF 6-30 0 - 5 RBC/hpf   WBC, UA TOO NUMEROUS TO COUNT 0 - 5 WBC/hpf   Bacteria, UA RARE (A) NONE SEEN   Squamous Epithelial / LPF 0-5 (A) NONE SEEN   Mucous PRESENT   Troponin I (q 6hr x 3)     Status: None   Collection Time: 08/21/16 10:38 AM  Result Value Ref Range   Troponin I <0.03 <0.03 ng/mL  Type and screen [place]     Status: None   Collection Time: 08/21/16 10:48 AM  Result Value Ref Range   ABO/RH(D) A NEG    Antibody Screen NEG    Sample Expiration 08/24/2016   ABO/Rh      Status: None   Collection Time: 08/21/16 10:48 AM  Result Value Ref Range   ABO/RH(D) A NEG     Ct Head Wo Contrast  Result Date: 08/20/2016 CLINICAL DATA:  Fall. Head, facial, and neck pain and swelling. Initial encounter. EXAM: CT HEAD WITHOUT CONTRAST CT MAXILLOFACIAL WITHOUT CONTRAST CT CERVICAL SPINE WITHOUT CONTRAST TECHNIQUE: Multidetector CT imaging of the head, cervical spine, and maxillofacial structures were performed using the standard protocol without intravenous contrast. Multiplanar CT image reconstructions of the cervical spine and maxillofacial structures were also generated. COMPARISON:  CT on 02/14/2016 and cervical spine CT on 08/29/2015 FINDINGS: CT HEAD FINDINGS Brain: 7 x 7 mm intraparenchymal hemorrhage seen in the right frontal lobe. Mild subarachnoid hemorrhage is seen in the right frontal and temporal regions, and mild intraventricular hemorrhage is seen in the occipital horns bilaterally. Ventricles remain stable in size. No evidence of acute cerebral infarct, edema, mass effect, or midline shift. Mild to moderate diffuse cerebral atrophy again noted. Vascular: No hyperdense vessel or unexpected calcification. Skull: Normal. Negative for fracture or focal lesion. Other: None. CT MAXILLOFACIAL FINDINGS Osseous: Nondisplaced fracture is seen involving the lateral wall of the right maxillary sinus. No other are facial bone fractures are identified. Orbits: No evidence of fracture. Globes and other intraorbital anatomy are unremarkable. Sinuses: Right maxillary sinus air-fluid level. Soft tissues: Right frontal, maxillary, and preseptal soft tissue swelling is seen. CT CERVICAL SPINE FINDINGS Alignment: Normal. Skull base and vertebrae: No acute fracture. No primary bone lesion or focal pathologic process.  Soft tissues and spinal canal: No prevertebral fluid or swelling. No visible canal hematoma. 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 Mild to moderate  degenerative disc disease seen at C5-6. Mild to moderate facet DJD present bilaterally at C4-5 and C5-6. Moderate atlantoaxial degenerative changes also seen. Upper chest: Negative. Other: None. IMPRESSION: 7 x 7 mm intraparenchymal hemorrhage in right frontal lobe. No evidence of mass effect or midline shift. Mild right frontal and temporal subarachnoid hemorrhage. Mild intraventricular hemorrhage in both occipital horns. No evidence of acute hydrocephalus. Nondisplaced fracture of the lateral wall of the right maxillary sinus. No evidence of acute cervical spine fracture or subluxation. Degenerative spondylosis and congenital fusion, as described above. Critical Value/emergent results were called by telephone at the time of interpretation on 08/20/2016 at 8:41 pm to Dr. Clayton Bibles , who verbally acknowledged these results. Electronically Signed   By: Earle Gell M.D.   On: 08/20/2016 20:43   Ct Cervical Spine Wo Contrast  Result Date: 08/20/2016 CLINICAL DATA:  Fall. Head, facial, and neck pain and swelling. Initial encounter. EXAM: CT HEAD WITHOUT CONTRAST CT MAXILLOFACIAL WITHOUT CONTRAST CT CERVICAL SPINE WITHOUT CONTRAST TECHNIQUE: Multidetector CT imaging of the head, cervical spine, and maxillofacial structures were performed using the standard protocol without intravenous contrast. Multiplanar CT image reconstructions of the cervical spine and maxillofacial structures were also generated. COMPARISON:  CT on 02/14/2016 and cervical spine CT on 08/29/2015 FINDINGS: CT HEAD FINDINGS Brain: 7 x 7 mm intraparenchymal hemorrhage seen in the right frontal lobe. Mild subarachnoid hemorrhage is seen in the right frontal and temporal regions, and mild intraventricular hemorrhage is seen in the occipital horns bilaterally. Ventricles remain stable in size. No evidence of acute cerebral infarct, edema, mass effect, or midline shift. Mild to moderate diffuse cerebral atrophy again noted. Vascular: No hyperdense vessel  or unexpected calcification. Skull: Normal. Negative for fracture or focal lesion. Other: None. CT MAXILLOFACIAL FINDINGS Osseous: Nondisplaced fracture is seen involving the lateral wall of the right maxillary sinus. No other are facial bone fractures are identified. Orbits: No evidence of fracture. Globes and other intraorbital anatomy are unremarkable. Sinuses: Right maxillary sinus air-fluid level. Soft tissues: Right frontal, maxillary, and preseptal soft tissue swelling is seen. CT CERVICAL SPINE FINDINGS Alignment: Normal. Skull base and vertebrae: No acute fracture. No primary bone lesion or focal pathologic process. Soft tissues and spinal canal: No prevertebral fluid or swelling. No visible canal hematoma. 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 Mild to moderate degenerative disc disease seen at C5-6. Mild to moderate facet DJD present bilaterally at C4-5 and C5-6. Moderate atlantoaxial degenerative changes also seen. Upper chest: Negative. Other: None. IMPRESSION: 7 x 7 mm intraparenchymal hemorrhage in right frontal lobe. No evidence of mass effect or midline shift. Mild right frontal and temporal subarachnoid hemorrhage. Mild intraventricular hemorrhage in both occipital horns. No evidence of acute hydrocephalus. Nondisplaced fracture of the lateral wall of the right maxillary sinus. No evidence of acute cervical spine fracture or subluxation. Degenerative spondylosis and congenital fusion, as described above. Critical Value/emergent results were called by telephone at the time of interpretation on 08/20/2016 at 8:41 pm to Dr. Clayton Bibles , who verbally acknowledged these results. Electronically Signed   By: Earle Gell M.D.   On: 08/20/2016 20:43   Dg Chest Port 1 View  Result Date: 08/20/2016 CLINICAL DATA:  Fall.  Right hip fracture.  Pre-op respiratory exam EXAM: PORTABLE CHEST 1 VIEW COMPARISON:  08/31/2015 FINDINGS:  Heart size remains normal. Aortic atherosclerosis. Low lung  volumes again noted. Scarring again seen in left lung base. No evidence of pulmonary infiltrate or edema. No evidence of pneumothorax or pleural effusion. Several old right rib fracture deformities again noted. IMPRESSION: Stable left basilar scarring.  No active lung disease. Aortic atherosclerosis. Electronically Signed   By: Earle Gell M.D.   On: 08/20/2016 20:12   Dg Hip Unilat W Or Wo Pelvis 2-3 Views Right  Result Date: 08/20/2016 CLINICAL DATA:  Fall.  Right hip pain.  Initial encounter. EXAM: DG HIP (WITH OR WITHOUT PELVIS) 2-3V RIGHT COMPARISON:  None. FINDINGS: Impacted subcapital right femoral neck fracture is seen. No evidence of dislocation. Generalized osteopenia. IMPRESSION: Impacted subcapital right femoral neck fracture. Electronically Signed   By: Earle Gell M.D.   On: 08/20/2016 20:10   Ct Maxillofacial Wo Cm  Result Date: 08/20/2016 CLINICAL DATA:  Fall. Head, facial, and neck pain and swelling. Initial encounter. EXAM: CT HEAD WITHOUT CONTRAST CT MAXILLOFACIAL WITHOUT CONTRAST CT CERVICAL SPINE WITHOUT CONTRAST TECHNIQUE: Multidetector CT imaging of the head, cervical spine, and maxillofacial structures were performed using the standard protocol without intravenous contrast. Multiplanar CT image reconstructions of the cervical spine and maxillofacial structures were also generated. COMPARISON:  CT on 02/14/2016 and cervical spine CT on 08/29/2015 FINDINGS: CT HEAD FINDINGS Brain: 7 x 7 mm intraparenchymal hemorrhage seen in the right frontal lobe. Mild subarachnoid hemorrhage is seen in the right frontal and temporal regions, and mild intraventricular hemorrhage is seen in the occipital horns bilaterally. Ventricles remain stable in size. No evidence of acute cerebral infarct, edema, mass effect, or midline shift. Mild to moderate diffuse cerebral atrophy again noted. Vascular: No hyperdense vessel or unexpected calcification. Skull: Normal. Negative for fracture or focal lesion. Other:  None. CT MAXILLOFACIAL FINDINGS Osseous: Nondisplaced fracture is seen involving the lateral wall of the right maxillary sinus. No other are facial bone fractures are identified. Orbits: No evidence of fracture. Globes and other intraorbital anatomy are unremarkable. Sinuses: Right maxillary sinus air-fluid level. Soft tissues: Right frontal, maxillary, and preseptal soft tissue swelling is seen. CT CERVICAL SPINE FINDINGS Alignment: Normal. Skull base and vertebrae: No acute fracture. No primary bone lesion or focal pathologic process. Soft tissues and spinal canal: No prevertebral fluid or swelling. No visible canal hematoma. 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 Mild to moderate degenerative disc disease seen at C5-6. Mild to moderate facet DJD present bilaterally at C4-5 and C5-6. Moderate atlantoaxial degenerative changes also seen. Upper chest: Negative. Other: None. IMPRESSION: 7 x 7 mm intraparenchymal hemorrhage in right frontal lobe. No evidence of mass effect or midline shift. Mild right frontal and temporal subarachnoid hemorrhage. Mild intraventricular hemorrhage in both occipital horns. No evidence of acute hydrocephalus. Nondisplaced fracture of the lateral wall of the right maxillary sinus. No evidence of acute cervical spine fracture or subluxation. Degenerative spondylosis and congenital fusion, as described above. Critical Value/emergent results were called by telephone at the time of interpretation on 08/20/2016 at 8:41 pm to Dr. Clayton Bibles , who verbally acknowledged these results. Electronically Signed   By: Earle Gell M.D.   On: 08/20/2016 20:43     Assessment/Plan: Patient with advanced Parkinson's disease and underlying dementia who fell at a SNF and suffered a multiple trauma. CT the brain shows a small amount of traumatic subarachnoid hemorrhage and a small (7 mm in diameter) intraparenchymal intracerebral hemorrhage; there is no mass effect from these  hemorrhages; there  is significant generalized cerebral atrophy.  The patient's family has indicated that do not want the patient to undergo any type of surgery. However they do want him to undergo the follow-up CT the brain without contrast tomorrow morning.  I spoke with the patient's family including his wife and son, as well as to other family members for about 20 minutes at the bedside. Most likely the intracranial hemorrhage will be stable or improved, and less likely it is going to worsen significantly, and even if it did they are not inclined to have the patient undergo surgery.  Certainly this is very appropriate in consideration of his premorbid status. Their questions regarding his head injury were answered for them.  Hosie Spangle, MD 08/21/2016, 7:34 PM

## 2016-08-21 NOTE — Progress Notes (Signed)
PROGRESS NOTE    Michael [name]  ZOX:[id number] DOB: May 30, 1927 DOA: 08/20/2016 PCP: PROVIDER NOT IN SYSTEM   Brief Narrative: Michael [name] is a 83 y.o. male with a medical history of Parkinson disease, dementia and GERD. He presented after having a traumatic fall at his SNF. He does not recall the event. He was found to have an intraparenchymal/temporal subarachnoid hemorrhage, nondisplaced maxillary fracture.   Assessment & Plan:   Principal Problem:   Intraparenchymal hemorrhage of brain (HCC) Active Problems:   Parkinson's disease (HCC)   Dementia   Facial laceration   SAH (subarachnoid hemorrhage) (HCC)   Closed right hip fracture, initial encounter (HCC)   Intraparenchymal hemorrhage Subarachnoid hemorrhage Secondary to traumatic fall with injury to his head. Family states patient is pretty baseline at this point. -Neurosurgery to see today. Recommendations: repeat CT head in AM -neuro checks -may be beneficial to get palliative care involved. Will discuss with family tomorrow  Closed right hip fracture Secondary to fall. -Orthopedic surgery, Dr. Idolina Primer, to see today  Parkinson disease Dementia Patient at baseline currently per son and granddaughter -Continue Sinemet and Aricept  Right maxillary fracture Right facial laceration Non-displaced fracture. Laceration repaired in ED -analgesics prn   DVT prophylaxis: SCDs Code Status: DNR/DNI Family Communication: Son Michael [name]) and granddaughter Disposition Plan: Anticipate discharge to SNF when medically stable   Consultants:   Neurosurgery, Dr. Newell Coral  Orthopedic surgery, Dr. Idolina Primer  Procedures:   Laceration repair  Antimicrobials:  None    Subjective: Patient reports pain on entire right side. No chest pain.  Objective: Vitals:   08/21/16 0800 08/21/16 0830 08/21/16 0930 08/21/16 1015  BP: 108/85 (!) 113/52 117/75 (!) 147/83  Pulse: 60 68 66 65  Resp: Temp:        TempSrc:      SpO2: 97% 95% 95% 95%    Intake/Output Summary (Last 24 hours) at 08/21/16 1026 Last data filed at 08/20/16 2132  Gross per 24 hour  Intake              500 ml  Output                0 ml  Net              500 ml   There were no vitals filed for this visit.  Examination:  General exam: Appears calm and comfortable Respiratory system: Clear to auscultation. Respiratory effort normal. Cardiovascular system: S1 & S2 heard, RRR. No murmurs Gastrointestinal system: Abdomen is nondistended, soft and nontender. Normal bowel sounds heard. Central nervous system: Alert. Could not assess orientation secondary to poor patient participation Extremities: No edema. No calf tenderness. Entire right side is tender to palpation. No obvious lower extremity deformity/rotation. Skin: No cyanosis. Large bruise on right cheek. Stitches in place Psychiatry: Judgement and insight appear impaired. Mood & affect depressed and flat.     Data Reviewed: I have personally reviewed following labs and imaging studies  CBC:  Recent Labs Lab 08/20/16 2000 08/21/16 0320  WBC 12.5* 16.6*  NEUTROABS 10.6*  --   HGB 13.4 12.6*  HCT 41.5 39.6  MCV 90.8 93.0  PLT 295 233   Basic Metabolic Panel:  Recent Labs Lab 08/20/16 2000 08/21/16 0320  NA 144 141  K 3.9 4.4  CL 109 109  CO2 28 21*  GLUCOSE 117* 126*  BUN 24* 23*  CREATININE 0.86 0.99  CALCIUM 10.2 9.6   GFR:  CrCl cannot be calculated (Unknown ideal weight.). Liver Function Tests: No results for input(s): AST, ALT, ALKPHOS, BILITOT, PROT, ALBUMIN in the last 168 hours. No results for input(s): LIPASE, AMYLASE in the last 168 hours. No results for input(s): AMMONIA in the last 168 hours. Coagulation Profile:  Recent Labs Lab 08/20/16 2000  INR 0.93   Cardiac Enzymes: No results for input(s): CKTOTAL, CKMB, CKMBINDEX, TROPONINI in the last 168 hours. BNP (last 3 results) No results for input(s): PROBNP in the last  8760 hours. HbA1C: No results for input(s): HGBA1C in the last 72 hours. CBG: No results for input(s): GLUCAP in the last 168 hours. Lipid Profile: No results for input(s): CHOL, HDL, LDLCALC, TRIG, CHOLHDL, LDLDIRECT in the last 72 hours. Thyroid Function Tests: No results for input(s): TSH, T4TOTAL, FREET4, T3FREE, THYROIDAB in the last 72 hours. Anemia Panel: No results for input(s): VITAMINB12, FOLATE, FERRITIN, TIBC, IRON, RETICCTPCT in the last 72 hours. Sepsis Labs: No results for input(s): PROCALCITON, LATICACIDVEN in the last 168 hours.  No results found for this or any previous visit (from the past 240 hour(s)).       Radiology Studies: Ct Head Wo Contrast  Result Date: 08/20/2016 CLINICAL DATA:  Fall. Head, facial, and neck pain and swelling. Initial encounter. EXAM: CT HEAD WITHOUT CONTRAST CT MAXILLOFACIAL WITHOUT CONTRAST CT CERVICAL SPINE WITHOUT CONTRAST TECHNIQUE: Multidetector CT imaging of the head, cervical spine, and maxillofacial structures were performed using the standard protocol without intravenous contrast. Multiplanar CT image reconstructions of the cervical spine and maxillofacial structures were also generated. COMPARISON:  CT on 02/14/2016 and cervical spine CT on 08/29/2015 FINDINGS: CT HEAD FINDINGS Brain: 7 x 7 mm intraparenchymal hemorrhage seen in the right frontal lobe. Mild subarachnoid hemorrhage is seen in the right frontal and temporal regions, and mild intraventricular hemorrhage is seen in the occipital horns bilaterally. Ventricles remain stable in size. No evidence of acute cerebral infarct, edema, mass effect, or midline shift. Mild to moderate diffuse cerebral atrophy again noted. Vascular: No hyperdense vessel or unexpected calcification. Skull: Normal. Negative for fracture or focal lesion. Other: None. CT MAXILLOFACIAL FINDINGS Osseous: Nondisplaced fracture is seen involving the lateral wall of the right maxillary sinus. No other are facial  bone fractures are identified. Orbits: No evidence of fracture. Globes and other intraorbital anatomy are unremarkable. Sinuses: Right maxillary sinus air-fluid level. Soft tissues: Right frontal, maxillary, and preseptal soft tissue swelling is seen. CT CERVICAL SPINE FINDINGS Alignment: Normal. Skull base and vertebrae: No acute fracture. No primary bone lesion or focal pathologic process. Soft tissues and spinal canal: No prevertebral fluid or swelling. No visible canal hematoma. 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 Mild to moderate degenerative disc disease seen at C5-6. Mild to moderate facet DJD present bilaterally at C4-5 and C5-6. Moderate atlantoaxial degenerative changes also seen. Upper chest: Negative. Other: None. IMPRESSION: 7 x 7 mm intraparenchymal hemorrhage in right frontal lobe. No evidence of mass effect or midline shift. Mild right frontal and temporal subarachnoid hemorrhage. Mild intraventricular hemorrhage in both occipital horns. No evidence of acute hydrocephalus. Nondisplaced fracture of the lateral wall of the right maxillary sinus. No evidence of acute cervical spine fracture or subluxation. Degenerative spondylosis and congenital fusion, as described above. Critical Value/emergent results were called by telephone at the time of interpretation on 08/20/2016 at 8:41 pm to Dr. Trixie Dredge , who verbally acknowledged these results. Electronically Signed   By: Myles Rosenthal M.D.   On: 08/20/2016 20:43  Ct Cervical Spine Wo Contrast  Result Date: 08/20/2016 CLINICAL DATA:  Fall. Head, facial, and neck pain and swelling. Initial encounter. EXAM: CT HEAD WITHOUT CONTRAST CT MAXILLOFACIAL WITHOUT CONTRAST CT CERVICAL SPINE WITHOUT CONTRAST TECHNIQUE: Multidetector CT imaging of the head, cervical spine, and maxillofacial structures were performed using the standard protocol without intravenous contrast. Multiplanar CT image reconstructions of the cervical spine and  maxillofacial structures were also generated. COMPARISON:  CT on 02/14/2016 and cervical spine CT on 08/29/2015 FINDINGS: CT HEAD FINDINGS Brain: 7 x 7 mm intraparenchymal hemorrhage seen in the right frontal lobe. Mild subarachnoid hemorrhage is seen in the right frontal and temporal regions, and mild intraventricular hemorrhage is seen in the occipital horns bilaterally. Ventricles remain stable in size. No evidence of acute cerebral infarct, edema, mass effect, or midline shift. Mild to moderate diffuse cerebral atrophy again noted. Vascular: No hyperdense vessel or unexpected calcification. Skull: Normal. Negative for fracture or focal lesion. Other: None. CT MAXILLOFACIAL FINDINGS Osseous: Nondisplaced fracture is seen involving the lateral wall of the right maxillary sinus. No other are facial bone fractures are identified. Orbits: No evidence of fracture. Globes and other intraorbital anatomy are unremarkable. Sinuses: Right maxillary sinus air-fluid level. Soft tissues: Right frontal, maxillary, and preseptal soft tissue swelling is seen. CT CERVICAL SPINE FINDINGS Alignment: Normal. Skull base and vertebrae: No acute fracture. No primary bone lesion or focal pathologic process. Soft tissues and spinal canal: No prevertebral fluid or swelling. No visible canal hematoma. 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 Mild to moderate degenerative disc disease seen at C5-6. Mild to moderate facet DJD present bilaterally at C4-5 and C5-6. Moderate atlantoaxial degenerative changes also seen. Upper chest: Negative. Other: None. IMPRESSION: 7 x 7 mm intraparenchymal hemorrhage in right frontal lobe. No evidence of mass effect or midline shift. Mild right frontal and temporal subarachnoid hemorrhage. Mild intraventricular hemorrhage in both occipital horns. No evidence of acute hydrocephalus. Nondisplaced fracture of the lateral wall of the right maxillary sinus. No evidence of acute cervical spine  fracture or subluxation. Degenerative spondylosis and congenital fusion, as described above. Critical Value/emergent results were called by telephone at the time of interpretation on 08/20/2016 at 8:41 pm to Dr. Trixie Dredge , who verbally acknowledged these results. Electronically Signed   By: Myles Rosenthal M.D.   On: 08/20/2016 20:43   Dg Chest Port 1 View  Result Date: 08/20/2016 CLINICAL DATA:  Fall.  Right hip fracture.  Pre-op respiratory exam EXAM: PORTABLE CHEST 1 VIEW COMPARISON:  08/31/2015 FINDINGS: Heart size remains normal. Aortic atherosclerosis. Low lung volumes again noted. Scarring again seen in left lung base. No evidence of pulmonary infiltrate or edema. No evidence of pneumothorax or pleural effusion. Several old right rib fracture deformities again noted. IMPRESSION: Stable left basilar scarring.  No active lung disease. Aortic atherosclerosis. Electronically Signed   By: Myles Rosenthal M.D.   On: 08/20/2016 20:12   Dg Hip Unilat W Or Wo Pelvis 2-3 Views Right  Result Date: 08/20/2016 CLINICAL DATA:  Fall.  Right hip pain.  Initial encounter. EXAM: DG HIP (WITH OR WITHOUT PELVIS) 2-3V RIGHT COMPARISON:  None. FINDINGS: Impacted subcapital right femoral neck fracture is seen. No evidence of dislocation. Generalized osteopenia. IMPRESSION: Impacted subcapital right femoral neck fracture. Electronically Signed   By: Myles Rosenthal M.D.   On: 08/20/2016 20:10   Ct Maxillofacial Wo Cm  Result Date: 08/20/2016 CLINICAL DATA:  Fall. Head, facial, and neck pain and swelling. Initial encounter.  EXAM: CT HEAD WITHOUT CONTRAST CT MAXILLOFACIAL WITHOUT CONTRAST CT CERVICAL SPINE WITHOUT CONTRAST TECHNIQUE: Multidetector CT imaging of the head, cervical spine, and maxillofacial structures were performed using the standard protocol without intravenous contrast. Multiplanar CT image reconstructions of the cervical spine and maxillofacial structures were also generated. COMPARISON:  CT on 02/14/2016 and cervical  spine CT on 08/29/2015 FINDINGS: CT HEAD FINDINGS Brain: 7 x 7 mm intraparenchymal hemorrhage seen in the right frontal lobe. Mild subarachnoid hemorrhage is seen in the right frontal and temporal regions, and mild intraventricular hemorrhage is seen in the occipital horns bilaterally. Ventricles remain stable in size. No evidence of acute cerebral infarct, edema, mass effect, or midline shift. Mild to moderate diffuse cerebral atrophy again noted. Vascular: No hyperdense vessel or unexpected calcification. Skull: Normal. Negative for fracture or focal lesion. Other: None. CT MAXILLOFACIAL FINDINGS Osseous: Nondisplaced fracture is seen involving the lateral wall of the right maxillary sinus. No other are facial bone fractures are identified. Orbits: No evidence of fracture. Globes and other intraorbital anatomy are unremarkable. Sinuses: Right maxillary sinus air-fluid level. Soft tissues: Right frontal, maxillary, and preseptal soft tissue swelling is seen. CT CERVICAL SPINE FINDINGS Alignment: Normal. Skull base and vertebrae: No acute fracture. No primary bone lesion or focal pathologic process. Soft tissues and spinal canal: No prevertebral fluid or swelling. No visible canal hematoma. 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 Mild to moderate degenerative disc disease seen at C5-6. Mild to moderate facet DJD present bilaterally at C4-5 and C5-6. Moderate atlantoaxial degenerative changes also seen. Upper chest: Negative. Other: None. IMPRESSION: 7 x 7 mm intraparenchymal hemorrhage in right frontal lobe. No evidence of mass effect or midline shift. Mild right frontal and temporal subarachnoid hemorrhage. Mild intraventricular hemorrhage in both occipital horns. No evidence of acute hydrocephalus. Nondisplaced fracture of the lateral wall of the right maxillary sinus. No evidence of acute cervical spine fracture or subluxation. Degenerative spondylosis and congenital fusion, as described  above. Critical Value/emergent results were called by telephone at the time of interpretation on 08/20/2016 at 8:41 pm to Dr. Trixie Dredge , who verbally acknowledged these results. Electronically Signed   By: Myles Rosenthal M.D.   On: 08/20/2016 20:43        Scheduled Meds: . carbidopa-levodopa  1.5 tablet Oral TID  . cholecalciferol  1,000 Units Oral Daily  . donepezil  10 mg Oral QHS  . multivitamin with minerals  1 tablet Oral Daily  . omega-3 acid ethyl esters  1,000 mg Oral Daily  . pantoprazole  40 mg Oral Daily  . sodium chloride flush  3 mL Intravenous Q12H   Continuous Infusions: . sodium chloride 90 mL/hr at 08/21/16 0326     LOS: 1 day     Jacquelin Hawking, MD [facility] 08/21/2016, 10:26 AM Pager: [phone]  If 7PM-7AM, please contact night-coverage [email or web address] Password [email or web address] 08/21/2016, 10:26 AM

## 2016-08-22 ENCOUNTER — Inpatient Hospital Stay (HOSPITAL_COMMUNITY): Payer: Medicare Other

## 2016-08-22 DIAGNOSIS — H05231 Hemorrhage of right orbit: Secondary | ICD-10-CM

## 2016-08-22 LAB — GLUCOSE, CAPILLARY: Glucose-Capillary: 103 mg/dL — ABNORMAL HIGH (ref 65–99)

## 2016-08-22 MED ORDER — DEXTROSE-NACL 5-0.45 % IV SOLN
INTRAVENOUS | Status: DC
  Administered 2016-08-22 – 2016-08-24 (×4): via INTRAVENOUS

## 2016-08-22 NOTE — Consult Note (Signed)
ORTHOPAEDIC CONSULTATION  REQUESTING PHYSICIAN: Mariel Aloe, MD;[place]*  Chief Complaint: right hip injury  HPI: Michael [name] is a 83 y.o. male who [facility] at [facility]  Past Medical History:  Diagnosis Date  . Hallucinations   . 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 Other vitamin B12 deficiency anemia   . Paralysis agitans ([place])   . Parkinson's disease ([place]) 03/27/2013  . Unspecified transient cerebral ischemia    Past Surgical History:  Procedure Laterality Date  . APPENDECTOMY    . CHOLECYSTECTOMY    . PTCA  1987  . TRANSURETHRAL RESECTION OF PROSTATE     Social History   Social History  . Marital status: Married    Spouse name: N/A  . Number of children: N/A  . Years of education: N/A   Social History Main Topics  . Smoking status: Never Smoker  . Smokeless tobacco: Never Used  . Alcohol use No  . Drug use: No  . Sexual activity: Not Asked   Other Topics Concern  . None   Social History Narrative  . None   Family History  Problem Relation Age of Onset  . Parkinson's disease Sister   . Cancer Brother   . Parkinson's disease Daughter 33   No Known Allergies Prior to Admission medications   Medication Sig Start Date End Date Taking? Authorizing Provider  acetaminophen (TYLENOL) 500 MG tablet Take 500 mg by mouth every 6 (six) hours as needed for moderate pain.    Yes Historical Provider, MD  AMBULATORY NON FORMULARY MEDICATION Magic Cup Sig: One cup by mouth every day with lunch meal for weight loss   Yes Historical Provider, MD  aspirin 325 MG tablet Take 325 mg by mouth daily.    Yes Historical Provider, MD  carbidopa-levodopa (SINEMET IR) 25-100 MG tablet 1.5 tablets by mouth three times daily. Dosing times : 1 hour before meals or 2 hours after meals.    Yes Historical Provider, MD  Cholecalciferol (VITAMIN D-3) 1000 UNITS CAPS Take 1 capsule by mouth daily.    Yes Historical Provider, MD    donepezil (ARICEPT) 10 MG tablet Take 1 tablet by mouth at  bedtime 07/15/15  Yes Star Age, MD  fish oil-omega-3 fatty acids 1000 MG capsule Take 1,000 mg by mouth daily.    Yes Historical Provider, MD  ketoconazole (NIZORAL) 2 % shampoo Apply 1 application topically 2 (two) times a week. Wed/saturday 3-11 shift until resolved   Yes Historical Provider, MD  MALTODEXTRIN-XANTHAN GUM PO Give 1gm by mouth as needed due to high risk for aspiration   Yes Historical Provider, MD  Multiple Vitamin (MULTIVITAMIN WITH MINERALS) TABS tablet Take 1 tablet by mouth daily.   Yes Historical Provider, MD  multivitamin-lutein (OCUVITE-LUTEIN) CAPS capsule Take 1 capsule by mouth daily.   Yes Historical Provider, MD  omeprazole (PRILOSEC) 20 MG capsule Take 20 mg by mouth daily.    Yes Historical Provider, MD   Ct Head Wo Contrast  Result Date: 08/22/2016 CLINICAL DATA:  Intracranial hemorrhage EXAM: CT HEAD WITHOUT CONTRAST TECHNIQUE: Contiguous axial images were obtained from the base of the skull through the vertex without intravenous contrast. COMPARISON:  Head CT 08/20/2016 FINDINGS: Brain: Slight increase in size of right frontal intraparenchymal hematoma, now measuring 10 x 8 mm, previously 7 x 7 mm. There is subarachnoid blood again seen layering along the right anterolateral convexity. The extra-axial CSF space on the right  has increased in size. The size and configuration of the ventricles are unchanged. Small amount of blood is present within both occipital horns. Small amount of subarachnoid blood of along the right parietal lobe appears new from the prior study, possibly owing to redistribution. There is no new intraparenchymal hematoma. No midline shift or herniation. Vascular: No hyperdense vessel or unexpected calcification. Skull: Nondisplaced lateral right maxillary sinus wall fracture, unchanged. Sinuses/Orbits: The visualized portions of the paranasal sinuses and mastoid air cells are free of fluid.  No advanced mucosal thickening. The visualized orbits are normal. Other: None IMPRESSION: 1. Minimally increased size of right frontal intraparenchymal hematoma, possibly exaggerated by differences in slice selection. No new intraparenchymal blood. 2. Unchanged right frontal convexity subarachnoid blood. New right parietal subarachnoid blood products may be secondary to redistribution. 3. Unchanged layering blood in the occipital horns without hydrocephalus. 4. No midline shift or herniation. 5. Increased size of the right hemispheric extra-axial CSF space. This may indicate the presence of subdural hygroma. Electronically Signed   By: Ulyses Jarred M.D.   On: 08/22/2016 07:10   Ct Head Wo Contrast  Result Date: 08/20/2016 CLINICAL DATA:  Fall. Head, facial, and neck pain and swelling. Initial encounter. EXAM: CT HEAD WITHOUT CONTRAST CT MAXILLOFACIAL WITHOUT CONTRAST CT CERVICAL SPINE WITHOUT CONTRAST TECHNIQUE: Multidetector CT imaging of the head, cervical spine, and maxillofacial structures were performed using the standard protocol without intravenous contrast. Multiplanar CT image reconstructions of the cervical spine and maxillofacial structures were also generated. COMPARISON:  CT on 02/14/2016 and cervical spine CT on 08/29/2015 FINDINGS: CT HEAD FINDINGS Brain: 7 x 7 mm intraparenchymal hemorrhage seen in the right frontal lobe. Mild subarachnoid hemorrhage is seen in the right frontal and temporal regions, and mild intraventricular hemorrhage is seen in the occipital horns bilaterally. Ventricles remain stable in size. No evidence of acute cerebral infarct, edema, mass effect, or midline shift. Mild to moderate diffuse cerebral atrophy again noted. Vascular: No hyperdense vessel or unexpected calcification. Skull: Normal. Negative for fracture or focal lesion. Other: None. CT MAXILLOFACIAL FINDINGS Osseous: Nondisplaced fracture is seen involving the lateral wall of the right maxillary sinus. No other  are facial bone fractures are identified. Orbits: No evidence of fracture. Globes and other intraorbital anatomy are unremarkable. Sinuses: Right maxillary sinus air-fluid level. Soft tissues: Right frontal, maxillary, and preseptal soft tissue swelling is seen. CT CERVICAL SPINE FINDINGS Alignment: Normal. Skull base and vertebrae: No acute fracture. No primary bone lesion or focal pathologic process. Soft tissues and spinal canal: No prevertebral fluid or swelling. No visible canal hematoma. 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 Mild to moderate degenerative disc disease seen at C5-6. Mild to moderate facet DJD present bilaterally at C4-5 and C5-6. Moderate atlantoaxial degenerative changes also seen. Upper chest: Negative. Other: None. IMPRESSION: 7 x 7 mm intraparenchymal hemorrhage in right frontal lobe. No evidence of mass effect or midline shift. Mild right frontal and temporal subarachnoid hemorrhage. Mild intraventricular hemorrhage in both occipital horns. No evidence of acute hydrocephalus. Nondisplaced fracture of the lateral wall of the right maxillary sinus. No evidence of acute cervical spine fracture or subluxation. Degenerative spondylosis and congenital fusion, as described above. Critical Value/emergent results were called by telephone at the time of interpretation on 08/20/2016 at 8:41 pm to Dr. Clayton Bibles , who verbally acknowledged these results. Electronically Signed   By: Earle Gell M.D.   On: 08/20/2016 20:43   Ct Cervical Spine Wo Contrast  Result Date:  08/20/2016 CLINICAL DATA:  Fall. Head, facial, and neck pain and swelling. Initial encounter. EXAM: CT HEAD WITHOUT CONTRAST CT MAXILLOFACIAL WITHOUT CONTRAST CT CERVICAL SPINE WITHOUT CONTRAST TECHNIQUE: Multidetector CT imaging of the head, cervical spine, and maxillofacial structures were performed using the standard protocol without intravenous contrast. Multiplanar CT image reconstructions of the cervical spine  and maxillofacial structures were also generated. COMPARISON:  CT on 02/14/2016 and cervical spine CT on 08/29/2015 FINDINGS: CT HEAD FINDINGS Brain: 7 x 7 mm intraparenchymal hemorrhage seen in the right frontal lobe. Mild subarachnoid hemorrhage is seen in the right frontal and temporal regions, and mild intraventricular hemorrhage is seen in the occipital horns bilaterally. Ventricles remain stable in size. No evidence of acute cerebral infarct, edema, mass effect, or midline shift. Mild to moderate diffuse cerebral atrophy again noted. Vascular: No hyperdense vessel or unexpected calcification. Skull: Normal. Negative for fracture or focal lesion. Other: None. CT MAXILLOFACIAL FINDINGS Osseous: Nondisplaced fracture is seen involving the lateral wall of the right maxillary sinus. No other are facial bone fractures are identified. Orbits: No evidence of fracture. Globes and other intraorbital anatomy are unremarkable. Sinuses: Right maxillary sinus air-fluid level. Soft tissues: Right frontal, maxillary, and preseptal soft tissue swelling is seen. CT CERVICAL SPINE FINDINGS Alignment: Normal. Skull base and vertebrae: No acute fracture. No primary bone lesion or focal pathologic process. Soft tissues and spinal canal: No prevertebral fluid or swelling. No visible canal hematoma. 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 Mild to moderate degenerative disc disease seen at C5-6. Mild to moderate facet DJD present bilaterally at C4-5 and C5-6. Moderate atlantoaxial degenerative changes also seen. Upper chest: Negative. Other: None. IMPRESSION: 7 x 7 mm intraparenchymal hemorrhage in right frontal lobe. No evidence of mass effect or midline shift. Mild right frontal and temporal subarachnoid hemorrhage. Mild intraventricular hemorrhage in both occipital horns. No evidence of acute hydrocephalus. Nondisplaced fracture of the lateral wall of the right maxillary sinus. No evidence of acute cervical  spine fracture or subluxation. Degenerative spondylosis and congenital fusion, as described above. Critical Value/emergent results were called by telephone at the time of interpretation on 08/20/2016 at 8:41 pm to Dr. Trixie Dredge , who verbally acknowledged these results. Electronically Signed   By: Myles Rosenthal M.D.   On: 08/20/2016 20:43   Dg Chest Port 1 View  Result Date: 08/20/2016 CLINICAL DATA:  Fall.  Right hip fracture.  Pre-op respiratory exam EXAM: PORTABLE CHEST 1 VIEW COMPARISON:  08/31/2015 FINDINGS: Heart size remains normal. Aortic atherosclerosis. Low lung volumes again noted. Scarring again seen in left lung base. No evidence of pulmonary infiltrate or edema. No evidence of pneumothorax or pleural effusion. Several old right rib fracture deformities again noted. IMPRESSION: Stable left basilar scarring.  No active lung disease. Aortic atherosclerosis. Electronically Signed   By: Myles Rosenthal M.D.   On: 08/20/2016 20:12   Dg Hip Unilat W Or Wo Pelvis 2-3 Views Right  Result Date: 08/20/2016 CLINICAL DATA:  Fall.  Right hip pain.  Initial encounter. EXAM: DG HIP (WITH OR WITHOUT PELVIS) 2-3V RIGHT COMPARISON:  None. FINDINGS: Impacted subcapital right femoral neck fracture is seen. No evidence of dislocation. Generalized osteopenia. IMPRESSION: Impacted subcapital right femoral neck fracture. Electronically Signed   By: Myles Rosenthal M.D.   On: 08/20/2016 20:10   Ct Maxillofacial Wo Cm  Result Date: 08/20/2016 CLINICAL DATA:  Fall. Head, facial, and neck pain and swelling. Initial encounter. EXAM: CT HEAD WITHOUT CONTRAST CT MAXILLOFACIAL WITHOUT  CONTRAST CT CERVICAL SPINE WITHOUT CONTRAST TECHNIQUE: Multidetector CT imaging of the head, cervical spine, and maxillofacial structures were performed using the standard protocol without intravenous contrast. Multiplanar CT image reconstructions of the cervical spine and maxillofacial structures were also generated. COMPARISON:  CT on 02/14/2016 and  cervical spine CT on 08/29/2015 FINDINGS: CT HEAD FINDINGS Brain: 7 x 7 mm intraparenchymal hemorrhage seen in the right frontal lobe. Mild subarachnoid hemorrhage is seen in the right frontal and temporal regions, and mild intraventricular hemorrhage is seen in the occipital horns bilaterally. Ventricles remain stable in size. No evidence of acute cerebral infarct, edema, mass effect, or midline shift. Mild to moderate diffuse cerebral atrophy again noted. Vascular: No hyperdense vessel or unexpected calcification. Skull: Normal. Negative for fracture or focal lesion. Other: None. CT MAXILLOFACIAL FINDINGS Osseous: Nondisplaced fracture is seen involving the lateral wall of the right maxillary sinus. No other are facial bone fractures are identified. Orbits: No evidence of fracture. Globes and other intraorbital anatomy are unremarkable. Sinuses: Right maxillary sinus air-fluid level. Soft tissues: Right frontal, maxillary, and preseptal soft tissue swelling is seen. CT CERVICAL SPINE FINDINGS Alignment: Normal. Skull base and vertebrae: No acute fracture. No primary bone lesion or focal pathologic process. Soft tissues and spinal canal: No prevertebral fluid or swelling. No visible canal hematoma. 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 Mild to moderate degenerative disc disease seen at C5-6. Mild to moderate facet DJD present bilaterally at C4-5 and C5-6. Moderate atlantoaxial degenerative changes also seen. Upper chest: Negative. Other: None. IMPRESSION: 7 x 7 mm intraparenchymal hemorrhage in right frontal lobe. No evidence of mass effect or midline shift. Mild right frontal and temporal subarachnoid hemorrhage. Mild intraventricular hemorrhage in both occipital horns. No evidence of acute hydrocephalus. Nondisplaced fracture of the lateral wall of the right maxillary sinus. No evidence of acute cervical spine fracture or subluxation. Degenerative spondylosis and congenital fusion, as  described above. Critical Value/emergent results were called by telephone at the time of interpretation on 08/20/2016 at 8:41 pm to Dr. Trixie Dredge , who verbally acknowledged these results. Electronically Signed   By: Myles Rosenthal M.D.   On: 08/20/2016 20:43    Positive ROS: All other systems have been reviewed and were otherwise negative with the exception of those mentioned in the HPI and as above.  Labs cbc  Recent Labs  08/20/16 2000 08/21/16 0320  WBC 12.5* 16.6*  HGB 13.4 12.6*  HCT 41.5 39.6  PLT 295 233    Labs inflam No results for input(s): CRP in the last 72 hours.  Invalid input(s): ESR  Labs coag  Recent Labs  08/20/16 2000  INR 0.93     Recent Labs  08/20/16 2000 08/21/16 0320  NA 144 141  K 3.9 4.4  CL 109 109  CO2 28 21*  GLUCOSE 117* 126*  BUN 24* 23*  CREATININE 0.86 0.99  CALCIUM 10.2 9.6    Physical Exam: Vitals:   08/22/16 0711 08/22/16 1100  BP: (!) 118/56 (!) 137/120  Pulse: 72 78  Resp: 19 17  Temp: 99.9 F (37.7 C) 99.6 F (37.6 C)   General: somnolent, no acute distress Cardiovascular: No pedal edema Respiratory: No cyanosis, no use of accessory musculature GI: No organomegaly, abdomen is soft and non-tender Skin: No lesions in the area of chief complaint other than those listed below in MSK exam.  Neurologic: Sensation intact distally save for the below mentioned MSK exam Psychiatric: Patient is demented Lymphatic: No  axillary or cervical lymphadenopathy  MUSCULOSKELETAL:  Painless small arc motion, palpable pulses Other extremities are atraumatic with painless ROM and NVI.  Assessment: R impacted femoral neck fracture  Plan: I had a long discussion with the family about the risks and benefits of OR vs non-operative management of this and they have elected for comfort care.   I would hold of on PT or manual manipulation of the RLE for 2 wks.  He is nonambulatory at baseline, wheelchair positioning is ok   f/u with  ime in 1-2wks   Renette Butters, MD Cell [phone]   08/22/2016 11:32 AM

## 2016-08-22 NOTE — Progress Notes (Signed)
PROGRESS NOTE    CHALMERS IDDINGS  ZOX:[id number] DOB: June 13, 1927 DOA: 08/20/2016 PCP: PROVIDER NOT IN SYSTEM   Brief Narrative: Michael [name] is a 83 y.o. male with a medical history of Parkinson disease, dementia and GERD. He presented after having a traumatic fall at his SNF. He does not recall the event. He was found to have an intraparenchymal/temporal subarachnoid hemorrhage, nondisplaced maxillary fracture.   Assessment & Plan:   Principal Problem:   Intraparenchymal hemorrhage of brain (HCC) Active Problems:   Parkinson's disease (HCC)   Dementia   Facial laceration   SAH (subarachnoid hemorrhage) (HCC)   Closed right hip fracture, initial encounter (HCC)   Intraparenchymal hemorrhage Subarachnoid hemorrhage Secondary to traumatic fall with injury to his head. Family states patient is pretty baseline at this point. Family choosing no surgery. CT head stable. -palliative medicine consult  Closed right hip fracture Secondary to fall. Orthopedic surgery discussed with family. Decision for no surgery  Parkinson disease Dementia Patient at baseline currently per son and granddaughter -Continue Sinemet and Aricept  Right maxillary fracture Right facial laceration Non-displaced fracture. Laceration repaired in ED -analgesics prn  Decreased breath sounds on left -CXR   DVT prophylaxis: SCDs Code Status: DNR/DNI Family Communication: Son Youth worker) and granddaughter Disposition Plan: Anticipate discharge to SNF when medically stable   Consultants:   Neurosurgery, Dr. Newell Coral  Orthopedic surgery, Dr. Idolina Primer  Palliative medicine  Procedures:   Laceration repair  Antimicrobials:  None    Subjective: Altered today.  Objective: Vitals:   08/22/16 0422 08/22/16 0600 08/22/16 0711 08/22/16 1100  BP: (!) 115/97 (!) 137/115 (!) 118/56 (!) 137/120  Pulse: 71 78 72 78  Resp: Temp: 99.9 F (37.7 C)  99.9 F (37.7 C) 99.6 F (37.6 C)    TempSrc: Oral  Oral Oral  SpO2: 94% 94% 91% 94%  Weight: 60.2 kg (132 lb 12.8 oz)     Height:        Intake/Output Summary (Last 24 hours) at 08/22/16 1256 Last data filed at 08/21/16 2054  Gross per 24 hour  Intake                0 ml  Output              350 ml  Net             -350 ml   Filed Weights   08/21/16 1200 08/22/16 0422  Weight: 64.4 kg (142 lb) 60.2 kg (132 lb 12.8 oz)    Examination:  General exam: Appears calm and comfortable Respiratory system: Clear to auscultation, decreased on left. Respiratory effort normal. Cardiovascular system: S1 & S2 heard, RRR. No murmurs Gastrointestinal system: Abdomen is nondistended, soft and nontender. Normal bowel sounds heard. Central nervous system: Altered, somnolent Extremities: No edema. No calf tenderness. Entire right side is tender to palpation. No obvious lower extremity deformity/rotation. Skin: No cyanosis. Large bruise on right cheek. Stitches in place    Data Reviewed: I have personally reviewed following labs and imaging studies  CBC:  Recent Labs Lab 08/20/16 2000 08/21/16 0320  WBC 12.5* 16.6*  NEUTROABS 10.6*  --   HGB 13.4 12.6*  HCT 41.5 39.6  MCV 90.8 93.0  PLT 295 233   Basic Metabolic Panel:  Recent Labs Lab 08/20/16 2000 08/21/16 0320  NA 144 141  K 3.9 4.4  CL 109 109  CO2 28 21*  GLUCOSE 117* 126*  BUN 24* 23*  CREATININE 0.86 0.99  CALCIUM 10.2 9.6   GFR: Estimated Creatinine Clearance: 41.4 mL/min (by C-G formula based on SCr of 0.99 mg/dL). Liver Function Tests: No results for input(s): AST, ALT, ALKPHOS, BILITOT, PROT, ALBUMIN in the last 168 hours. No results for input(s): LIPASE, AMYLASE in the last 168 hours. No results for input(s): AMMONIA in the last 168 hours. Coagulation Profile:  Recent Labs Lab 08/20/16 2000  INR 0.93   Cardiac Enzymes:  Recent Labs Lab 08/21/16 1038  TROPONINI <0.03   BNP (last 3 results) No results for input(s): PROBNP in the  last 8760 hours. HbA1C: No results for input(s): HGBA1C in the last 72 hours. CBG:  Recent Labs Lab 08/22/16 0818  GLUCAP 103*   Lipid Profile: No results for input(s): CHOL, HDL, LDLCALC, TRIG, CHOLHDL, LDLDIRECT in the last 72 hours. Thyroid Function Tests: No results for input(s): TSH, T4TOTAL, FREET4, T3FREE, THYROIDAB in the last 72 hours. Anemia Panel: No results for input(s): VITAMINB12, FOLATE, FERRITIN, TIBC, IRON, RETICCTPCT in the last 72 hours. Sepsis Labs: No results for input(s): PROCALCITON, LATICACIDVEN in the last 168 hours.  Recent Results (from the past 240 hour(s))  MRSA PCR Screening     Status: None   Collection Time: 08/21/16  6:46 PM  Result Value Ref Range Status   MRSA by PCR NEGATIVE NEGATIVE Final    Comment:        The GeneXpert MRSA Assay (FDA approved for NASAL specimens only), is one component of a comprehensive MRSA colonization surveillance program. It is not intended to diagnose MRSA infection nor to guide or monitor treatment for MRSA infections.          Radiology Studies: Ct Head Wo Contrast  Result Date: 08/22/2016 CLINICAL DATA:  Intracranial hemorrhage EXAM: CT HEAD WITHOUT CONTRAST TECHNIQUE: Contiguous axial images were obtained from the base of the skull through the vertex without intravenous contrast. COMPARISON:  Head CT 08/20/2016 FINDINGS: Brain: Slight increase in size of right frontal intraparenchymal hematoma, now measuring 10 x 8 mm, previously 7 x 7 mm. There is subarachnoid blood again seen layering along the right anterolateral convexity. The extra-axial CSF space on the right has increased in size. The size and configuration of the ventricles are unchanged. Small amount of blood is present within both occipital horns. Small amount of subarachnoid blood of along the right parietal lobe appears new from the prior study, possibly owing to redistribution. There is no new intraparenchymal hematoma. No midline shift or  herniation. Vascular: No hyperdense vessel or unexpected calcification. Skull: Nondisplaced lateral right maxillary sinus wall fracture, unchanged. Sinuses/Orbits: The visualized portions of the paranasal sinuses and mastoid air cells are free of fluid. No advanced mucosal thickening. The visualized orbits are normal. Other: None IMPRESSION: 1. Minimally increased size of right frontal intraparenchymal hematoma, possibly exaggerated by differences in slice selection. No new intraparenchymal blood. 2. Unchanged right frontal convexity subarachnoid blood. New right parietal subarachnoid blood products may be secondary to redistribution. 3. Unchanged layering blood in the occipital horns without hydrocephalus. 4. No midline shift or herniation. 5. Increased size of the right hemispheric extra-axial CSF space. This may indicate the presence of subdural hygroma. Electronically Signed   By: Deatra Robinson M.D.   On: 08/22/2016 07:10   Ct Head Wo Contrast  Result Date: 08/20/2016 CLINICAL DATA:  Fall. Head, facial, and neck pain and swelling. Initial encounter. EXAM: CT HEAD WITHOUT CONTRAST CT MAXILLOFACIAL WITHOUT CONTRAST CT CERVICAL SPINE WITHOUT CONTRAST TECHNIQUE: Multidetector CT imaging of  the head, cervical spine, and maxillofacial structures were performed using the standard protocol without intravenous contrast. Multiplanar CT image reconstructions of the cervical spine and maxillofacial structures were also generated. COMPARISON:  CT on 02/14/2016 and cervical spine CT on 08/29/2015 FINDINGS: CT HEAD FINDINGS Brain: 7 x 7 mm intraparenchymal hemorrhage seen in the right frontal lobe. Mild subarachnoid hemorrhage is seen in the right frontal and temporal regions, and mild intraventricular hemorrhage is seen in the occipital horns bilaterally. Ventricles remain stable in size. No evidence of acute cerebral infarct, edema, mass effect, or midline shift. Mild to moderate diffuse cerebral atrophy again noted.  Vascular: No hyperdense vessel or unexpected calcification. Skull: Normal. Negative for fracture or focal lesion. Other: None. CT MAXILLOFACIAL FINDINGS Osseous: Nondisplaced fracture is seen involving the lateral wall of the right maxillary sinus. No other are facial bone fractures are identified. Orbits: No evidence of fracture. Globes and other intraorbital anatomy are unremarkable. Sinuses: Right maxillary sinus air-fluid level. Soft tissues: Right frontal, maxillary, and preseptal soft tissue swelling is seen. CT CERVICAL SPINE FINDINGS Alignment: Normal. Skull base and vertebrae: No acute fracture. No primary bone lesion or focal pathologic process. Soft tissues and spinal canal: No prevertebral fluid or swelling. No visible canal hematoma. 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 Mild to moderate degenerative disc disease seen at C5-6. Mild to moderate facet DJD present bilaterally at C4-5 and C5-6. Moderate atlantoaxial degenerative changes also seen. Upper chest: Negative. Other: None. IMPRESSION: 7 x 7 mm intraparenchymal hemorrhage in right frontal lobe. No evidence of mass effect or midline shift. Mild right frontal and temporal subarachnoid hemorrhage. Mild intraventricular hemorrhage in both occipital horns. No evidence of acute hydrocephalus. Nondisplaced fracture of the lateral wall of the right maxillary sinus. No evidence of acute cervical spine fracture or subluxation. Degenerative spondylosis and congenital fusion, as described above. Critical Value/emergent results were called by telephone at the time of interpretation on 08/20/2016 at 8:41 pm to Dr. Trixie Dredge , who verbally acknowledged these results. Electronically Signed   By: Myles Rosenthal M.D.   On: 08/20/2016 20:43   Ct Cervical Spine Wo Contrast  Result Date: 08/20/2016 CLINICAL DATA:  Fall. Head, facial, and neck pain and swelling. Initial encounter. EXAM: CT HEAD WITHOUT CONTRAST CT MAXILLOFACIAL WITHOUT CONTRAST CT  CERVICAL SPINE WITHOUT CONTRAST TECHNIQUE: Multidetector CT imaging of the head, cervical spine, and maxillofacial structures were performed using the standard protocol without intravenous contrast. Multiplanar CT image reconstructions of the cervical spine and maxillofacial structures were also generated. COMPARISON:  CT on 02/14/2016 and cervical spine CT on 08/29/2015 FINDINGS: CT HEAD FINDINGS Brain: 7 x 7 mm intraparenchymal hemorrhage seen in the right frontal lobe. Mild subarachnoid hemorrhage is seen in the right frontal and temporal regions, and mild intraventricular hemorrhage is seen in the occipital horns bilaterally. Ventricles remain stable in size. No evidence of acute cerebral infarct, edema, mass effect, or midline shift. Mild to moderate diffuse cerebral atrophy again noted. Vascular: No hyperdense vessel or unexpected calcification. Skull: Normal. Negative for fracture or focal lesion. Other: None. CT MAXILLOFACIAL FINDINGS Osseous: Nondisplaced fracture is seen involving the lateral wall of the right maxillary sinus. No other are facial bone fractures are identified. Orbits: No evidence of fracture. Globes and other intraorbital anatomy are unremarkable. Sinuses: Right maxillary sinus air-fluid level. Soft tissues: Right frontal, maxillary, and preseptal soft tissue swelling is seen. CT CERVICAL SPINE FINDINGS Alignment: Normal. Skull base and vertebrae: No acute fracture. No primary bone  lesion or focal pathologic process. Soft tissues and spinal canal: No prevertebral fluid or swelling. No visible canal hematoma. 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 Mild to moderate degenerative disc disease seen at C5-6. Mild to moderate facet DJD present bilaterally at C4-5 and C5-6. Moderate atlantoaxial degenerative changes also seen. Upper chest: Negative. Other: None. IMPRESSION: 7 x 7 mm intraparenchymal hemorrhage in right frontal lobe. No evidence of mass effect or midline  shift. Mild right frontal and temporal subarachnoid hemorrhage. Mild intraventricular hemorrhage in both occipital horns. No evidence of acute hydrocephalus. Nondisplaced fracture of the lateral wall of the right maxillary sinus. No evidence of acute cervical spine fracture or subluxation. Degenerative spondylosis and congenital fusion, as described above. Critical Value/emergent results were called by telephone at the time of interpretation on 08/20/2016 at 8:41 pm to Dr. Trixie Dredge , who verbally acknowledged these results. Electronically Signed   By: Myles Rosenthal M.D.   On: 08/20/2016 20:43   Dg Chest Port 1 View  Result Date: 08/20/2016 CLINICAL DATA:  Fall.  Right hip fracture.  Pre-op respiratory exam EXAM: PORTABLE CHEST 1 VIEW COMPARISON:  08/31/2015 FINDINGS: Heart size remains normal. Aortic atherosclerosis. Low lung volumes again noted. Scarring again seen in left lung base. No evidence of pulmonary infiltrate or edema. No evidence of pneumothorax or pleural effusion. Several old right rib fracture deformities again noted. IMPRESSION: Stable left basilar scarring.  No active lung disease. Aortic atherosclerosis. Electronically Signed   By: Myles Rosenthal M.D.   On: 08/20/2016 20:12   Dg Hip Unilat W Or Wo Pelvis 2-3 Views Right  Result Date: 08/20/2016 CLINICAL DATA:  Fall.  Right hip pain.  Initial encounter. EXAM: DG HIP (WITH OR WITHOUT PELVIS) 2-3V RIGHT COMPARISON:  None. FINDINGS: Impacted subcapital right femoral neck fracture is seen. No evidence of dislocation. Generalized osteopenia. IMPRESSION: Impacted subcapital right femoral neck fracture. Electronically Signed   By: Myles Rosenthal M.D.   On: 08/20/2016 20:10   Ct Maxillofacial Wo Cm  Result Date: 08/20/2016 CLINICAL DATA:  Fall. Head, facial, and neck pain and swelling. Initial encounter. EXAM: CT HEAD WITHOUT CONTRAST CT MAXILLOFACIAL WITHOUT CONTRAST CT CERVICAL SPINE WITHOUT CONTRAST TECHNIQUE: Multidetector CT imaging of the head,  cervical spine, and maxillofacial structures were performed using the standard protocol without intravenous contrast. Multiplanar CT image reconstructions of the cervical spine and maxillofacial structures were also generated. COMPARISON:  CT on 02/14/2016 and cervical spine CT on 08/29/2015 FINDINGS: CT HEAD FINDINGS Brain: 7 x 7 mm intraparenchymal hemorrhage seen in the right frontal lobe. Mild subarachnoid hemorrhage is seen in the right frontal and temporal regions, and mild intraventricular hemorrhage is seen in the occipital horns bilaterally. Ventricles remain stable in size. No evidence of acute cerebral infarct, edema, mass effect, or midline shift. Mild to moderate diffuse cerebral atrophy again noted. Vascular: No hyperdense vessel or unexpected calcification. Skull: Normal. Negative for fracture or focal lesion. Other: None. CT MAXILLOFACIAL FINDINGS Osseous: Nondisplaced fracture is seen involving the lateral wall of the right maxillary sinus. No other are facial bone fractures are identified. Orbits: No evidence of fracture. Globes and other intraorbital anatomy are unremarkable. Sinuses: Right maxillary sinus air-fluid level. Soft tissues: Right frontal, maxillary, and preseptal soft tissue swelling is seen. CT CERVICAL SPINE FINDINGS Alignment: Normal. Skull base and vertebrae: No acute fracture. No primary bone lesion or focal pathologic process. Soft tissues and spinal canal: No prevertebral fluid or swelling. No visible canal hematoma. Disc levels: Congenital  fusion seen at C2-3, C3-4, and C6-7. Mild to moderate degenerative disc disease seen at C5-6. Mild to moderate facet DJD present bilaterally at C4-5 and C5-6. Moderate atlantoaxial degenerative changes also seen. Upper chest: Negative. Other: None. IMPRESSION: 7 x 7 mm intraparenchymal hemorrhage in right frontal lobe. No evidence of mass effect or midline shift. Mild right frontal and temporal subarachnoid hemorrhage. Mild intraventricular  hemorrhage in both occipital horns. No evidence of acute hydrocephalus. Nondisplaced fracture of the lateral wall of the right maxillary sinus. No evidence of acute cervical spine fracture or subluxation. Degenerative spondylosis and congenital fusion, as described above. Critical Value/emergent results were called by telephone at the time of interpretation on 08/20/2016 at 8:41 pm to Dr. Trixie Dredge , who verbally acknowledged these results. Electronically Signed   By: Myles Rosenthal M.D.   On: 08/20/2016 20:43        Scheduled Meds: . carbidopa-levodopa  1.5 tablet Oral TID  . cholecalciferol  1,000 Units Oral Daily  . donepezil  10 mg Oral QHS  . multivitamin with minerals  1 tablet Oral Daily  . omega-3 acid ethyl esters  1,000 mg Oral Daily  . pantoprazole  40 mg Oral Daily  . sodium chloride flush  3 mL Intravenous Q12H   Continuous Infusions:    LOS: 2 days     Jacquelin Hawking, MD [facility] 08/22/2016, 12:56 PM Pager: [phone]  If 7PM-7AM, please contact night-coverage [email or web address] Password [facility] 08/22/2016, 12:56 PM

## 2016-08-22 NOTE — Clinical Social Work Note (Addendum)
Clinical Social Work Assessment  Patient Details  Name: Michael [name] MRN: [id number] Date of Birth: 10/02/1927  Date of referral:  08/22/16               Reason for consult:  Discharge Planning (admitted from [facility])                Permission sought to share information with:  Case Manager, Magazine features editor, Family Supports Permission granted to share information::  Yes, Verbal Permission Granted  Name::        Agency::  [facility]  Relationship::  Onalee Hua, Son  Contact Information:     Housing/Transportation Living arrangements for the past 2 months:  Skilled Building surveyor of Information:  Development worker, community, Sports coach, Power of Pensions consultant, Adult Children, Facility Patient Interpreter Needed:  None Criminal Activity/Legal Involvement Pertinent to Current Situation/Hospitalization:  No - Comment as needed Significant Relationships:  Adult Children, Other Family Members, Community Support Lives with:  Facility Resident Do you feel safe going back to the place where you live?  Yes Need for family participation in patient care:  Yes (Comment) confusion  Care giving concerns:  LCSW was unable to speak with patient due to cognition. Call placed to [facility] son Onalee Hua with regards to plans at discharge and care at facility.  At this time patient is a LTC patient at [facility]. He plans for return at facility and working with facility on understanding fall.  Currently family has decided not to purse surgery for patient.  Patient is total care at this time due to fall and comfort care.   Social Worker assessment / plan:  LCSW consulted for discharge planning. Patient admitted from SNF: [facility]. Call placed to son, assessment completed. Son is meeting with facility regarding concerns and return.  Plan:  At this time: return to SNF per son.    Employment status:  Retired Database administrator PT Recommendations:  Not assessed at this  time Information / Referral to community resources:  Skilled Nursing Facility  Patient/Family's Response to care:  Agreeable to plan, understands if plans change (such as going home, comfort, he will communicate needs)  Patient/Family's Understanding of and Emotional Response to Diagnosis, Current Treatment, and Prognosis:  Son very involved and proactive. Son reports " I am over at [facility] and will be having a prayer with them regarding care".  Son understands current prognosis and treatment.  Emotional Assessment Appearance:  Appears stated age Attitude/Demeanor/Rapport:    Affect (typically observed):  Unable to Assess (confused, unable to particiapte) Orientation:  Oriented to Self Alcohol / Substance use:  Not Applicable Psych involvement (Current and /or in the community):  No (Comment)  Discharge Needs  Concerns to be addressed:  Denies Needs/Concerns at this time Readmission within the last 30 days:  No Current discharge risk:  None Barriers to Discharge:  No Barriers Identified, Continued Medical Work up   Raye Sorrow, LCSW 08/22/2016, 12:12 PM

## 2016-08-22 NOTE — Progress Notes (Signed)
Subjective: Patient resting in bed, appears comfortable. IV and 75 mL per hour. Follow-up CT of brain swelling shows no change in minimal traumatic subarachnoid hemorrhage, and minimal increased size in small right frontal cerebral hemorrhage. No mass effect. Most notable finding again is of significant generalized atrophy.  Objective: Vital signs in last 24 hours: Vitals:   08/22/16 0600 08/22/16 0711 08/22/16 1100 08/22/16 1500  BP: (!) 137/115 (!) 118/56 (!) 137/120 113/87  Pulse: 78 72 78 73  Resp: Temp:  99.9 F (37.7 C) 99.6 F (37.6 C) 99.4 F (37.4 C)  TempSrc:  Oral Oral Oral  SpO2: 94% 91% 94% 93%  Weight:      Height:        Intake/Output from previous day: 04/09 0701 - 04/10 0700 In: -  Out: 350 [Urine:350] Intake/Output this shift: Total I/O In: -  Out: 350 [Urine:350]  Physical Exam:  Opens eyes weakly to voice. Pupils round, reactive to light. Purposeful movement of extremities, did follow commands:  holding up 2 fingers of right hand to command.  CBC  Recent Labs  08/20/16 2000 08/21/16 0320  WBC 12.5* 16.6*  HGB 13.4 12.6*  HCT 41.5 39.6  PLT 295 233   BMET  Recent Labs  08/20/16 2000 08/21/16 0320  NA 144 141  K 3.9 4.4  CL 109 109  CO2 28 21*  GLUCOSE 117* 126*  BUN 24* 23*  CREATININE 0.86 0.99  CALCIUM 10.2 9.6   ABG    Component Value Date/Time   PHART 7.413 08/29/2015 1935   PCO2ART 39.5 08/29/2015 1935   PO2ART 72.3 (L) 08/29/2015 1935   HCO3 24.8 (H) 08/29/2015 1935   TCO2 22.4 08/29/2015 1935   O2SAT 94.5 08/29/2015 1935    Studies/Results: Ct Head Wo Contrast  Result Date: 08/22/2016 CLINICAL DATA:  Intracranial hemorrhage EXAM: CT HEAD WITHOUT CONTRAST TECHNIQUE: Contiguous axial images were obtained from the base of the skull through the vertex without intravenous contrast. COMPARISON:  Head CT 08/20/2016 FINDINGS: Brain: Slight increase in size of right frontal intraparenchymal hematoma, now measuring 10  x 8 mm, previously 7 x 7 mm. There is subarachnoid blood again seen layering along the right anterolateral convexity. The extra-axial CSF space on the right has increased in size. The size and configuration of the ventricles are unchanged. Small amount of blood is present within both occipital horns. Small amount of subarachnoid blood of along the right parietal lobe appears new from the prior study, possibly owing to redistribution. There is no new intraparenchymal hematoma. No midline shift or herniation. Vascular: No hyperdense vessel or unexpected calcification. Skull: Nondisplaced lateral right maxillary sinus wall fracture, unchanged. Sinuses/Orbits: The visualized portions of the paranasal sinuses and mastoid air cells are free of fluid. No advanced mucosal thickening. The visualized orbits are normal. Other: None IMPRESSION: 1. Minimally increased size of right frontal intraparenchymal hematoma, possibly exaggerated by differences in slice selection. No new intraparenchymal blood. 2. Unchanged right frontal convexity subarachnoid blood. New right parietal subarachnoid blood products may be secondary to redistribution. 3. Unchanged layering blood in the occipital horns without hydrocephalus. 4. No midline shift or herniation. 5. Increased size of the right hemispheric extra-axial CSF space. This may indicate the presence of subdural hygroma. Electronically Signed   By: Deatra Robinson M.D.   On: 08/22/2016 07:10   Ct Head Wo Contrast  Result Date: 08/20/2016 CLINICAL DATA:  Fall. Head, facial, and neck pain and swelling. Initial encounter. EXAM: CT  HEAD WITHOUT CONTRAST CT MAXILLOFACIAL WITHOUT CONTRAST CT CERVICAL SPINE WITHOUT CONTRAST TECHNIQUE: Multidetector CT imaging of the head, cervical spine, and maxillofacial structures were performed using the standard protocol without intravenous contrast. Multiplanar CT image reconstructions of the cervical spine and maxillofacial structures were also generated.  COMPARISON:  CT on 02/14/2016 and cervical spine CT on 08/29/2015 FINDINGS: CT HEAD FINDINGS Brain: 7 x 7 mm intraparenchymal hemorrhage seen in the right frontal lobe. Mild subarachnoid hemorrhage is seen in the right frontal and temporal regions, and mild intraventricular hemorrhage is seen in the occipital horns bilaterally. Ventricles remain stable in size. No evidence of acute cerebral infarct, edema, mass effect, or midline shift. Mild to moderate diffuse cerebral atrophy again noted. Vascular: No hyperdense vessel or unexpected calcification. Skull: Normal. Negative for fracture or focal lesion. Other: None. CT MAXILLOFACIAL FINDINGS Osseous: Nondisplaced fracture is seen involving the lateral wall of the right maxillary sinus. No other are facial bone fractures are identified. Orbits: No evidence of fracture. Globes and other intraorbital anatomy are unremarkable. Sinuses: Right maxillary sinus air-fluid level. Soft tissues: Right frontal, maxillary, and preseptal soft tissue swelling is seen. CT CERVICAL SPINE FINDINGS Alignment: Normal. Skull base and vertebrae: No acute fracture. No primary bone lesion or focal pathologic process. Soft tissues and spinal canal: No prevertebral fluid or swelling. No visible canal hematoma. 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 Mild to moderate degenerative disc disease seen at C5-6. Mild to moderate facet DJD present bilaterally at C4-5 and C5-6. Moderate atlantoaxial degenerative changes also seen. Upper chest: Negative. Other: None. IMPRESSION: 7 x 7 mm intraparenchymal hemorrhage in right frontal lobe. No evidence of mass effect or midline shift. Mild right frontal and temporal subarachnoid hemorrhage. Mild intraventricular hemorrhage in both occipital horns. No evidence of acute hydrocephalus. Nondisplaced fracture of the lateral wall of the right maxillary sinus. No evidence of acute cervical spine fracture or subluxation. Degenerative  spondylosis and congenital fusion, as described above. Critical Value/emergent results were called by telephone at the time of interpretation on 08/20/2016 at 8:41 pm to Dr. Trixie Dredge , who verbally acknowledged these results. Electronically Signed   By: Myles Rosenthal M.D.   On: 08/20/2016 20:43   Ct Cervical Spine Wo Contrast  Result Date: 08/20/2016 CLINICAL DATA:  Fall. Head, facial, and neck pain and swelling. Initial encounter. EXAM: CT HEAD WITHOUT CONTRAST CT MAXILLOFACIAL WITHOUT CONTRAST CT CERVICAL SPINE WITHOUT CONTRAST TECHNIQUE: Multidetector CT imaging of the head, cervical spine, and maxillofacial structures were performed using the standard protocol without intravenous contrast. Multiplanar CT image reconstructions of the cervical spine and maxillofacial structures were also generated. COMPARISON:  CT on 02/14/2016 and cervical spine CT on 08/29/2015 FINDINGS: CT HEAD FINDINGS Brain: 7 x 7 mm intraparenchymal hemorrhage seen in the right frontal lobe. Mild subarachnoid hemorrhage is seen in the right frontal and temporal regions, and mild intraventricular hemorrhage is seen in the occipital horns bilaterally. Ventricles remain stable in size. No evidence of acute cerebral infarct, edema, mass effect, or midline shift. Mild to moderate diffuse cerebral atrophy again noted. Vascular: No hyperdense vessel or unexpected calcification. Skull: Normal. Negative for fracture or focal lesion. Other: None. CT MAXILLOFACIAL FINDINGS Osseous: Nondisplaced fracture is seen involving the lateral wall of the right maxillary sinus. No other are facial bone fractures are identified. Orbits: No evidence of fracture. Globes and other intraorbital anatomy are unremarkable. Sinuses: Right maxillary sinus air-fluid level. Soft tissues: Right frontal, maxillary, and preseptal soft tissue swelling is  seen. CT CERVICAL SPINE FINDINGS Alignment: Normal. Skull base and vertebrae: No acute fracture. No primary bone lesion or  focal pathologic process. Soft tissues and spinal canal: No prevertebral fluid or swelling. No visible canal hematoma. 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 Mild to moderate degenerative disc disease seen at C5-6. Mild to moderate facet DJD present bilaterally at C4-5 and C5-6. Moderate atlantoaxial degenerative changes also seen. Upper chest: Negative. Other: None. IMPRESSION: 7 x 7 mm intraparenchymal hemorrhage in right frontal lobe. No evidence of mass effect or midline shift. Mild right frontal and temporal subarachnoid hemorrhage. Mild intraventricular hemorrhage in both occipital horns. No evidence of acute hydrocephalus. Nondisplaced fracture of the lateral wall of the right maxillary sinus. No evidence of acute cervical spine fracture or subluxation. Degenerative spondylosis and congenital fusion, as described above. Critical Value/emergent results were called by telephone at the time of interpretation on 08/20/2016 at 8:41 pm to Dr. Trixie Dredge , who verbally acknowledged these results. Electronically Signed   By: Myles Rosenthal M.D.   On: 08/20/2016 20:43   Dg Chest Port 1 View  Result Date: 08/22/2016 CLINICAL DATA:  Decreased breath sounds. Patient unable to communicate any complaints. EXAM: PORTABLE CHEST 1 VIEW COMPARISON:  Portable chest x-ray of August 20, 2016 FINDINGS: The lungs remain mildly hypoinflated. The interstitial markings remain coarse. Patchy bibasilar opacities persist. The left hemidiaphragm is better demonstrated today. The cardiac silhouette remains enlarged. The pulmonary vascularity is mildly prominent centrally. The trachea is midline. There is calcification in the wall of the aortic arch. The bones are osteopenic. There are old rib deformities on the right. IMPRESSION: Bibasilar atelectasis or pneumonia more conspicuous than on the study of 2 days ago. Stable cardiomegaly without significant pulmonary vascular congestion. Thoracic aortic atherosclerosis.  Electronically Signed   By: [name]  [place] M.D.   On: 08/22/2016 13:10   Dg Chest Port 1 View  Result Date: 08/20/2016 CLINICAL DATA:  Fall.  Right hip fracture.  Pre-op respiratory exam EXAM: PORTABLE CHEST 1 VIEW COMPARISON:  08/31/2015 FINDINGS: Heart size remains normal. Aortic atherosclerosis. Low lung volumes again noted. Scarring again seen in left lung base. No evidence of pulmonary infiltrate or edema. No evidence of pneumothorax or pleural effusion. Several old right rib fracture deformities again noted. IMPRESSION: Stable left basilar scarring.  No active lung disease. Aortic atherosclerosis. Electronically Signed   By: Myles Rosenthal M.D.   On: 08/20/2016 20:12   Dg Hip Unilat W Or Wo Pelvis 2-3 Views Right  Result Date: 08/20/2016 CLINICAL DATA:  Fall.  Right hip pain.  Initial encounter. EXAM: DG HIP (WITH OR WITHOUT PELVIS) 2-3V RIGHT COMPARISON:  None. FINDINGS: Impacted subcapital right femoral neck fracture is seen. No evidence of dislocation. Generalized osteopenia. IMPRESSION: Impacted subcapital right femoral neck fracture. Electronically Signed   By: Myles Rosenthal M.D.   On: 08/20/2016 20:10   Ct Maxillofacial Wo Cm  Result Date: 08/20/2016 CLINICAL DATA:  Fall. Head, facial, and neck pain and swelling. Initial encounter. EXAM: CT HEAD WITHOUT CONTRAST CT MAXILLOFACIAL WITHOUT CONTRAST CT CERVICAL SPINE WITHOUT CONTRAST TECHNIQUE: Multidetector CT imaging of the head, cervical spine, and maxillofacial structures were performed using the standard protocol without intravenous contrast. Multiplanar CT image reconstructions of the cervical spine and maxillofacial structures were also generated. COMPARISON:  CT on 02/14/2016 and cervical spine CT on 08/29/2015 FINDINGS: CT HEAD FINDINGS Brain: 7 x 7 mm intraparenchymal hemorrhage seen in the right frontal lobe. Mild subarachnoid hemorrhage is seen  in the right frontal and temporal regions, and mild intraventricular hemorrhage is seen in the  occipital horns bilaterally. Ventricles remain stable in size. No evidence of acute cerebral infarct, edema, mass effect, or midline shift. Mild to moderate diffuse cerebral atrophy again noted. Vascular: No hyperdense vessel or unexpected calcification. Skull: Normal. Negative for fracture or focal lesion. Other: None. CT MAXILLOFACIAL FINDINGS Osseous: Nondisplaced fracture is seen involving the lateral wall of the right maxillary sinus. No other are facial bone fractures are identified. Orbits: No evidence of fracture. Globes and other intraorbital anatomy are unremarkable. Sinuses: Right maxillary sinus air-fluid level. Soft tissues: Right frontal, maxillary, and preseptal soft tissue swelling is seen. CT CERVICAL SPINE FINDINGS Alignment: Normal. Skull base and vertebrae: No acute fracture. No primary bone lesion or focal pathologic process. Soft tissues and spinal canal: No prevertebral fluid or swelling. No visible canal hematoma. 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 Mild to moderate degenerative disc disease seen at C5-6. Mild to moderate facet DJD present bilaterally at C4-5 and C5-6. Moderate atlantoaxial degenerative changes also seen. Upper chest: Negative. Other: None. IMPRESSION: 7 x 7 mm intraparenchymal hemorrhage in right frontal lobe. No evidence of mass effect or midline shift. Mild right frontal and temporal subarachnoid hemorrhage. Mild intraventricular hemorrhage in both occipital horns. No evidence of acute hydrocephalus. Nondisplaced fracture of the lateral wall of the right maxillary sinus. No evidence of acute cervical spine fracture or subluxation. Degenerative spondylosis and congenital fusion, as described above. Critical Value/emergent results were called by telephone at the time of interpretation on 08/20/2016 at 8:41 pm to Dr. Trixie Dredge , who verbally acknowledged these results. Electronically Signed   By: Myles Rosenthal M.D.   On: 08/20/2016 20:43     Assessment/Plan: CT of brain without contrast essentially stable and without significant change. Neurologically a bit more responsive today than yesterday. Unfortunately the patient's family was here earlier, but is not present currently, however I did have an opportunity discuss my assessment and interpretation of the CT with his nurse Asher Muir, who had actually spoken with the family earlier today, explaining that the CT findings were essentially stable.  No further recommendation for neurosurgical perspective, no indication for neurosurgical intervention.   Hewitt Shorts, MD 08/22/2016, 6:36 PM

## 2016-08-22 NOTE — Progress Notes (Signed)
SLP Cancellation Note  Patient Details Name: Michael [name] MRN: [id number] DOB: 10/30/1927   Cancelled treatment:       Reason Eval/Treat Not Completed: Patient's level of consciousness.  Harlon Ditty, MA CCC-SLP [phone]    Michael [name] 08/22/2016, 10:13 AM

## 2016-08-22 NOTE — Progress Notes (Signed)
Nutrition Brief Note  Chart reviewed. Pt now transitioning to comfort care.  No further nutrition interventions warranted at this time.  Please re-consult as needed.   Ashon Rosenberg A. Yatzary Merriweather, RD, LDN, CDE Pager: [phone] After hours Pager: [phone]  

## 2016-08-22 NOTE — NC FL2 (Signed)
[place] MEDICAID FL2 LEVEL OF CARE SCREENING TOOL     IDENTIFICATION  Patient Name: Michael [name] Birthdate: March 16, 1928 Sex: male Admission Date (Current Location): 08/20/2016  [facility] and [place] Number:  Producer, television/film/video and Address:  [facility], [address] [place], [place] [address]      Provider Number: [id number]  Attending Physician Name and Address:  Narda Bonds, MD;Timot*  Relative Name and Phone Number:       Current Level of Care: Hospital Recommended Level of Care: Skilled Nursing Facility Prior Approval Number:    Date Approved/Denied:   PASRR Number: [id number]  Discharge Plan: SNF    Current Diagnoses: Patient Active Problem List   Diagnosis Date Noted  . Closed fracture of neck of right femur (HCC)   . Intraventricular hemorrhage (HCC)   . Facial laceration 08/20/2016  . SAH (subarachnoid hemorrhage) (HCC) 08/20/2016  . Intraparenchymal hemorrhage of brain (HCC) 08/20/2016  . Closed right hip fracture, initial encounter (HCC) 08/20/2016  . Moderate protein-calorie malnutrition (HCC) 04/26/2016  . 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 BPH (benign prostatic hyperplasia) 12/31/2015  . GERD (gastroesophageal reflux disease) 12/31/2015  . Vitamin D deficiency 10/28/2015  . Facial droop   . Encounter for palliative care   . Goals of care, counseling/discussion   . Dysphagia 09/01/2015  . Dementia 09/01/2015  . Metabolic encephalopathy 08/30/2015  . Fall 08/30/2015  . Hyperammonemia (HCC) 08/30/2015  . Parkinson's disease (HCC) 03/27/2013  . Other B-complex deficiencies 09/16/2012    Orientation RESPIRATION BLADDER Height & Weight     Self  Normal Incontinent, External catheter (external catheter) Weight: 132 lb 12.8 oz (60.2 kg) Height:   (157.5 cm)  BEHAVIORAL SYMPTOMS/MOOD NEUROLOGICAL BOWEL NUTRITION STATUS      Continent Diet (see d/c summary)  AMBULATORY STATUS COMMUNICATION OF NEEDS Skin    Total Care Verbally Other (Comment), Skin abrasions, Bruising (laceration to face)                       Personal Care Assistance Level of Assistance  Bathing, Feeding, Dressing Bathing Assistance: Maximum assistance Feeding assistance: Maximum assistance Dressing Assistance: Maximum assistance     Functional Limitations Info  Sight, Hearing, Speech Sight Info: Adequate Hearing Info: Adequate Speech Info: Adequate    SPECIAL CARE FACTORS FREQUENCY                       Contractures Contractures Info: Not present    Additional Factors Info  Code Status, Allergies Code Status Info: DNR Allergies Info: NKA           Current Medications (08/22/2016):  This is the current hospital active medication list Current Facility-Administered Medications  Medication Dose Route Frequency Provider Last Rate Last Dose  . acetaminophen (TYLENOL) tablet 500 mg  500 mg Oral Q6H PRN Lavone Neri Opyd, MD      . carbidopa-levodopa (SINEMET IR) 25-100 MG per tablet immediate release 1.5 tablet  1.5 tablet Oral TID Briscoe Deutscher, MD      . cholecalciferol (VITAMIN D) tablet 1,000 Units  1,000 Units Oral Daily Lavone Neri Opyd, MD      . donepezil (ARICEPT) tablet 10 mg  10 mg Oral QHS Timothy S Opyd, MD      . hydrALAZINE (APRESOLINE) injection 5 mg  5 mg Intravenous Q4H PRN Briscoe Deutscher, MD      . HYDROcodone-acetaminophen (NORCO/VICODIN) 5-325 MG per tablet  1-2 tablet  1-2 tablet Oral Q4H PRN Lavone Neri Opyd, MD      . morphine 2 MG/ML injection 0.5 mg  0.5 mg Intravenous Q2H PRN Briscoe Deutscher, MD   0.5 mg at 08/21/16 1422  . multivitamin with minerals tablet 1 tablet  1 tablet Oral Daily Lavone Neri Opyd, MD      . omega-3 acid ethyl esters (LOVAZA) capsule 1,000 mg  1,000 mg Oral Daily Timothy S Opyd, MD      . pantoprazole (PROTONIX) EC tablet 40 mg  40 mg Oral Daily Timothy S Opyd, MD      . sodium chloride flush (NS) 0.9 % injection 3 mL  3 mL Intravenous Q12H Briscoe Deutscher, MD    3 mL at 08/21/16 2200     Discharge Medications: Please see discharge summary for a list of discharge medications.  Relevant Imaging Results:  Relevant Lab Results:   Additional Information SSN: [id number]  Nelwyn Salisbury, LCSW

## 2016-08-22 NOTE — Progress Notes (Signed)
No charge note:  Palliative consult received.   Family meeting planned for tomorrow 4/11 at 0930.  Ocie Bob, AGNP-C Palliative Medicine  Please call Palliative Medicine team phone with any questions [phone]. For individual providers please see AMION.

## 2016-08-22 NOTE — Care Management Note (Signed)
Case Management Note  Patient Details  Name: Michael [name] MRN: [id number] Date of Birth: 1927-05-18  Subjective/Objective:                 Admitted from [facility]. CSW following.    Action/Plan:   Expected Discharge Date:  08/23/16               Expected Discharge Plan:  Skilled Nursing Facility  In-House Referral:  Clinical Social Work  Discharge planning Services  CM Consult  Post Acute Care Choice:    Choice offered to:     DME Arranged:    DME Agency:     HH Arranged:    HH Agency:     Status of Service:  In process, will continue to follow  If discussed at Long Length of Stay Meetings, dates discussed:    Additional Comments:  Lawerance Sabal, RN 08/22/2016, 1:54 PM

## 2016-08-23 DIAGNOSIS — Z515 Encounter for palliative care: Secondary | ICD-10-CM

## 2016-08-23 DIAGNOSIS — J690 Pneumonitis due to inhalation of food and vomit: Secondary | ICD-10-CM | POA: Diagnosis present

## 2016-08-23 DIAGNOSIS — Z7189 Other specified counseling: Secondary | ICD-10-CM

## 2016-08-23 DIAGNOSIS — S72001A Fracture of unspecified part of neck of right femur, initial encounter for closed fracture: Secondary | ICD-10-CM

## 2016-08-23 DIAGNOSIS — S0240CA Maxillary fracture, right side, initial encounter for closed fracture: Secondary | ICD-10-CM

## 2016-08-23 DIAGNOSIS — I619 Nontraumatic intracerebral hemorrhage, unspecified: Secondary | ICD-10-CM

## 2016-08-23 DIAGNOSIS — R0689 Other abnormalities of breathing: Secondary | ICD-10-CM

## 2016-08-23 LAB — GLUCOSE, CAPILLARY: GLUCOSE-CAPILLARY: 99 mg/dL (ref 65–99)

## 2016-08-23 MED ORDER — PIPERACILLIN-TAZOBACTAM 3.375 G IVPB
3.3750 g | Freq: Three times a day (TID) | INTRAVENOUS | Status: DC
  Administered 2016-08-23 – 2016-08-24 (×3): 3.375 g via INTRAVENOUS
  Filled 2016-08-23 (×5): qty 50

## 2016-08-23 NOTE — Progress Notes (Signed)
Pharmacy Antibiotic Note  70 yom admitted after traumatic fall at his SNF. Now with possible aspiration with decreased breath sounds on the left. New orders to start zosyn.   Tmax 100.9 overnight, wbc rising at 16.6.  Plan: Zosyn 3.375g IV q8h (4 hour infusion).  Will sign off and follow peripherally  Height:  (157.5 cm) Weight: 133 lb 1.6 oz (60.4 kg) IBW/kg (Calculated) : 54.6  Temp (24hrs), Avg:99.4 F (37.4 C), Min:97.4 F (36.3 C), Max:100.9 F (38.3 C)   Recent Labs Lab 08/20/16 2000 08/21/16 0320  WBC 12.5* 16.6*  CREATININE 0.86 0.99    Estimated Creatinine Clearance: 41.4 mL/min (by C-G formula based on SCr of 0.99 mg/dL).    No Known Allergies  Thank you for allowing pharmacy to be a part of this patient's care.  Sheppard Coil PharmD., BCPS Clinical Pharmacist Pager [phone] 08/23/2016 12:43 PM

## 2016-08-23 NOTE — Evaluation (Signed)
Clinical/Bedside Swallow Evaluation Patient Details  Name: Michael [name] MRN: [id number] Date of Birth: Feb 17, 1928  Today's Date: 08/23/2016 Time: 81 SLP Start Time (ACUTE ONLY): 1430 SLP Stop Time (ACUTE ONLY): 1503 SLP Time Calculation (min) (ACUTE ONLY): 33 min  Past Medical History:  Past Medical History:  Diagnosis Date  . Hallucinations   . 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 Other vitamin B12 deficiency anemia   . Paralysis agitans ([place])   . Parkinson's disease ([place]) 03/27/2013  . Unspecified transient cerebral ischemia    Past Surgical History:  Past Surgical History:  Procedure Laterality Date  . APPENDECTOMY    . CHOLECYSTECTOMY    . PTCA  1987  . TRANSURETHRAL RESECTION OF PROSTATE     HPI:  83 y.o.malewith medical history significant for Parkinson's disease, dementia, and GERD admitted to ED from SNF after fall, sustaining intraparenchymal/temporal subarachnoid hemorrhage, right maxillary fx, right femoral neck fracture.  Family has met with Palliative medicine, and they are electing a comfort approach.    Assessment / Plan / Recommendation Clinical Impression  Pt presents with exacerbation of chronic dysphagia.  He was drowsy, but could be aroused for participation.  Demonstrated adequate oral recognition, with active attempts to masticate purees/ice chips; adequate labial seal.  Consumption of small sips of water and limited tspns of pudding led to multiple subswallows, wet phonation, but no overt coughing.  Pt does have a hx of silent aspiration.  Pt is being followed by palliative medicine - meeting planned for tomorrow per Michael [name].  She is very concerned that 83 he may develop pna.  For today, recommend allowing small sips of water, limited tspns of puree after oral care and if pt is sufficiently alert/participatory.  SLP will f/u next date for [place].  Discussed precautions and likelihood of aspiration with Michael [name]; provided  encouragement.  SLP Visit Diagnosis: Dysphagia, unspecified (R13.10)    Aspiration Risk  Moderate aspiration risk    Diet Recommendation   after oral care, sips of water from spoon, tspns of puree  Medication Administration: Crushed with puree    Other  Recommendations Oral Care Recommendations: Oral care QID;Oral care prior to ice chip/H20   Follow up Recommendations  (tba)      Frequency and Duration min 2x/week  1 week       Prognosis Prognosis for Safe Diet Advancement: Fair      Swallow Study   General Date of Onset: 81/08/18 HPI: 83 y.o.malewith medical history significant for Parkinson's disease, dementia, and GERD admitted to ED from SNF after fall, sustaining intraparenchymal/temporal subarachnoid hemorrhage, right maxillary fx, right femoral neck fracture.  Family has met with Palliative medicine, and they are electing a comfort approach.  Type of Study: Bedside Swallow Evaluation Previous Swallow Assessment: MBS 4/17 -chronic dysphagia; risk of silent aspiration Diet Prior to this Study: NPO Temperature Spikes Noted: Yes Respiratory Status: Room air History of Recent Intubation: No Behavior/Cognition: Alert Oral Cavity Assessment: Within Functional Limits Oral Care Completed by SLP: Recent completion by staff Oral Cavity - Dentition: Missing dentition Vision: Impaired for self-feeding Self-Feeding Abilities: Total assist Patient Positioning: Upright in bed Baseline Vocal Quality: Hoarse;Low vocal intensity Volitional Cough: Cognitively unable to elicit Volitional Swallow: Unable to elicit    Oral/Motor/Sensory Function     Ice Chips Ice chips: Within functional limits Presentation: Spoon   Thin Liquid Thin Liquid: Impaired Presentation: Spoon;Cup Pharyngeal  Phase Impairments: Multiple swallows;Wet Vocal Quality    Nectar Thick Nectar  Thick Liquid: Not tested   Honey Thick Honey Thick Liquid: Not tested   Puree Puree: Impaired Presentation:  Spoon Pharyngeal Phase Impairments: Multiple swallows;Wet Vocal Quality   Solid   GO   Solid: Not tested        Juan Quam Laurice 08/23/2016,3:04 PM   Estill Bamberg L. Tivis Ringer, [place] CCC/SLP Pager [phone]

## 2016-08-23 NOTE — Consult Note (Signed)
Consultation Note Date: 08/23/2016   Patient Name: Michael [name]  DOB: Aug 16, 1927  MRN: [id number]  Age / Sex: 83 y.o., male  PCP: Provider Not In System Referring Physician: Mendel Corning, MD  Reason for Consultation: Establishing goals of care  HPI/Patient Profile: 83 y.o. male  with past medical history of Parkinson's, dementia, GERD,  admitted on 08/20/2016 after a fall at [facility] resulting in a R hip fracture, maxillary fracture, and subarachnoid and right frontal cerebral hemorrhage. Palliative medicine consulted for [place].    Clinical Assessment and Goals of Care: Met with patient's son, Shanon Brow, spouseInez Catalina; daughter in law, Granddaughter- Passenger transport manager; and [place] Granddaughter- [place]. RN- Lynelle Smoke was also present. They have been pleased with patient's care at [facility], however, Shanon Brow has some questions that he would like answered before he decides if patient is to return there. Their current GOC are for patient to return to his previous level of functioning, for him not to be in pain, and for him not to suffer. They would not want to artificially prolong his life. We discussed feeding tube (this was an option that had been presented to them yesterday by nursing) and how a feeding tube would likely not align with the goals of not artificially prolonging life and providing comfort. All agreed no feeding tube. Family expressed concern re: patient not receiving medications for Parkinson's and this affecting his mental status. Decision was made to attempt swallow eval for possible medication administration by mouth and comfort feeding. Noted that natural trajection of Parkinson's and dementia in general is dysphagia and the ceasing of eating, and drinking and taking medications may be part of the comfort and dying process. Discussed that Dr. Tana Coast started patient on IV antibiotics today for possible aspiration  pneumonia. Family would like to continue this course of antibiotics for now.  Family was receptive to Hospice services at [facility] when patient is discharged and proceeding with comfort care.   Primary Decision Maker NEXT OF KIN - patient's spouse- Inez Catalina- with assistance from Son    SUMMARY OF RECOMMENDATIONS -Continue current care -D/C to SNF with Hospice- comfort care -No feeding tube    Code Status/Advance Care Planning:  DNR   Palliative Prophylaxis:   Frequent Pain Assessment  Additional Recommendations (Limitations, Scope, Preferences):  Minimize Medications  Prognosis:    < 3 months d/t decreased functional status d/t hip fracture without repair, Parkinson's dementia, decreased po intake d/t dysphagia, cerebral hemorrhage w/o repair, decreased neuro status  Discharge Planning: Startup with Hospice  Primary Diagnoses: Present on Admission: . Parkinson's disease ([place]) . Dementia . Facial laceration . Intraparenchymal hemorrhage of brain ([place]) . Closed right hip fracture, initial encounter ([place])   I have reviewed the medical record, interviewed the patient and family, and examined the patient. The following aspects are pertinent.  Past Medical History:  Diagnosis Date  . Hallucinations   . 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 Other vitamin B12 deficiency anemia   .  Paralysis agitans ([place])   . Parkinson's disease ([place]) 03/27/2013  . Unspecified transient cerebral ischemia    Social History   Social History  . Marital status: Married    Spouse name: N/A  . Number of children: N/A  . Years of education: N/A   Social History Main Topics  . Smoking status: Never Smoker  . Smokeless tobacco: Never Used  . Alcohol use No  . Drug use: No  . Sexual activity: Not Asked   Other Topics Concern  . None   Social History Narrative  . None   Family History  Problem Relation Age of Onset  .  Parkinson's disease Sister   . Cancer Brother   . Parkinson's disease Daughter 74   Scheduled Meds: . carbidopa-levodopa  1.5 tablet Oral TID  . cholecalciferol  1,000 Units Oral Daily  . donepezil  10 mg Oral QHS  . multivitamin with minerals  1 tablet Oral Daily  . omega-3 acid ethyl esters  1,000 mg Oral Daily  . pantoprazole  40 mg Oral Daily  . piperacillin-tazobactam (ZOSYN)  IV  3.375 g Intravenous Q8H  . sodium chloride flush  3 mL Intravenous Q12H   Continuous Infusions: . dextrose 5 % and 0.45% NaCl 75 mL/hr at 08/23/16 0259   PRN Meds:.acetaminophen, hydrALAZINE, HYDROcodone-acetaminophen, morphine injection Medications Prior to Admission:  Prior to Admission medications   Medication Sig Start Date End Date Taking? Authorizing Provider  acetaminophen (TYLENOL) 500 MG tablet Take 500 mg by mouth every 6 (six) hours as needed for moderate pain.    Yes Historical Provider, MD  AMBULATORY NON FORMULARY MEDICATION Magic Cup Sig: One cup by mouth every day with lunch meal for weight loss   Yes Historical Provider, MD  aspirin 325 MG tablet Take 325 mg by mouth daily.    Yes Historical Provider, MD  carbidopa-levodopa (SINEMET IR) 25-100 MG tablet 1.5 tablets by mouth three times daily. Dosing times : 1 hour before meals or 2 hours after meals.    Yes Historical Provider, MD  Cholecalciferol (VITAMIN D-3) 1000 UNITS CAPS Take 1 capsule by mouth daily.    Yes Historical Provider, MD  donepezil (ARICEPT) 10 MG tablet Take 1 tablet by mouth at  bedtime 07/15/15  Yes Star Age, MD  fish oil-omega-3 fatty acids 1000 MG capsule Take 1,000 mg by mouth daily.    Yes Historical Provider, MD  ketoconazole (NIZORAL) 2 % shampoo Apply 1 application topically 2 (two) times a week. Wed/saturday 3-11 shift until resolved   Yes Historical Provider, MD  MALTODEXTRIN-XANTHAN GUM PO Give 1gm by mouth as needed due to high risk for aspiration   Yes Historical Provider, MD  Multiple Vitamin  (MULTIVITAMIN WITH MINERALS) TABS tablet Take 1 tablet by mouth daily.   Yes Historical Provider, MD  multivitamin-lutein (OCUVITE-LUTEIN) CAPS capsule Take 1 capsule by mouth daily.   Yes Historical Provider, MD  omeprazole (PRILOSEC) 20 MG capsule Take 20 mg by mouth daily.    Yes Historical Provider, MD   No Known Allergies Review of Systems  Physical Exam  Vital Signs: BP 109/67 (BP Location: Left Arm)   Pulse 65   Temp 100.1 F (37.8 C) (Oral)   Resp (!) 23   Ht '5\' 2"'$  (1.575 m)   Wt 60.4 kg (133 lb 1.6 oz)   SpO2 95%   BMI 24.34 kg/m  Pain Assessment: Faces       SpO2: SpO2: 95 % O2 Device:SpO2: 95 % O2 Flow Rate: .  IO: Intake/output summary:  Intake/Output Summary (Last 24 hours) at 08/23/16 1059 Last data filed at 08/23/16 0400  Gross per 24 hour  Intake          1073.75 ml  Output              650 ml  Net           423.75 ml    LBM:   Baseline Weight: Weight: 64.4 kg (142 lb) Most recent weight: Weight: 60.4 kg (133 lb 1.6 oz)     Palliative Assessment/Data: PPS: 10%     Thank you for this consult. Palliative medicine will continue to follow and assist as needed.   Time In:1000 Time Out: 1130 Time Total: 90 minutes Greater than 50%  of this time was spent counseling and coordinating care related to the above assessment and plan.  Signed by: Mariana Kaufman, AGNP-C Palliative Medicine    Please contact Palliative Medicine Team phone at [phone] for questions and concerns.  For individual provider: See Shea Evans

## 2016-08-23 NOTE — Progress Notes (Signed)
Family meeting w/Palliative today. Family chooses to comfort feed patient at this time w/expressed understanding of risk for aspiration.  Await SLP eval for comfort feeding and recommendations later today.  Family most interested in getting his parkinson's meds in him as they feel this will help him show improvements in mobility and alertness.  Family meeting to be held again tomorrow morning at 10AM w/Palliative to further discuss patient status and discharge disposition.

## 2016-08-23 NOTE — Progress Notes (Signed)
[facility]                                                                              Patient Demographics  Michael [name], is a 83 y.o. male, DOB - 03/06/1928, ZOX:[id number]  Admit date - 08/20/2016   Admitting Physician Briscoe Deutscher, MD  Outpatient Primary MD for the patient is PROVIDER NOT IN SYSTEM  Outpatient specialists:   LOS - 3  days    Chief Complaint  Patient presents with  . Fall  . Head Laceration       Brief summary   Michael [name] is a 83 y.o. male with a medical history of Parkinson disease, dementia and GERD. He presented after having a traumatic fall at his SNF. He does not recall the event. He was found to have an intraparenchymal/temporal subarachnoid hemorrhage, nondisplaced maxillary fracture.    Assessment & Plan    Principal Problem:  Intraparenchymal hemorrhage Subarachnoid hemorrhage Secondary to traumatic fall with injury to his head.   - Follow up CT head showed no change and minimal traumatic SAH and minimal increased size in small right frontal cerebral hemorrhage, no mass effect. Significant generalized atrophy  - No surgery, neurosurgery following -palliative medicine consulted  Closed right hip fracture - Secondary to fall. Orthopedic surgery discussed with family. Decision for no surgery  Parkinson disease Dementia Patient close to baseline currently per son and granddaughter -Continue Sinemet and Aricept  Right maxillary fracture Right facial laceration Non-displaced fracture. Laceration repaired in ED -analgesics prn    Aspiration pneumonia (HCC) -Patient spiking fevers, leukocytosis, chest x-ray showed by basilar atelectasis or pneumonia more conspicuity than on the study 2 days ago. Lung exam is rhonchorous - Placed on IV Zosyn  Code Status: DO NOT RESUSCITATE DVT Prophylaxis:  SCD's Family Communication: Discussed in detail with the patient, all imaging results, lab results explained to the  patient, Wife, granddaughter, son and multiple other family members in the room    Disposition Plan: Skilled nursing facility  Time Spent in minutes   25 minutes  Consultants:   Neurosurgery, Dr. Newell Coral  Orthopedic surgery, Dr. Idolina Primer  Palliative medicine  Procedures:   Laceration repair  Antimicrobials:  IV Zosyn 4/11 >>  Medications  Scheduled Meds: . carbidopa-levodopa  1.5 tablet Oral TID  . cholecalciferol  1,000 Units Oral Daily  . donepezil  10 mg Oral QHS  . multivitamin with minerals  1 tablet Oral Daily  . omega-3 acid ethyl esters  1,000 mg Oral Daily  . pantoprazole  40 mg Oral Daily  . piperacillin-tazobactam (ZOSYN)  IV  3.375 g Intravenous Q8H  . sodium chloride flush  3 mL Intravenous Q12H   Continuous Infusions: . dextrose 5 % and 0.45% NaCl 75 mL/hr at 08/23/16 0259   PRN Meds:.acetaminophen, hydrALAZINE, HYDROcodone-acetaminophen, morphine injection   Antibiotics   Anti-infectives    Start     Dose/Rate Route Frequency Ordered Stop   08/23/16 1200  piperacillin-tazobactam (ZOSYN) IVPB 3.375 g     3.375 g 12.5 mL/hr over 240 Minutes Intravenous Every 8 hours 08/23/16 1030  Subjective:   Michael [name] was seen and examined today.  Patient only responds to his name otherwise unable to provide any review of systems. He follows some commands, able to wiggle his toes and squeeze my fingers. Wife at the bedside and multiple family members in the room. Per family members, close to his baseline mental status, more alert than yesterday. Spiking fevers, 101.69F.    Objective:   Vitals:   08/23/16 0400 08/23/16 0500 08/23/16 0758 08/23/16 1115  BP: (!) 143/59  109/67 101/62  Pulse: 65   72  Resp: (!) 23   (!) 21  Temp: 98.7 F (37.1 C)  100.1 F (37.8 C) (!) 101.4 F (38.6 C)  TempSrc: Oral  Oral Oral  SpO2: 94%  95% 96%  Weight:  60.4 kg (133 lb 1.6 oz)    Height:        Intake/Output Summary (Last 24 hours) at 08/23/16  1151 Last data filed at 08/23/16 0400  Gross per 24 hour  Intake          1073.75 ml  Output              650 ml  Net           423.75 ml     Wt Readings from Last 3 Encounters:  08/23/16 60.4 kg (133 lb 1.6 oz)  07/28/16 64 kg (141 lb)  07/04/16 63.5 kg (139 lb 14.4 oz)     Exam  General: Alert and oriented xSelf, keeps his eyes closed  HEENT:    Neck: Supple, no JVD  Cardiovascular: S1 S2 auscultated, no rubs, murmurs or gallops. Regular rate and rhythm.  Respiratory: Coarse breath sounds bilaterally  Gastrointestinal: Soft, nontender, nondistended, + bowel sounds  Ext: no cyanosis clubbing or edema  Neuro: able to squeeze my fingers and wiggle his toes  Skin: No rashes  Psych: alert, close to his baseline mental status per family   Data Reviewed:  I have personally reviewed following labs and imaging studies  Micro Results Recent Results (from the past 240 hour(s))  MRSA PCR Screening     Status: None   Collection Time: 08/21/16  6:46 PM  Result Value Ref Range Status   MRSA by PCR NEGATIVE NEGATIVE Final    Comment:        The GeneXpert MRSA Assay (FDA approved for NASAL specimens only), is one component of a comprehensive MRSA colonization surveillance program. It is not intended to diagnose MRSA infection nor to guide or monitor treatment for MRSA infections.     Radiology Reports Ct Head Wo Contrast  Result Date: 08/22/2016 CLINICAL DATA:  Intracranial hemorrhage EXAM: CT HEAD WITHOUT CONTRAST TECHNIQUE: Contiguous axial images were obtained from the base of the skull through the vertex without intravenous contrast. COMPARISON:  Head CT 08/20/2016 FINDINGS: Brain: Slight increase in size of right frontal intraparenchymal hematoma, now measuring 10 x 8 mm, previously 7 x 7 mm. There is subarachnoid blood again seen layering along the right anterolateral convexity. The extra-axial CSF space on the right has increased in size. The size and  configuration of the ventricles are unchanged. Small amount of blood is present within both occipital horns. Small amount of subarachnoid blood of along the right parietal lobe appears new from the prior study, possibly owing to redistribution. There is no new intraparenchymal hematoma. No midline shift or herniation. Vascular: No hyperdense vessel or unexpected calcification. Skull: Nondisplaced lateral right maxillary sinus wall fracture, unchanged. Sinuses/Orbits: The visualized portions  of the paranasal sinuses and mastoid air cells are free of fluid. No advanced mucosal thickening. The visualized orbits are normal. Other: None IMPRESSION: 1. Minimally increased size of right frontal intraparenchymal hematoma, possibly exaggerated by differences in slice selection. No new intraparenchymal blood. 2. Unchanged right frontal convexity subarachnoid blood. New right parietal subarachnoid blood products may be secondary to redistribution. 3. Unchanged layering blood in the occipital horns without hydrocephalus. 4. No midline shift or herniation. 5. Increased size of the right hemispheric extra-axial CSF space. This may indicate the presence of subdural hygroma. Electronically Signed   By: Deatra Robinson M.D.   On: 08/22/2016 07:10   Ct Head Wo Contrast  Result Date: 08/20/2016 CLINICAL DATA:  Fall. Head, facial, and neck pain and swelling. Initial encounter. EXAM: CT HEAD WITHOUT CONTRAST CT MAXILLOFACIAL WITHOUT CONTRAST CT CERVICAL SPINE WITHOUT CONTRAST TECHNIQUE: Multidetector CT imaging of the head, cervical spine, and maxillofacial structures were performed using the standard protocol without intravenous contrast. Multiplanar CT image reconstructions of the cervical spine and maxillofacial structures were also generated. COMPARISON:  CT on 02/14/2016 and cervical spine CT on 08/29/2015 FINDINGS: CT HEAD FINDINGS Brain: 7 x 7 mm intraparenchymal hemorrhage seen in the right frontal lobe. Mild subarachnoid  hemorrhage is seen in the right frontal and temporal regions, and mild intraventricular hemorrhage is seen in the occipital horns bilaterally. Ventricles remain stable in size. No evidence of acute cerebral infarct, edema, mass effect, or midline shift. Mild to moderate diffuse cerebral atrophy again noted. Vascular: No hyperdense vessel or unexpected calcification. Skull: Normal. Negative for fracture or focal lesion. Other: None. CT MAXILLOFACIAL FINDINGS Osseous: Nondisplaced fracture is seen involving the lateral wall of the right maxillary sinus. No other are facial bone fractures are identified. Orbits: No evidence of fracture. Globes and other intraorbital anatomy are unremarkable. Sinuses: Right maxillary sinus air-fluid level. Soft tissues: Right frontal, maxillary, and preseptal soft tissue swelling is seen. CT CERVICAL SPINE FINDINGS Alignment: Normal. Skull base and vertebrae: No acute fracture. No primary bone lesion or focal pathologic process. Soft tissues and spinal canal: No prevertebral fluid or swelling. No visible canal hematoma. 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 Mild to moderate degenerative disc disease seen at C5-6. Mild to moderate facet DJD present bilaterally at C4-5 and C5-6. Moderate atlantoaxial degenerative changes also seen. Upper chest: Negative. Other: None. IMPRESSION: 7 x 7 mm intraparenchymal hemorrhage in right frontal lobe. No evidence of mass effect or midline shift. Mild right frontal and temporal subarachnoid hemorrhage. Mild intraventricular hemorrhage in both occipital horns. No evidence of acute hydrocephalus. Nondisplaced fracture of the lateral wall of the right maxillary sinus. No evidence of acute cervical spine fracture or subluxation. Degenerative spondylosis and congenital fusion, as described above. Critical Value/emergent results were called by telephone at the time of interpretation on 08/20/2016 at 8:41 pm to Dr. Trixie Dredge , who  verbally acknowledged these results. Electronically Signed   By: Myles Rosenthal M.D.   On: 08/20/2016 20:43   Ct Cervical Spine Wo Contrast  Result Date: 08/20/2016 CLINICAL DATA:  Fall. Head, facial, and neck pain and swelling. Initial encounter. EXAM: CT HEAD WITHOUT CONTRAST CT MAXILLOFACIAL WITHOUT CONTRAST CT CERVICAL SPINE WITHOUT CONTRAST TECHNIQUE: Multidetector CT imaging of the head, cervical spine, and maxillofacial structures were performed using the standard protocol without intravenous contrast. Multiplanar CT image reconstructions of the cervical spine and maxillofacial structures were also generated. COMPARISON:  CT on 02/14/2016 and cervical spine CT on 08/29/2015 FINDINGS:  CT HEAD FINDINGS Brain: 7 x 7 mm intraparenchymal hemorrhage seen in the right frontal lobe. Mild subarachnoid hemorrhage is seen in the right frontal and temporal regions, and mild intraventricular hemorrhage is seen in the occipital horns bilaterally. Ventricles remain stable in size. No evidence of acute cerebral infarct, edema, mass effect, or midline shift. Mild to moderate diffuse cerebral atrophy again noted. Vascular: No hyperdense vessel or unexpected calcification. Skull: Normal. Negative for fracture or focal lesion. Other: None. CT MAXILLOFACIAL FINDINGS Osseous: Nondisplaced fracture is seen involving the lateral wall of the right maxillary sinus. No other are facial bone fractures are identified. Orbits: No evidence of fracture. Globes and other intraorbital anatomy are unremarkable. Sinuses: Right maxillary sinus air-fluid level. Soft tissues: Right frontal, maxillary, and preseptal soft tissue swelling is seen. CT CERVICAL SPINE FINDINGS Alignment: Normal. Skull base and vertebrae: No acute fracture. No primary bone lesion or focal pathologic process. Soft tissues and spinal canal: No prevertebral fluid or swelling. No visible canal hematoma. 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 Mild to  moderate degenerative disc disease seen at C5-6. Mild to moderate facet DJD present bilaterally at C4-5 and C5-6. Moderate atlantoaxial degenerative changes also seen. Upper chest: Negative. Other: None. IMPRESSION: 7 x 7 mm intraparenchymal hemorrhage in right frontal lobe. No evidence of mass effect or midline shift. Mild right frontal and temporal subarachnoid hemorrhage. Mild intraventricular hemorrhage in both occipital horns. No evidence of acute hydrocephalus. Nondisplaced fracture of the lateral wall of the right maxillary sinus. No evidence of acute cervical spine fracture or subluxation. Degenerative spondylosis and congenital fusion, as described above. Critical Value/emergent results were called by telephone at the time of interpretation on 08/20/2016 at 8:41 pm to Dr. Trixie Dredge , who verbally acknowledged these results. Electronically Signed   By: Myles Rosenthal M.D.   On: 08/20/2016 20:43   Dg Chest Port 1 View  Result Date: 08/22/2016 CLINICAL DATA:  Decreased breath sounds. Patient unable to communicate any complaints. EXAM: PORTABLE CHEST 1 VIEW COMPARISON:  Portable chest x-ray of August 20, 2016 FINDINGS: The lungs remain mildly hypoinflated. The interstitial markings remain coarse. Patchy bibasilar opacities persist. The left hemidiaphragm is better demonstrated today. The cardiac silhouette remains enlarged. The pulmonary vascularity is mildly prominent centrally. The trachea is midline. There is calcification in the wall of the aortic arch. The bones are osteopenic. There are old rib deformities on the right. IMPRESSION: Bibasilar atelectasis or pneumonia more conspicuous than on the study of 2 days ago. Stable cardiomegaly without significant pulmonary vascular congestion. Thoracic aortic atherosclerosis. Electronically Signed   By: [name]  [place] M.D.   On: 08/22/2016 13:10   Dg Chest Port 1 View  Result Date: 08/20/2016 CLINICAL DATA:  Fall.  Right hip fracture.  Pre-op respiratory exam  EXAM: PORTABLE CHEST 1 VIEW COMPARISON:  08/31/2015 FINDINGS: Heart size remains normal. Aortic atherosclerosis. Low lung volumes again noted. Scarring again seen in left lung base. No evidence of pulmonary infiltrate or edema. No evidence of pneumothorax or pleural effusion. Several old right rib fracture deformities again noted. IMPRESSION: Stable left basilar scarring.  No active lung disease. Aortic atherosclerosis. Electronically Signed   By: Myles Rosenthal M.D.   On: 08/20/2016 20:12   Dg Hip Unilat W Or Wo Pelvis 2-3 Views Right  Result Date: 08/20/2016 CLINICAL DATA:  Fall.  Right hip pain.  Initial encounter. EXAM: DG HIP (WITH OR WITHOUT PELVIS) 2-3V RIGHT COMPARISON:  None. FINDINGS: Impacted subcapital right femoral neck  fracture is seen. No evidence of dislocation. Generalized osteopenia. IMPRESSION: Impacted subcapital right femoral neck fracture. Electronically Signed   By: Myles Rosenthal M.D.   On: 08/20/2016 20:10   Ct Maxillofacial Wo Cm  Result Date: 08/20/2016 CLINICAL DATA:  Fall. Head, facial, and neck pain and swelling. Initial encounter. EXAM: CT HEAD WITHOUT CONTRAST CT MAXILLOFACIAL WITHOUT CONTRAST CT CERVICAL SPINE WITHOUT CONTRAST TECHNIQUE: Multidetector CT imaging of the head, cervical spine, and maxillofacial structures were performed using the standard protocol without intravenous contrast. Multiplanar CT image reconstructions of the cervical spine and maxillofacial structures were also generated. COMPARISON:  CT on 02/14/2016 and cervical spine CT on 08/29/2015 FINDINGS: CT HEAD FINDINGS Brain: 7 x 7 mm intraparenchymal hemorrhage seen in the right frontal lobe. Mild subarachnoid hemorrhage is seen in the right frontal and temporal regions, and mild intraventricular hemorrhage is seen in the occipital horns bilaterally. Ventricles remain stable in size. No evidence of acute cerebral infarct, edema, mass effect, or midline shift. Mild to moderate diffuse cerebral atrophy again noted.  Vascular: No hyperdense vessel or unexpected calcification. Skull: Normal. Negative for fracture or focal lesion. Other: None. CT MAXILLOFACIAL FINDINGS Osseous: Nondisplaced fracture is seen involving the lateral wall of the right maxillary sinus. No other are facial bone fractures are identified. Orbits: No evidence of fracture. Globes and other intraorbital anatomy are unremarkable. Sinuses: Right maxillary sinus air-fluid level. Soft tissues: Right frontal, maxillary, and preseptal soft tissue swelling is seen. CT CERVICAL SPINE FINDINGS Alignment: Normal. Skull base and vertebrae: No acute fracture. No primary bone lesion or focal pathologic process. Soft tissues and spinal canal: No prevertebral fluid or swelling. No visible canal hematoma. 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 Mild to moderate degenerative disc disease seen at C5-6. Mild to moderate facet DJD present bilaterally at C4-5 and C5-6. Moderate atlantoaxial degenerative changes also seen. Upper chest: Negative. Other: None. IMPRESSION: 7 x 7 mm intraparenchymal hemorrhage in right frontal lobe. No evidence of mass effect or midline shift. Mild right frontal and temporal subarachnoid hemorrhage. Mild intraventricular hemorrhage in both occipital horns. No evidence of acute hydrocephalus. Nondisplaced fracture of the lateral wall of the right maxillary sinus. No evidence of acute cervical spine fracture or subluxation. Degenerative spondylosis and congenital fusion, as described above. Critical Value/emergent results were called by telephone at the time of interpretation on 08/20/2016 at 8:41 pm to Dr. Trixie Dredge , who verbally acknowledged these results. Electronically Signed   By: Myles Rosenthal M.D.   On: 08/20/2016 20:43    Lab Data:  CBC:  Recent Labs Lab 08/20/16 2000 08/21/16 0320  WBC 12.5* 16.6*  NEUTROABS 10.6*  --   HGB 13.4 12.6*  HCT 41.5 39.6  MCV 90.8 93.0  PLT 295 233   Basic Metabolic  Panel:  Recent Labs Lab 08/20/16 2000 08/21/16 0320  NA 144 141  K 3.9 4.4  CL 109 109  CO2 28 21*  GLUCOSE 117* 126*  BUN 24* 23*  CREATININE 0.86 0.99  CALCIUM 10.2 9.6   GFR: Estimated Creatinine Clearance: 41.4 mL/min (by C-G formula based on SCr of 0.99 mg/dL). Liver Function Tests: No results for input(s): AST, ALT, ALKPHOS, BILITOT, PROT, ALBUMIN in the last 168 hours. No results for input(s): LIPASE, AMYLASE in the last 168 hours. No results for input(s): AMMONIA in the last 168 hours. Coagulation Profile:  Recent Labs Lab 08/20/16 2000  INR 0.93   Cardiac Enzymes:  Recent Labs Lab 08/21/16 1038  TROPONINI <  0.03   BNP (last 3 results) No results for input(s): PROBNP in the last 8760 hours. HbA1C: No results for input(s): HGBA1C in the last 72 hours. CBG:  Recent Labs Lab 08/22/16 0818 08/23/16 0817  GLUCAP 103* 99   Lipid Profile: No results for input(s): CHOL, HDL, LDLCALC, TRIG, CHOLHDL, LDLDIRECT in the last 72 hours. Thyroid Function Tests: No results for input(s): TSH, T4TOTAL, FREET4, T3FREE, THYROIDAB in the last 72 hours. Anemia Panel: No results for input(s): VITAMINB12, FOLATE, FERRITIN, TIBC, IRON, RETICCTPCT in the last 72 hours. Urine analysis:    Component Value Date/Time   COLORURINE YELLOW 08/21/2016 0516   APPEARANCEUR HAZY (A) 08/21/2016 0516   LABSPEC 1.021 08/21/2016 0516   PHURINE 5.0 08/21/2016 0516   GLUCOSEU NEGATIVE 08/21/2016 0516   HGBUR SMALL (A) 08/21/2016 0516   BILIRUBINUR NEGATIVE 08/21/2016 0516   KETONESUR NEGATIVE 08/21/2016 0516   PROTEINUR NEGATIVE 08/21/2016 0516   UROBILINOGEN 1.0 07/08/2009 0320   NITRITE NEGATIVE 08/21/2016 0516   LEUKOCYTESUR LARGE (A) 08/21/2016 0516     Adyn Hoes M.D. [facility] 08/23/2016, 11:51 AM  Pager: [phone] Between 7am to 7pm - call Pager - [phone]  After 7pm go to [email or web address] - password [email or web address]  Call night coverage person covering after 7pm

## 2016-08-23 NOTE — Progress Notes (Signed)
Vitals:   08/23/16 0000 08/23/16 0400 08/23/16 0500 08/23/16 0758  BP: 116/84 (!) 143/59  109/67  Pulse: 73 65    Resp: (!) 22 (!) 23    Temp: (!) 100.9 F (38.3 C) 98.7 F (37.1 C)  100.1 F (37.8 C)  TempSrc: Axillary Oral  Oral  SpO2: 93% 94%  95%  Weight:   60.4 kg (133 lb 1.6 oz)   Height:        CBC  Recent Labs  08/20/16 2000 08/21/16 0320  WBC 12.5* 16.6*  HGB 13.4 12.6*  HCT 41.5 39.6  PLT 295 233   BMET  Recent Labs  08/20/16 2000 08/21/16 0320  NA 144 141  K 3.9 4.4  CL 109 109  CO2 28 21*  GLUCOSE 117* 126*  BUN 24* 23*  CREATININE 0.86 0.99  CALCIUM 10.2 9.6    Patient resting in bed, appears comfortable. Opens eyes to voice. Follows commands, holding up 2 fingers to command, bilaterally. Pupils, 2.5 mm laterally, round, reactive to light.  Plan: No family at bedside. Continues to gradually become more consistently responsive. No further recommendations from a neurosurgical perspective.  Hewitt Shorts, MD 08/23/2016, 9:04 AM

## 2016-08-24 LAB — GLUCOSE, CAPILLARY: Glucose-Capillary: 110 mg/dL — ABNORMAL HIGH (ref 65–99)

## 2016-08-24 MED ORDER — IPRATROPIUM-ALBUTEROL 0.5-2.5 (3) MG/3ML IN SOLN: 3.0000 mL | RESPIRATORY_TRACT | Status: AC | PRN

## 2016-08-24 MED ORDER — AMOXICILLIN-POT CLAVULANATE 875-125 MG PO TABS: 1.0000 | ORAL_TABLET | Freq: Two times a day (BID) | ORAL | Status: AC

## 2016-08-24 MED ORDER — ACETAMINOPHEN 500 MG PO TABS: 500.0000 mg | ORAL_TABLET | Freq: Three times a day (TID) | ORAL | 0 refills | Status: AC

## 2016-08-24 NOTE — Discharge Summary (Signed)
Physician Discharge Summary   Patient ID: Michael [name] MRN: [id number] DOB/AGE: 81-Apr-1929 81 y.o.  Admit date: 08/20/2016 Discharge date: 08/24/2016  Primary Care Physician:  PROVIDER NOT IN SYSTEM  Discharge Diagnoses:   . Intraparenchymal hemorrhage of brain/Right frontal subarachnoid hemorrhage (HCC)   Right frontal intraparenchymal hematoma . Closed right hip fracture, initial encounter (HCC) . Aspiration pneumonia (HCC) . Parkinson's disease (HCC) . Dementia . Facial laceration . Right maxillary fracture    Consults: Neurosurgery, Dr Newell Coral Palliative medicine Speech therapy Orthopedics, Dr. Margarita Rana    Recommendations for Outpatient Follow-up:  1. Aspiration precaution 2. Strict fall precautions 3. Palliative medicine/hospice to follow at skilled nursing facility. Comfort care status   DIET: Dysphagia 1 diet with thin liquids    Allergies:  No Known Allergies   DISCHARGE MEDICATIONS: Current Discharge Medication List    START taking these medications   Details  amoxicillin-clavulanate (AUGMENTIN) 875-125 MG tablet Take 1 tablet by mouth 2 (two) times daily. X 7 days    ipratropium-albuterol (DUONEB) 0.5-2.5 (3) MG/3ML SOLN Take 3 mLs by nebulization every 4 (four) hours as needed (for wheezing or shortness of breath). Qty: 360 mL      CONTINUE these medications which have CHANGED   Details  acetaminophen (TYLENOL) 500 MG tablet Take 1 tablet (500 mg total) by mouth every 8 (eight) hours. Qty: 30 tablet, Refills: 0      CONTINUE these medications which have NOT CHANGED   Details  AMBULATORY NON FORMULARY MEDICATION Magic Cup Sig: One cup by mouth every day with lunch meal for weight loss    carbidopa-levodopa (SINEMET IR) 25-100 MG tablet 1.5 tablets by mouth three times daily. Dosing times : 1 hour before meals or 2 hours after meals.     Cholecalciferol (VITAMIN D-3) 1000 UNITS CAPS Take 1 capsule by mouth daily.     donepezil  (ARICEPT) 10 MG tablet Take 1 tablet by mouth at  bedtime Qty: 90 tablet, Refills: 3    fish oil-omega-3 fatty acids 1000 MG capsule Take 1,000 mg by mouth daily.     ketoconazole (NIZORAL) 2 % shampoo Apply 1 application topically 2 (two) times a week. Wed/saturday 3-11 shift until resolved    MALTODEXTRIN-XANTHAN GUM PO Give 1gm by mouth as needed due to high risk for aspiration    Multiple Vitamin (MULTIVITAMIN WITH MINERALS) TABS tablet Take 1 tablet by mouth daily.    multivitamin-lutein (OCUVITE-LUTEIN) CAPS capsule Take 1 capsule by mouth daily.    omeprazole (PRILOSEC) 20 MG capsule Take 20 mg by mouth daily.       STOP taking these medications     aspirin 325 MG tablet          Brief H and P: For complete details please refer to admission H and P, but in brief Michael [name] a 83 y.o.male with a medical history of Parkinson disease, dementia and GERD. He presented after having a traumatic fall at his SNF. He does not recall the event. He was found to have an intraparenchymal/temporal subarachnoid hemorrhage, nondisplaced maxillary fracture.   Hospital Course:   Intraparenchymal hemorrhage Subarachnoid hemorrhage Secondary to traumatic fall with injury to his head.   - Follow up CT head showed no change and minimal traumatic SAH and minimal increased size in small right frontal cerebral hemorrhage, no mass effect. Significant generalized atrophy  - No surgeryRecommended, Patient was closely followed by neurosurgery, Dr. Newell Coral -palliative medicine was consulted, after meeting with the family, recommended comfort  care status, skilled nursing facility with palliative medicine to follow  Closed right hip fracture - Secondary to fall. Orthopedic surgery, Dr. Eulah Pont discussed with family. Decision for no surgery, outpatient follow-up with Dr. Eulah Pont in 1-2 weeks.  Parkinson disease Dementia Patient close to baseline currently per family -Continue Sinemet and  Aricept with applesauce  Right maxillary fracture Right facial laceration Non-displaced fracture. Laceration repaired in ED -analgesics prn, family/son prefers Tylenol    Aspiration pneumonia (HCC) -Patient was noticed to be spiking fevers, leukocytosis, chest x-ray showed by basilar atelectasis or pneumonia more conspicuity than on the study 2 days ago. - Patient was placed on IV Zosyn, transitioned to oral Augmentin at the time of discharge. Recommended dysphagia 1 diet and strict aspiration precautions. P   Day of Discharge BP 121/76 (BP Location: Left Arm)   Pulse 66   Temp 98.2 F (36.8 C) (Axillary)   Resp 18   Ht  (1.575 m)   Wt 62 kg (136 lb 11.2 oz)   SpO2 100%   BMI 25.00 kg/m   Physical Exam: General: Alert and awake, keeping his eyes closed, NAD  HEENT: anicteric sclera, pupils reactive to light and accommodation CVS: S1-S2 clear no murmur rubs or gallops Chest:Decreased breath sounds at the bases  Abdomen: soft nontender, nondistended, normal bowel sounds Extremities: no cyanosis, clubbing or edema noted bilaterally    The results of significant diagnostics from this hospitalization (including imaging, microbiology, ancillary and laboratory) are listed below for reference.    LAB RESULTS: Basic Metabolic Panel:  Recent Labs Lab 08/20/16 2000 08/21/16 0320  NA 144 141  K 3.9 4.4  CL 109 109  CO2 28 21*  GLUCOSE 117* 126*  BUN 24* 23*  CREATININE 0.86 0.99  CALCIUM 10.2 9.6   Liver Function Tests: No results for input(s): AST, ALT, ALKPHOS, BILITOT, PROT, ALBUMIN in the last 168 hours. No results for input(s): LIPASE, AMYLASE in the last 168 hours. No results for input(s): AMMONIA in the last 168 hours. CBC:  Recent Labs Lab 08/20/16 2000 08/21/16 0320  WBC 12.5* 16.6*  NEUTROABS 10.6*  --   HGB 13.4 12.6*  HCT 41.5 39.6  MCV 90.8 93.0  PLT 295 233   Cardiac Enzymes:  Recent Labs Lab 08/21/16 1038  TROPONINI <0.03    BNP: Invalid input(s): POCBNP CBG:  Recent Labs Lab 08/23/16 0817 08/24/16 0805  GLUCAP 99 110*    Significant Diagnostic Studies:  Ct Head Wo Contrast  Result Date: 08/20/2016 CLINICAL DATA:  Fall. Head, facial, and neck pain and swelling. Initial encounter. EXAM: CT HEAD WITHOUT CONTRAST CT MAXILLOFACIAL WITHOUT CONTRAST CT CERVICAL SPINE WITHOUT CONTRAST TECHNIQUE: Multidetector CT imaging of the head, cervical spine, and maxillofacial structures were performed using the standard protocol without intravenous contrast. Multiplanar CT image reconstructions of the cervical spine and maxillofacial structures were also generated. COMPARISON:  CT on 02/14/2016 and cervical spine CT on 08/29/2015 FINDINGS: CT HEAD FINDINGS Brain: 7 x 7 mm intraparenchymal hemorrhage seen in the right frontal lobe. Mild subarachnoid hemorrhage is seen in the right frontal and temporal regions, and mild intraventricular hemorrhage is seen in the occipital horns bilaterally. Ventricles remain stable in size. No evidence of acute cerebral infarct, edema, mass effect, or midline shift. Mild to moderate diffuse cerebral atrophy again noted. Vascular: No hyperdense vessel or unexpected calcification. Skull: Normal. Negative for fracture or focal lesion. Other: None. CT MAXILLOFACIAL FINDINGS Osseous: Nondisplaced fracture is seen involving the lateral wall of the right maxillary  sinus. No other are facial bone fractures are identified. Orbits: No evidence of fracture. Globes and other intraorbital anatomy are unremarkable. Sinuses: Right maxillary sinus air-fluid level. Soft tissues: Right frontal, maxillary, and preseptal soft tissue swelling is seen. CT CERVICAL SPINE FINDINGS Alignment: Normal. Skull base and vertebrae: No acute fracture. No primary bone lesion or focal pathologic process. Soft tissues and spinal canal: No prevertebral fluid or swelling. No visible canal hematoma. Disc levels: Congenital fusion seen at  C2-3, C3-4, and C6-7. Mild to moderate degenerative disc disease seen at C5-6. Mild to moderate facet DJD present bilaterally at C4-5 and C5-6. Moderate atlantoaxial degenerative changes also seen. Upper chest: Negative. Other: None. IMPRESSION: 7 x 7 mm intraparenchymal hemorrhage in right frontal lobe. No evidence of mass effect or midline shift. Mild right frontal and temporal subarachnoid hemorrhage. Mild intraventricular hemorrhage in both occipital horns. No evidence of acute hydrocephalus. Nondisplaced fracture of the lateral wall of the right maxillary sinus. No evidence of acute cervical spine fracture or subluxation. Degenerative spondylosis and congenital fusion, as described above. Critical Value/emergent results were called by telephone at the time of interpretation on 08/20/2016 at 8:41 pm to Dr. Trixie Dredge , who verbally acknowledged these results. Electronically Signed   By: Myles Rosenthal M.D.   On: 08/20/2016 20:43   Ct Cervical Spine Wo Contrast  Result Date: 08/20/2016 CLINICAL DATA:  Fall. Head, facial, and neck pain and swelling. Initial encounter. EXAM: CT HEAD WITHOUT CONTRAST CT MAXILLOFACIAL WITHOUT CONTRAST CT CERVICAL SPINE WITHOUT CONTRAST TECHNIQUE: Multidetector CT imaging of the head, cervical spine, and maxillofacial structures were performed using the standard protocol without intravenous contrast. Multiplanar CT image reconstructions of the cervical spine and maxillofacial structures were also generated. COMPARISON:  CT on 02/14/2016 and cervical spine CT on 08/29/2015 FINDINGS: CT HEAD FINDINGS Brain: 7 x 7 mm intraparenchymal hemorrhage seen in the right frontal lobe. Mild subarachnoid hemorrhage is seen in the right frontal and temporal regions, and mild intraventricular hemorrhage is seen in the occipital horns bilaterally. Ventricles remain stable in size. No evidence of acute cerebral infarct, edema, mass effect, or midline shift. Mild to moderate diffuse cerebral atrophy again  noted. Vascular: No hyperdense vessel or unexpected calcification. Skull: Normal. Negative for fracture or focal lesion. Other: None. CT MAXILLOFACIAL FINDINGS Osseous: Nondisplaced fracture is seen involving the lateral wall of the right maxillary sinus. No other are facial bone fractures are identified. Orbits: No evidence of fracture. Globes and other intraorbital anatomy are unremarkable. Sinuses: Right maxillary sinus air-fluid level. Soft tissues: Right frontal, maxillary, and preseptal soft tissue swelling is seen. CT CERVICAL SPINE FINDINGS Alignment: Normal. Skull base and vertebrae: No acute fracture. No primary bone lesion or focal pathologic process. Soft tissues and spinal canal: No prevertebral fluid or swelling. No visible canal hematoma. 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 Mild to moderate degenerative disc disease seen at C5-6. Mild to moderate facet DJD present bilaterally at C4-5 and C5-6. Moderate atlantoaxial degenerative changes also seen. Upper chest: Negative. Other: None. IMPRESSION: 7 x 7 mm intraparenchymal hemorrhage in right frontal lobe. No evidence of mass effect or midline shift. Mild right frontal and temporal subarachnoid hemorrhage. Mild intraventricular hemorrhage in both occipital horns. No evidence of acute hydrocephalus. Nondisplaced fracture of the lateral wall of the right maxillary sinus. No evidence of acute cervical spine fracture or subluxation. Degenerative spondylosis and congenital fusion, as described above. Critical Value/emergent results were called by telephone at the time of interpretation on  08/20/2016 at 8:41 pm to Dr. Trixie Dredge , who verbally acknowledged these results. Electronically Signed   By: Myles Rosenthal M.D.   On: 08/20/2016 20:43   Dg Chest Port 1 View  Result Date: 08/20/2016 CLINICAL DATA:  Fall.  Right hip fracture.  Pre-op respiratory exam EXAM: PORTABLE CHEST 1 VIEW COMPARISON:  08/31/2015 FINDINGS: Heart size remains  normal. Aortic atherosclerosis. Low lung volumes again noted. Scarring again seen in left lung base. No evidence of pulmonary infiltrate or edema. No evidence of pneumothorax or pleural effusion. Several old right rib fracture deformities again noted. IMPRESSION: Stable left basilar scarring.  No active lung disease. Aortic atherosclerosis. Electronically Signed   By: Myles Rosenthal M.D.   On: 08/20/2016 20:12   Dg Hip Unilat W Or Wo Pelvis 2-3 Views Right  Result Date: 08/20/2016 CLINICAL DATA:  Fall.  Right hip pain.  Initial encounter. EXAM: DG HIP (WITH OR WITHOUT PELVIS) 2-3V RIGHT COMPARISON:  None. FINDINGS: Impacted subcapital right femoral neck fracture is seen. No evidence of dislocation. Generalized osteopenia. IMPRESSION: Impacted subcapital right femoral neck fracture. Electronically Signed   By: Myles Rosenthal M.D.   On: 08/20/2016 20:10   Ct Maxillofacial Wo Cm  Result Date: 08/20/2016 CLINICAL DATA:  Fall. Head, facial, and neck pain and swelling. Initial encounter. EXAM: CT HEAD WITHOUT CONTRAST CT MAXILLOFACIAL WITHOUT CONTRAST CT CERVICAL SPINE WITHOUT CONTRAST TECHNIQUE: Multidetector CT imaging of the head, cervical spine, and maxillofacial structures were performed using the standard protocol without intravenous contrast. Multiplanar CT image reconstructions of the cervical spine and maxillofacial structures were also generated. COMPARISON:  CT on 02/14/2016 and cervical spine CT on 08/29/2015 FINDINGS: CT HEAD FINDINGS Brain: 7 x 7 mm intraparenchymal hemorrhage seen in the right frontal lobe. Mild subarachnoid hemorrhage is seen in the right frontal and temporal regions, and mild intraventricular hemorrhage is seen in the occipital horns bilaterally. Ventricles remain stable in size. No evidence of acute cerebral infarct, edema, mass effect, or midline shift. Mild to moderate diffuse cerebral atrophy again noted. Vascular: No hyperdense vessel or unexpected calcification. Skull: Normal.  Negative for fracture or focal lesion. Other: None. CT MAXILLOFACIAL FINDINGS Osseous: Nondisplaced fracture is seen involving the lateral wall of the right maxillary sinus. No other are facial bone fractures are identified. Orbits: No evidence of fracture. Globes and other intraorbital anatomy are unremarkable. Sinuses: Right maxillary sinus air-fluid level. Soft tissues: Right frontal, maxillary, and preseptal soft tissue swelling is seen. CT CERVICAL SPINE FINDINGS Alignment: Normal. Skull base and vertebrae: No acute fracture. No primary bone lesion or focal pathologic process. Soft tissues and spinal canal: No prevertebral fluid or swelling. No visible canal hematoma. 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 Mild to moderate degenerative disc disease seen at C5-6. Mild to moderate facet DJD present bilaterally at C4-5 and C5-6. Moderate atlantoaxial degenerative changes also seen. Upper chest: Negative. Other: None. IMPRESSION: 7 x 7 mm intraparenchymal hemorrhage in right frontal lobe. No evidence of mass effect or midline shift. Mild right frontal and temporal subarachnoid hemorrhage. Mild intraventricular hemorrhage in both occipital horns. No evidence of acute hydrocephalus. Nondisplaced fracture of the lateral wall of the right maxillary sinus. No evidence of acute cervical spine fracture or subluxation. Degenerative spondylosis and congenital fusion, as described above. Critical Value/emergent results were called by telephone at the time of interpretation on 08/20/2016 at 8:41 pm to Dr. Trixie Dredge , who verbally acknowledged these results. Electronically Signed   By: Myles Rosenthal  M.D.   On: 08/20/2016 20:43    2D ECHO:   Disposition and Follow-up: Discharge Instructions    Increase activity slowly    Complete by:  As directed        DISPOSITION: Skilled nursing facility  DISCHARGE FOLLOW-UP Follow-up Information    MURPHY, TIMOTHY D, MD. Schedule an appointment as soon  as possible for a visit in 2 week(s).   Specialty:  Orthopedic Surgery Contact information: [address] [place] [address] [phone]            Time spent on Discharge: 28 minutes  Signed:   Thad Ranger M.D. [facility] 08/24/2016, 12:26 PM Pager: [phone]

## 2016-08-24 NOTE — Progress Notes (Signed)
Daily Progress Note   Patient Name: Michael [name]       Date: 08/24/2016 DOB: [birth year]  Age: 83 y.o. MRN#: [id number] Attending Physician: Mendel Corning, MD Primary Care Physician: PROVIDER NOT IN SYSTEM Admit Date: 08/20/2016  Reason for Consultation/Follow-up: Establishing goals of care  Subjective: Met again with patient's son and wife. Reviewed the overall trajectory of Parkinson's and patient's functional status decline over the last two years. After some review patient and spouse were able to realize patient's significant decline- especially in the last year- at baseline- he is no longer feeding himself, po intake has greatly decreased, he verbalizes minimal understandable words, he is incontinent of stool and urine, he frequently is unable to hold himself upright. He mainly lives a bed to chair existence. He was ambulating a few steps with physical therapy and a walker, but this had to be stopped due to gait disturbance. Discussed with them that patient is likely at end stage of his disease. Encouraged them to be prepared that he will not regain ability or desire to eat full meals- again discussed role of comfort feeding and artificial hydration and nutrition at end of life. Also discussed risk of aspiration and recurrence of pneumonia. Both verbalized they would not want patient to return to hospital for any treatments in the future- they would prefer to treat him symptomatically and keep him comfortable at [facility] with Hospice support and experience a dignified peaceful death.    Review of Systems  Unable to perform ROS: Dementia    Length of Stay: 4  Current Medications: Scheduled Meds:  . carbidopa-levodopa  1.5 tablet Oral TID  . cholecalciferol  1,000 Units Oral Daily  .  donepezil  10 mg Oral QHS  . multivitamin with minerals  1 tablet Oral Daily  . omega-3 acid ethyl esters  1,000 mg Oral Daily  . pantoprazole  40 mg Oral Daily  . piperacillin-tazobactam (ZOSYN)  IV  3.375 g Intravenous Q8H  . sodium chloride flush  3 mL Intravenous Q12H    Continuous Infusions: . dextrose 5 % and 0.45% NaCl 75 mL/hr at 08/24/16 0800    PRN Meds: acetaminophen, hydrALAZINE, HYDROcodone-acetaminophen, morphine injection  Physical Exam          Vital Signs: BP 121/76 (BP Location: Left Arm)   Pulse  66   Temp 98.2 F (36.8 C) (Axillary)   Resp 18   Ht _0  (1.575 m)   Wt 62 kg (136 lb 11.2 oz)   SpO2 100%   BMI 25.00 kg/m  SpO2: SpO2: 100 % O2 Device: O2 Device: Nasal Cannula O2 Flow Rate: O2 Flow Rate (L/min): 2 L/min  Intake/output summary:  Intake/Output Summary (Last 24 hours) at 08/24/16 1133 Last data filed at 08/24/16 0800  Gross per 24 hour  Intake             2250 ml  Output                0 ml  Net             2250 ml   LBM: Last BM Date: 08/22/16 Baseline Weight: Weight: 64.4 kg (142 lb) Most recent weight: Weight: 62 kg (136 lb 11.2 oz)       Palliative Assessment/Data: PPS: 10%     Patient Active Problem List   Diagnosis Date Noted  . Aspiration pneumonia ([place]) 08/23/2016  . Palliative care by specialist   . Advance care planning   . Closed fracture of neck of right femur ([place])   . Intraventricular hemorrhage ([place])   . Facial laceration 08/20/2016  . SAH (subarachnoid hemorrhage) ([place]) 08/20/2016  . Intraparenchymal hemorrhage of brain ([place]) 08/20/2016  . Closed right hip fracture, initial encounter ([place]) 08/20/2016  . Moderate protein-calorie malnutrition ([place]) 04/26/2016  . 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 BPH (benign prostatic hyperplasia) 12/31/2015  . GERD (gastroesophageal reflux disease) 12/31/2015  . Vitamin D deficiency 10/28/2015  . Facial droop   . Encounter for palliative care   . Goals of care,  counseling/discussion   . Dysphagia 09/01/2015  . Dementia 09/01/2015  . Metabolic encephalopathy 79/39/0300  . Fall 08/30/2015  . Hyperammonemia ([place]) 08/30/2015  . Parkinson's disease ([place]) 03/27/2013  . Other B-complex deficiencies 09/16/2012    Palliative Care Assessment & Plan   Patient Profile: 83 y.o. male  with past medical history of Parkinson's, dementia, GERD,  admitted on 08/20/2016 after a fall at [facility] resulting in a R hip fracture, maxillary fracture, and subarachnoid and right frontal cerebral hemorrhage. Palliative medicine consulted for [place].     Assessment/Recommendations/Plan   D/C to [facility] with Hospice  GOC- comfort  Treat current pneumonia, but no plans to return to hospital in future- GOC would be tor treat symptoms only  Goals of Care and Additional Recommendations:  Limitations on Scope of Treatment: Avoid Hospitalization and Full Comfort Care  Code Status:  DNR  Prognosis:   < 4 weeks d/t adv dementia, recent fall, no meaningful po intake, dysphagia with aspiration  Discharge Planning:  [place] with Hospice  Care plan was discussed with patient's son and wife.  Thank you for allowing the Palliative Medicine Team to assist in the care of this patient.   Time In: 1100 Time Out: 1200 Total Time 60 minutes Prolonged Time Billed no      Greater than 50%  of this time was spent counseling and coordinating care related to the above assessment and plan.  Mariana Kaufman, AGNP-C Palliative Medicine   Please contact Palliative Medicine Team phone at [phone] for questions and concerns.

## 2016-08-24 NOTE — Clinical Social Work Placement (Signed)
   CLINICAL SOCIAL WORK PLACEMENT  NOTE  Date:  08/24/2016  Patient Details  Name: Michael [name] MRN: [id number] Date of Birth: 1927-06-06  Clinical Social Work is seeking post-discharge placement for this patient at the Skilled  Nursing Facility level of care (*CSW will initial, date and re-position this form in  chart as items are completed):      Patient/family provided with [facility] Clinical Social Work Department's list of facilities offering this level of care within the geographic area requested by the patient (or if unable, by the patient's family).  No   Patient/family informed of their freedom to choose among providers that offer the needed level of care, that participate in Medicare, Medicaid or managed care program needed by the patient, have an available bed and are willing to accept the patient.      Patient/family informed of [place]'s ownership interest in [facility] and [facility], as well as of the fact that they are under no obligation to receive care at these facilities.  PASRR submitted to EDS on       PASRR number received on 08/22/16     Existing PASRR number confirmed on       FL2 transmitted to all facilities in geographic area requested by pt/family on 08/22/16     FL2 transmitted to all facilities within larger geographic area on 08/22/16     Patient informed that his/her managed care company has contracts with or will negotiate with certain facilities, including the following:        Yes   Patient/family informed of bed offers received.  Patient chooses bed at [facility]     Physician recommends and patient chooses bed at      Patient to be transferred to [facility] on 08/24/16.  Patient to be transferred to facility by [facility]     Patient family notified on 08/24/16 of transfer.  Name of family member notified:  Onalee Hua, Son/HCPOA     PHYSICIAN Please prepare priority discharge summary, including medications, Please prepare  prescriptions, Please sign DNR     Additional Comment:    _______________________________________________ Maree Krabbe, LCSW 08/24/2016, 10:35 AM

## 2016-08-24 NOTE — Clinical Social Work Note (Signed)
Clinical Social Worker facilitated patient discharge including contacting patient family and facility to confirm patient discharge plans.  Clinical information faxed to facility and family agreeable with plan.  CSW arranged ambulance transport via [facility] to [facility] .  RN to call [phone] for report prior to discharge.  Clinical Social Worker will sign off for now as social work intervention is no longer needed. Please consult us again if new need arises.  Jarid Sasso [place], LCSWA [phone]    

## 2016-08-24 NOTE — Progress Notes (Signed)
  Speech Language Pathology Treatment: Dysphagia  Patient Details Name: Michael [name] MRN: [id number] DOB: 12-27-1927 Today's Date: 08/24/2016 Time: 0937-1000 SLP Time Calculation (min) (ACUTE ONLY): 23 min  Assessment / Plan / Recommendation Clinical Impression  Pt seen this am for trials of comfort feeds; ice chips, teaspoon of water and bites of applesauce. After repositioning and oral care pt was responsive to verbal and tactile cue for awareness of PO, though his eyes remained shut. Pt began reaching out and opening mouth in anticipation of PO and began verbalizing, initially unintelligibly and then more clearly, in conversation with SLP and family. He said the applesauce was good and that he wanted more. All bites and sips resulted in immediate and delayed weak coughing suggesting late sensation and poor ejection of aspirate. His son reported that this was normal for him, but SLP tried to discuss how pts ability to tolerate dysphagia and aspiration is expected to be much worse than before his fall and that he would be likely to develop respiratory complications from intake. Briefly discussed with Palliative NP. Recommend pt continue comfort feeding of ice chips, puree and teaspoons of water after oral care as appropriate.   HPI HPI: 83 y.o.malewith medical history significant for Parkinson's disease, dementia, and GERD admitted to ED from SNF after fall, sustaining intraparenchymal/temporal subarachnoid hemorrhage, right maxillary fx, right femoral neck fracture.  Family has met with Palliative medicine, and they are electing a comfort approach.       SLP Plan  Continue with current plan of care       Recommendations  Diet recommendations: Other(comment) (comfort feeds as appropriate see note. ) Liquids provided via: Teaspoon Medication Administration: Crushed with puree Supervision: Full supervision/cueing for compensatory strategies;Trained caregiver to feed patient Compensations:  Slow rate;Small sips/bites;Minimize environmental distractions Postural Changes and/or Swallow Maneuvers: Seated upright 90 degrees                Oral Care Recommendations: Oral care QID;Oral care prior to ice chip/H20 Follow up Recommendations: Skilled Nursing facility SLP Visit Diagnosis: Dysphagia, unspecified (R13.10) Plan: Continue with current plan of care       GO               [facility], [place] CCC-SLP [phone]  Lynann Beaver 08/24/2016, 10:09 AM

## 2016-08-24 NOTE — Care Management Note (Signed)
Case Management Note Original Note by Lawerance Sabal 08/22/16  Patient Details  Name: Michael [name] MRN: [id number] Date of Birth: April 26, 1928  Subjective/Objective:                 Admitted from [facility]. CSW following.    Action/Plan:   Expected Discharge Date:  08/23/16               Expected Discharge Plan:  Skilled Nursing Facility  In-House Referral:  Clinical Social Work  Discharge planning Services  CM Consult  Post Acute Care Choice:    Choice offered to:     DME Arranged:    DME Agency:     HH Arranged:    HH Agency:     Status of Service:  In process, will continue to follow  If discussed at Long Length of Stay Meetings, dates discussed:    Additional Comments: 08/24/2016 CM contacted MD in regards to discharge consideration.  Pts family has elected to forgo aggressive measures and transition to comfort care at [facility].  CSW consulted for discharge  Artelia Laroche 08/24/2016, 10:18 AM

## 2016-08-25 ENCOUNTER — Encounter: Payer: Self-pay | Admitting: Internal Medicine

## 2016-08-25 ENCOUNTER — Non-Acute Institutional Stay (SKILLED_NURSING_FACILITY): Payer: Medicare Other | Admitting: Internal Medicine

## 2016-08-25 DIAGNOSIS — R1310 Dysphagia, unspecified: Secondary | ICD-10-CM | POA: Diagnosis not present

## 2016-08-25 DIAGNOSIS — D62 Acute posthemorrhagic anemia: Secondary | ICD-10-CM

## 2016-08-25 DIAGNOSIS — S06310S Contusion and laceration of right cerebrum without loss of consciousness, sequela: Secondary | ICD-10-CM

## 2016-08-25 DIAGNOSIS — F0280 Dementia in other diseases classified elsewhere without behavioral disturbance: Secondary | ICD-10-CM | POA: Diagnosis not present

## 2016-08-25 DIAGNOSIS — S0181XS Laceration without foreign body of other part of head, sequela: Secondary | ICD-10-CM

## 2016-08-25 DIAGNOSIS — S06340S Traumatic hemorrhage of right cerebrum without loss of consciousness, sequela: Secondary | ICD-10-CM | POA: Diagnosis not present

## 2016-08-25 DIAGNOSIS — S72001S Fracture of unspecified part of neck of right femur, sequela: Secondary | ICD-10-CM | POA: Diagnosis not present

## 2016-08-25 DIAGNOSIS — G20 Parkinson's disease: Secondary | ICD-10-CM | POA: Diagnosis not present

## 2016-08-25 DIAGNOSIS — K219 Gastro-esophageal reflux disease without esophagitis: Secondary | ICD-10-CM

## 2016-08-25 DIAGNOSIS — J690 Pneumonitis due to inhalation of food and vomit: Secondary | ICD-10-CM | POA: Diagnosis not present

## 2016-08-25 DIAGNOSIS — R5381 Other malaise: Secondary | ICD-10-CM | POA: Diagnosis not present

## 2016-08-25 NOTE — Progress Notes (Signed)
LOCATION: [facility]  PCP: PROVIDER NOT IN SYSTEM   Code Status: DNR  Goals of care: Advanced Directive information Advanced Directives 08/21/2016  Does Patient Have a Medical Advance Directive? Yes  Type of Estate agent of [place];Living will;Out of facility DNR (pink MOST or yellow form)  Does patient want to make changes to medical advance directive? No - Patient declined  Copy of Healthcare Power of Attorney in Chart? No - copy requested  Would patient like information on creating a medical advance directive? -  Pre-existing out of facility DNR order (yellow form or pink MOST form) Physician notified to receive inpatient order       Extended Emergency Contact Information Primary Emergency Contact: Vanschaick,[name] Address: [address]          [place] [address] [place] Home Phone: [phone] Relation: None Secondary Emergency Contact: [facility] Address: [address]          [place], [place] [address] [name] Home Phone: [phone] Relation: Son   No Known Allergies  Chief Complaint  Patient presents with  . Readmit To SNF    Readmission Visit      HPI:  Patient is a 83 y.o. male seen today for Long-term care post hospital re-admission from 08/20/2016-08/24/2016 post fall at the skilled nursing facility with right hip pain and right forehead laceration. Imaging of brain was suggestive of intraparenchymal hemorrhage, subarachnoid hemorrhage and intraventricular hemorrhage along with right maxillary fracture. Neurosurgery was consulted and recommended medical management. His laceration was repaired in the emergency department. He was provided with pain management. He also sustained a right hip fracture from this fall and orthopedic surgery was consulted. Noncooperative management was recommended given his goals of care. He was started on antibiotic for aspiration pneumonia. Palliative care was  consulted in the hospital and comfort care has been decided upon. He has medical history of our concerns disease, dementia, gastroesophageal reflux disease and others. He is seen in his room today.  Review of Systunable to obtain with his dementia    Past Medical History:  Diagnosis Date  . Hallucinations   . 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 Other vitamin B12 deficiency anemia   . Paralysis agitans (HCC)   . Parkinson's disease (HCC) 03/27/2013  . Unspecified transient cerebral ischemia    Past Surgical History:  Procedure Laterality Date  . APPENDECTOMY    . CHOLECYSTECTOMY    . PTCA  1987  . TRANSURETHRAL RESECTION OF PROSTATE     Social History:   reports that he has never smoked. He has never used smokeless tobacco. He reports that he does not drink alcohol or use drugs.  Family History  Problem Relation Age of Onset  . Parkinson's disease Sister   . Cancer Brother   . Parkinson's disease Daughter 77    Medications: Allergies as of 08/25/2016   No Known Allergies     Medication List       Accurate as of 08/25/16 10:53 AM. Always use your most recent med list.          acetaminophen 500 MG tablet Commonly known as:  TYLENOL Take 1 tablet (500 mg total) by mouth every 8 (eight) hours.   AMBULATORY NON FORMULARY MEDICATION Magic Cup Sig: One cup by mouth every day with lunch meal for weight loss   amoxicillin-clavulanate 875-125 MG tablet Commonly known as:  AUGMENTIN Take 1 tablet by mouth  2 (two) times daily. X 7 days   carbidopa-levodopa 25-100 MG tablet Commonly known as:  SINEMET IR 1.5 tablets by mouth three times daily. Dosing times : 1 hour before meals or 2 hours after meals.   donepezil 10 MG tablet Commonly known as:  ARICEPT Take 1 tablet by mouth at  bedtime   fish oil-omega-3 fatty acids 1000 MG capsule Take 1,000 mg by mouth daily.   ipratropium-albuterol 0.5-2.5 (3) MG/3ML Soln Commonly  known as:  DUONEB Take 3 mLs by nebulization every 4 (four) hours as needed (for wheezing or shortness of breath).   ketoconazole 2 % shampoo Commonly known as:  NIZORAL Apply 1 application topically 2 (two) times a week. Wed/saturday 3-11 shift until resolved   MALTODEXTRIN-XANTHAN GUM PO Give 1gm by mouth as needed due to high risk for aspiration   multivitamin with minerals Tabs tablet Take 1 tablet by mouth daily.   multivitamin-lutein Caps capsule Take 1 capsule by mouth daily.   omeprazole 20 MG capsule Commonly known as:  PRILOSEC Take 20 mg by mouth daily.   Vitamin D-3 1000 units Caps Take 1 capsule by mouth daily.       Immunizations: Immunization History  Administered Date(s) Administered  . Influenza-Unspecified 02/21/2016  . PPD Test 09/05/2015, 09/20/2015  . Tdap 08/20/2016     Physical Exam:  Vitals:   08/25/16 1049  BP: 134/86  Pulse: 78  Resp: 18  Temp: 98.1 F (36.7 C)  TempSrc: Oral  SpO2: 95%  Weight: 141 lb (64 kg)  Height:  (1.575 m)   Body mass index is 25.79 kg/m.  General- elderly Male, frail and chronically ill appearing, in no acute distress  Head- normocephalic, atraumatic Nose- no nasal discharge Throat- moist mucus membrane, normal oropharynx, dentition  Eyes- PERRLA, EOMI, no pallor, no icterus, no discharge, normal conjunctiva, normal sclera, resolving hematoma around the right orbit  Neck- no cervical lymphadenopathy Cardiovascular- normal s1,s2, no murmur Respiratory- Decreased breath sound to lung bases, no wheeze, no rhonchi, no crackles, no use of accessory muscles, on 2.5 L oxygen by nasal cannula  Abdomen- bowel sounds present, soft, non tender, no guarding or rigidity Musculoskeletal- able to move all 4 extremities, limited range of motion with right leg, generalized weakness, trace leg edema  Neurology- Unable to assess  Skin- warm and dry, laceration to right forehead with sutures in place PsycAdvanced  dementia  Labs reviewed: Basic Metabolic Panel:  Recent Labs  16/10/96 0455  09/05/15 0539 09/08/15 08/20/16 2000 08/21/16 0320  NA 143  < > 142 141 144 141  K 4.3  < > 4.0 4.6 3.9 4.4  CL 112*  < > 108  --  109 109  CO2 24  < > 26  --  28 21*  GLUCOSE 100*  < > 106*  --  117* 126*  BUN 24*  < > 14 14 24* 23*  CREATININE 1.05  < > 0.83 0.9 0.86 0.99  CALCIUM 9.1  < > 9.8  --  10.2 9.6  MG 1.9  --   --   --   --   --   < > = values in this interval not displayed. Liver Function Tests:  Recent Labs  08/30/15 0450 08/31/15 2013 09/08/15  AST 20 33 38  ALT 14* 7* 27  ALKPHOS 54 61 69  BILITOT 0.9 0.6  --   PROT 6.0* 6.1*  --   ALBUMIN 3.3* 3.2*  --    No  results for input(s): LIPASE, AMYLASE in the last 8760 hours.  Recent Labs  08/29/15 2041 08/31/15 0459 09/03/15 1146  AMMONIA 137* 26 26   CBC:  Recent Labs  08/30/15 0450 08/31/15 2013  09/05/15 0539 09/08/15 08/20/16 2000 08/21/16 0320  WBC 8.5 16.0*  < > 5.2 7.3 12.5* 16.6*  NEUTROABS 5.5 14.2*  --   --   --  10.6*  --   HGB 11.6* 11.4*  < > 10.6* 11.9* 13.4 12.6*  HCT 35.3* 34.2*  < > 31.9* 38* 41.5 39.6  MCV 91.2 91.0  < > 89.9  --  90.8 93.0  PLT 251 215  < > 230 375 295 233  < > = values in this interval not displayed. Cardiac Enzymes:  Recent Labs  08/21/16 1038  TROPONINI <0.03   BNP: Invalid input(s): POCBNP CBG:  Recent Labs  08/22/16 0818 08/23/16 0817 08/24/16 0805  GLUCAP 103* 99 110*    Radiological Exams: Ct Head Wo Contrast  Result Date: 08/22/2016 CLINICAL DATA:  Intracranial hemorrhage EXAM: CT HEAD WITHOUT CONTRAST TECHNIQUE: Contiguous axial images were obtained from the base of the skull through the vertex without intravenous contrast. COMPARISON:  Head CT 08/20/2016 FINDINGS: Brain: Slight increase in size of right frontal intraparenchymal hematoma, now measuring 10 x 8 mm, previously 7 x 7 mm. There is subarachnoid blood again seen layering along the right  anterolateral convexity. The extra-axial CSF space on the right has increased in size. The size and configuration of the ventricles are unchanged. Small amount of blood is present within both occipital horns. Small amount of subarachnoid blood of along the right parietal lobe appears new from the prior study, possibly owing to redistribution. There is no new intraparenchymal hematoma. No midline shift or herniation. Vascular: No hyperdense vessel or unexpected calcification. Skull: Nondisplaced lateral right maxillary sinus wall fracture, unchanged. Sinuses/Orbits: The visualized portions of the paranasal sinuses and mastoid air cells are free of fluid. No advanced mucosal thickening. The visualized orbits are normal. Other: None IMPRESSION: 1. Minimally increased size of right frontal intraparenchymal hematoma, possibly exaggerated by differences in slice selection. No new intraparenchymal blood. 2. Unchanged right frontal convexity subarachnoid blood. New right parietal subarachnoid blood products may be secondary to redistribution. 3. Unchanged layering blood in the occipital horns without hydrocephalus. 4. No midline shift or herniation. 5. Increased size of the right hemispheric extra-axial CSF space. This may indicate the presence of subdural hygroma. Electronically Signed   By: Deatra Robinson M.D.   On: 08/22/2016 07:10   Ct Head Wo Contrast  Result Date: 08/20/2016 CLINICAL DATA:  Fall. Head, facial, and neck pain and swelling. Initial encounter. EXAM: CT HEAD WITHOUT CONTRAST CT MAXILLOFACIAL WITHOUT CONTRAST CT CERVICAL SPINE WITHOUT CONTRAST TECHNIQUE: Multidetector CT imaging of the head, cervical spine, and maxillofacial structures were performed using the standard protocol without intravenous contrast. Multiplanar CT image reconstructions of the cervical spine and maxillofacial structures were also generated. COMPARISON:  CT on 02/14/2016 and cervical spine CT on 08/29/2015 FINDINGS: CT HEAD FINDINGS  Brain: 7 x 7 mm intraparenchymal hemorrhage seen in the right frontal lobe. Mild subarachnoid hemorrhage is seen in the right frontal and temporal regions, and mild intraventricular hemorrhage is seen in the occipital horns bilaterally. Ventricles remain stable in size. No evidence of acute cerebral infarct, edema, mass effect, or midline shift. Mild to moderate diffuse cerebral atrophy again noted. Vascular: No hyperdense vessel or unexpected calcification. Skull: Normal. Negative for fracture or focal lesion. Other: None.  CT MAXILLOFACIAL FINDINGS Osseous: Nondisplaced fracture is seen involving the lateral wall of the right maxillary sinus. No other are facial bone fractures are identified. Orbits: No evidence of fracture. Globes and other intraorbital anatomy are unremarkable. Sinuses: Right maxillary sinus air-fluid level. Soft tissues: Right frontal, maxillary, and preseptal soft tissue swelling is seen. CT CERVICAL SPINE FINDINGS Alignment: Normal. Skull base and vertebrae: No acute fracture. No primary bone lesion or focal pathologic process. Soft tissues and spinal canal: No prevertebral fluid or swelling. No visible canal hematoma. 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 Mild to moderate degenerative disc disease seen at C5-6. Mild to moderate facet DJD present bilaterally at C4-5 and C5-6. Moderate atlantoaxial degenerative changes also seen. Upper chest: Negative. Other: None. IMPRESSION: 7 x 7 mm intraparenchymal hemorrhage in right frontal lobe. No evidence of mass effect or midline shift. Mild right frontal and temporal subarachnoid hemorrhage. Mild intraventricular hemorrhage in both occipital horns. No evidence of acute hydrocephalus. Nondisplaced fracture of the lateral wall of the right maxillary sinus. No evidence of acute cervical spine fracture or subluxation. Degenerative spondylosis and congenital fusion, as described above. Critical Value/emergent results were called by  telephone at the time of interpretation on 08/20/2016 at 8:41 pm to Dr. Trixie Dredge , who verbally acknowledged these results. Electronically Signed   By: Myles Rosenthal M.D.   On: 08/20/2016 20:43   Ct Cervical Spine Wo Contrast  Result Date: 08/20/2016 CLINICAL DATA:  Fall. Head, facial, and neck pain and swelling. Initial encounter. EXAM: CT HEAD WITHOUT CONTRAST CT MAXILLOFACIAL WITHOUT CONTRAST CT CERVICAL SPINE WITHOUT CONTRAST TECHNIQUE: Multidetector CT imaging of the head, cervical spine, and maxillofacial structures were performed using the standard protocol without intravenous contrast. Multiplanar CT image reconstructions of the cervical spine and maxillofacial structures were also generated. COMPARISON:  CT on 02/14/2016 and cervical spine CT on 08/29/2015 FINDINGS: CT HEAD FINDINGS Brain: 7 x 7 mm intraparenchymal hemorrhage seen in the right frontal lobe. Mild subarachnoid hemorrhage is seen in the right frontal and temporal regions, and mild intraventricular hemorrhage is seen in the occipital horns bilaterally. Ventricles remain stable in size. No evidence of acute cerebral infarct, edema, mass effect, or midline shift. Mild to moderate diffuse cerebral atrophy again noted. Vascular: No hyperdense vessel or unexpected calcification. Skull: Normal. Negative for fracture or focal lesion. Other: None. CT MAXILLOFACIAL FINDINGS Osseous: Nondisplaced fracture is seen involving the lateral wall of the right maxillary sinus. No other are facial bone fractures are identified. Orbits: No evidence of fracture. Globes and other intraorbital anatomy are unremarkable. Sinuses: Right maxillary sinus air-fluid level. Soft tissues: Right frontal, maxillary, and preseptal soft tissue swelling is seen. CT CERVICAL SPINE FINDINGS Alignment: Normal. Skull base and vertebrae: No acute fracture. No primary bone lesion or focal pathologic process. Soft tissues and spinal canal: No prevertebral fluid or swelling. No visible  canal hematoma. 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 Mild to moderate degenerative disc disease seen at C5-6. Mild to moderate facet DJD present bilaterally at C4-5 and C5-6. Moderate atlantoaxial degenerative changes also seen. Upper chest: Negative. Other: None. IMPRESSION: 7 x 7 mm intraparenchymal hemorrhage in right frontal lobe. No evidence of mass effect or midline shift. Mild right frontal and temporal subarachnoid hemorrhage. Mild intraventricular hemorrhage in both occipital horns. No evidence of acute hydrocephalus. Nondisplaced fracture of the lateral wall of the right maxillary sinus. No evidence of acute cervical spine fracture or subluxation. Degenerative spondylosis and congenital fusion,  as described above. Critical Value/emergent results were called by telephone at the time of interpretation on 08/20/2016 at 8:41 pm to Dr. Trixie Dredge , who verbally acknowledged these results. Electronically Signed   By: Myles Rosenthal M.D.   On: 08/20/2016 20:43   Dg Chest Port 1 View  Result Date: 08/22/2016 CLINICAL DATA:  Decreased breath sounds. Patient unable to communicate any complaints. EXAM: PORTABLE CHEST 1 VIEW COMPARISON:  Portable chest x-ray of August 20, 2016 FINDINGS: The lungs remain mildly hypoinflated. The interstitial markings remain coarse. Patchy bibasilar opacities persist. The left hemidiaphragm is better demonstrated today. The cardiac silhouette remains enlarged. The pulmonary vascularity is mildly prominent centrally. The trachea is midline. There is calcification in the wall of the aortic arch. The bones are osteopenic. There are old rib deformities on the right. IMPRESSION: Bibasilar atelectasis or pneumonia more conspicuous than on the study of 2 days ago. Stable cardiomegaly without significant pulmonary vascular congestion. Thoracic aortic atherosclerosis. Electronically Signed   By: [name]  [place] M.D.   On: 08/22/2016 13:10   Dg Chest Port 1 View  Result  Date: 08/20/2016 CLINICAL DATA:  Fall.  Right hip fracture.  Pre-op respiratory exam EXAM: PORTABLE CHEST 1 VIEW COMPARISON:  08/31/2015 FINDINGS: Heart size remains normal. Aortic atherosclerosis. Low lung volumes again noted. Scarring again seen in left lung base. No evidence of pulmonary infiltrate or edema. No evidence of pneumothorax or pleural effusion. Several old right rib fracture deformities again noted. IMPRESSION: Stable left basilar scarring.  No active lung disease. Aortic atherosclerosis. Electronically Signed   By: Myles Rosenthal M.D.   On: 08/20/2016 20:12   Dg Hip Unilat W Or Wo Pelvis 2-3 Views Right  Result Date: 08/20/2016 CLINICAL DATA:  Fall.  Right hip pain.  Initial encounter. EXAM: DG HIP (WITH OR WITHOUT PELVIS) 2-3V RIGHT COMPARISON:  None. FINDINGS: Impacted subcapital right femoral neck fracture is seen. No evidence of dislocation. Generalized osteopenia. IMPRESSION: Impacted subcapital right femoral neck fracture. Electronically Signed   By: Myles Rosenthal M.D.   On: 08/20/2016 20:10   Ct Maxillofacial Wo Cm  Result Date: 08/20/2016 CLINICAL DATA:  Fall. Head, facial, and neck pain and swelling. Initial encounter. EXAM: CT HEAD WITHOUT CONTRAST CT MAXILLOFACIAL WITHOUT CONTRAST CT CERVICAL SPINE WITHOUT CONTRAST TECHNIQUE: Multidetector CT imaging of the head, cervical spine, and maxillofacial structures were performed using the standard protocol without intravenous contrast. Multiplanar CT image reconstructions of the cervical spine and maxillofacial structures were also generated. COMPARISON:  CT on 02/14/2016 and cervical spine CT on 08/29/2015 FINDINGS: CT HEAD FINDINGS Brain: 7 x 7 mm intraparenchymal hemorrhage seen in the right frontal lobe. Mild subarachnoid hemorrhage is seen in the right frontal and temporal regions, and mild intraventricular hemorrhage is seen in the occipital horns bilaterally. Ventricles remain stable in size. No evidence of acute cerebral infarct, edema,  mass effect, or midline shift. Mild to moderate diffuse cerebral atrophy again noted. Vascular: No hyperdense vessel or unexpected calcification. Skull: Normal. Negative for fracture or focal lesion. Other: None. CT MAXILLOFACIAL FINDINGS Osseous: Nondisplaced fracture is seen involving the lateral wall of the right maxillary sinus. No other are facial bone fractures are identified. Orbits: No evidence of fracture. Globes and other intraorbital anatomy are unremarkable. Sinuses: Right maxillary sinus air-fluid level. Soft tissues: Right frontal, maxillary, and preseptal soft tissue swelling is seen. CT CERVICAL SPINE FINDINGS Alignment: Normal. Skull base and vertebrae: No acute fracture. No primary bone lesion or focal pathologic process. Soft tissues and spinal  canal: No prevertebral fluid or swelling. No visible canal hematoma. 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 Mild to moderate degenerative disc disease seen at C5-6. Mild to moderate facet DJD present bilaterally at C4-5 and C5-6. Moderate atlantoaxial degenerative changes also seen. Upper chest: Negative. Other: None. IMPRESSION: 7 x 7 mm intraparenchymal hemorrhage in right frontal lobe. No evidence of mass effect or midline shift. Mild right frontal and temporal subarachnoid hemorrhage. Mild intraventricular hemorrhage in both occipital horns. No evidence of acute hydrocephalus. Nondisplaced fracture of the lateral wall of the right maxillary sinus. No evidence of acute cervical spine fracture or subluxation. Degenerative spondylosis and congenital fusion, as described above. Critical Value/emergent results were called by telephone at the time of interpretation on 08/20/2016 at 8:41 pm to Dr. Trixie Dredge , who verbally acknowledged these results. Electronically Signed   By: Myles Rosenthal M.D.   On: 08/20/2016 20:43    Assessment/Plan  Physical deconditioning With generalized weakness. Will have patient work with PT/OT as tolerated  to regain strength and restore function.  Fall precautions are in place. With his overall poor prognosis with advanced dementia and other medical comorbidities, will get palliative care consult for comfort care.  Intraparenchymal hemorrhage Post fall with trauma. Seen by neurosurgery in the hospital and plan is for conservative management. Monitor clinically for now.  Facial laceration Status post suture placement, healing well, no signs of infection at present. Will need suture removal on 08/28/16. To be followed by treatment nurse at the facility.   Right hip fracture Post fall. Medical management for now. Continue Tylenol 500 mg 3 times a day and get PMR consult.  Acute blood loss anemia With Bleed to his head. Monitor CBC.   Aspiration pneumonia Continue and complete his course of Augmentin on 08/31/2016. Aspiration precautions to be taken. Speech consult. Continue to nap nebulizer every 4 or as needed.  dysphagia With advanced dementia. Continue pured diet and thin liquids. Will need assistance with feeding and strict aspiration precautions.  Parkinson's disease with dementia Currently on donepezil 10 mg daily with Sinemet 25-100 milligrams 1-1/2 tablet 3 times a day. Provide supportive care. Given his advanced dementia, I don't think donepezil is helping much at this point. With goals of care pain of comfort care, discontinue donepezil. Also discontinue fish oil, multivitamin, and Ocuvite to reduce pill burden.  GERD Continue omeprazole 20 mg daily and monitor   Goals of care: long-term-care   labs/tests ordered: CBC, BMP in 1 week   Family/ staff Communication: reviewed care plan with patient's charge nurse and nursing supervisor    Oneal Grout, MD Internal Medicine [facility] [address] [place], [place] [address] Cell Phone (Monday-Friday 8 am - 5 pm): [phone] On Call: [phone] and follow prompts after 5 pm and on  weekends Office Phone: [phone] Office Fax: [phone]

## 2016-10-13 DEATH — deceased

## 2017-11-25 IMAGING — CR DG PELVIS 1-2V
1 series · 1 of 1 positions shown · non-contrast
Comparison: None.

CLINICAL DATA: Fall, unwitnessed.

EXAM:
PELVIS - 1-2 VIEW

[t pelvis ap]
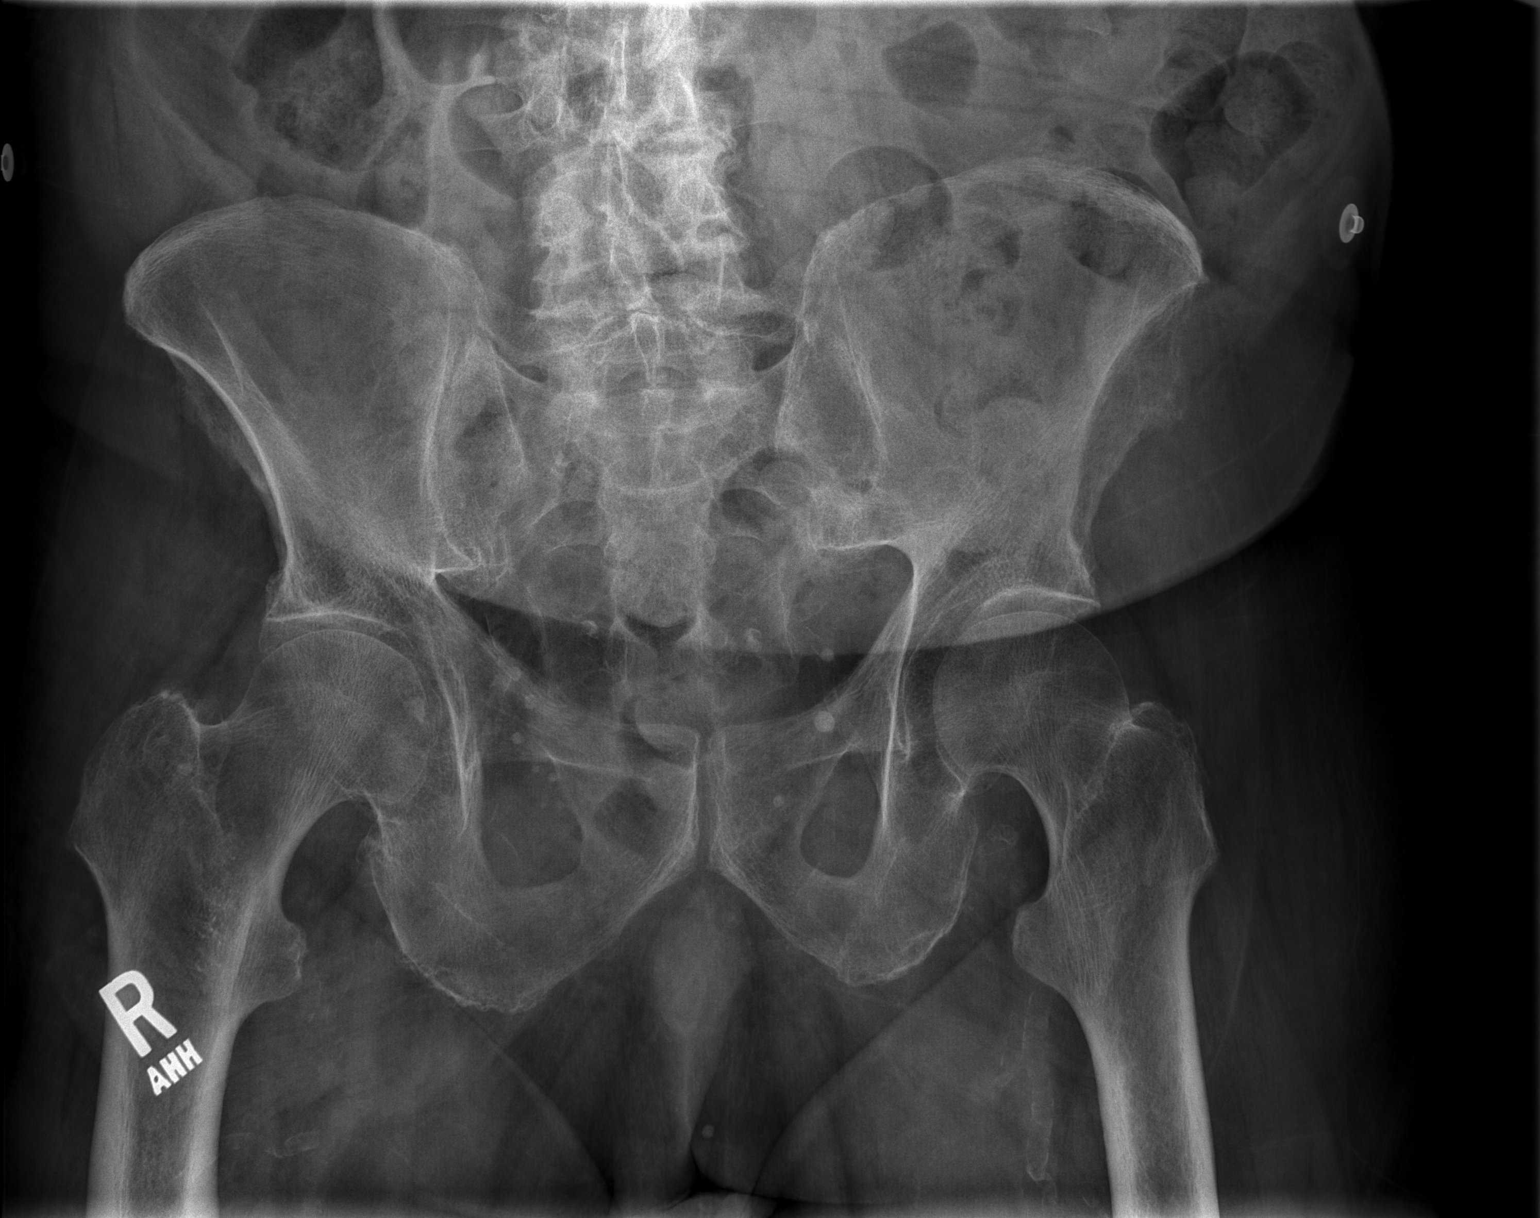

[1 of 1 positions shown; findings below may reference images not displayed]

FINDINGS: No fracture or diastasis in the pelvis. No evidence of hip
dislocation on this frontal view. Degenerative changes in the
visualized lower lumbar spine. Diffuse osteopenia. No focal osseous
lesions.
IMPRESSION: No fracture or diastasis in the pelvis.  Diffuse osteopenia.

## 2017-11-25 IMAGING — CT CT CERVICAL SPINE W/O CM
4 of 6 series · 13 of 33 positions shown, 15 images · non-contrast
Comparison: MRI brain 07/08/2009

CLINICAL DATA: Patient status post fall. History of Parkinson's.
Confusion. No reported loss consciousness.

EXAM:
CT HEAD WITHOUT CONTRAST
CT CERVICAL SPINE WITHOUT CONTRAST
TECHNIQUE: Multidetector CT imaging of the head and cervical spine was
performed following the standard protocol without intravenous
contrast. Multiplanar CT image reconstructions of the cervical spine
were also generated.

[Series 5: c-spine st · axial · 0.26mm/px · z∈[-166,-110]mm · 2 of 86 slices shown]
[im 29/86  bone]
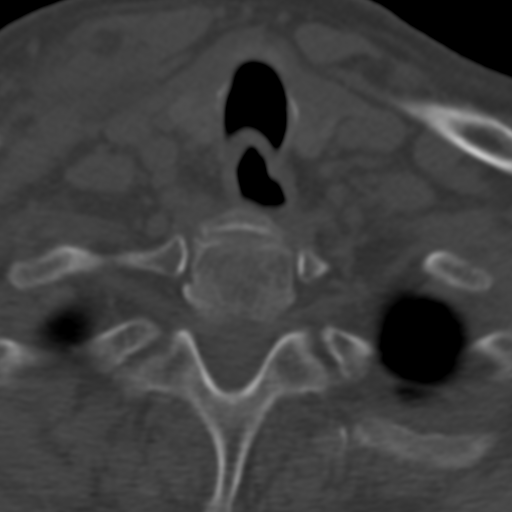
[im 57/86  bone]
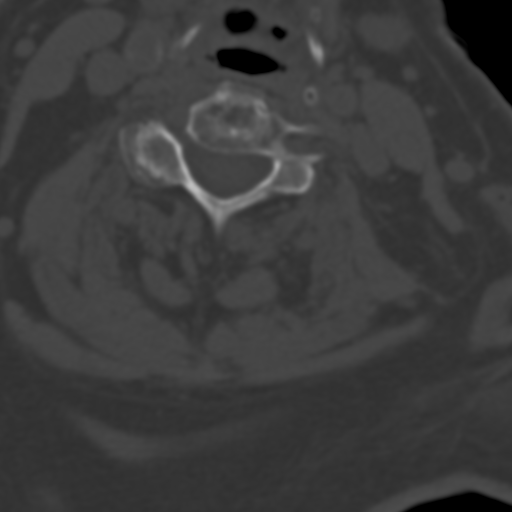

[Series 10: axial recon · axial · 0.25mm/px · z∈[-221,-116]mm · 3 of 111 slices shown, 4 images]
[im 28/111  soft-tissue]
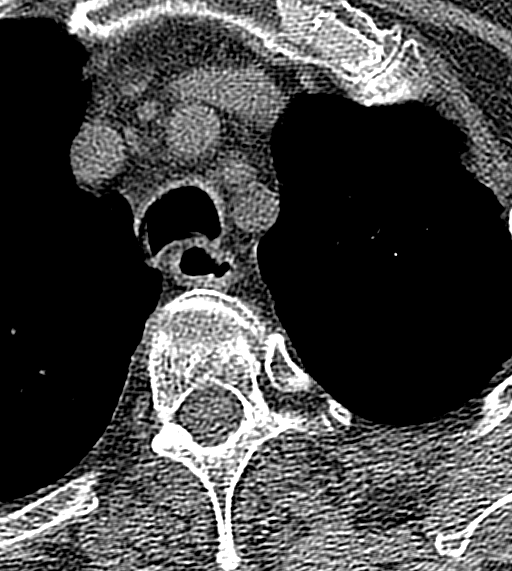
[im 28/111  bone]
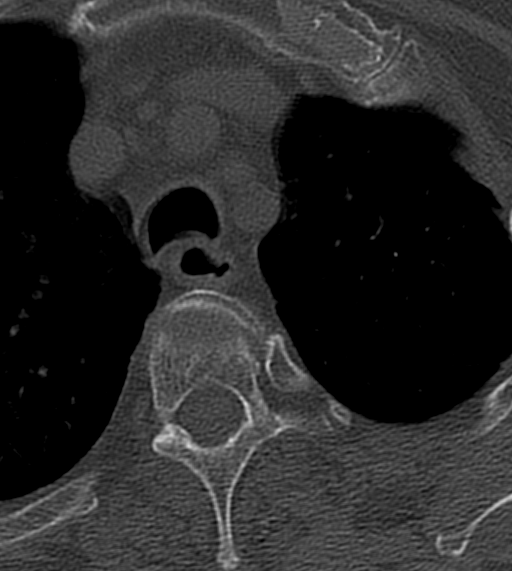
[im 56/111  bone]
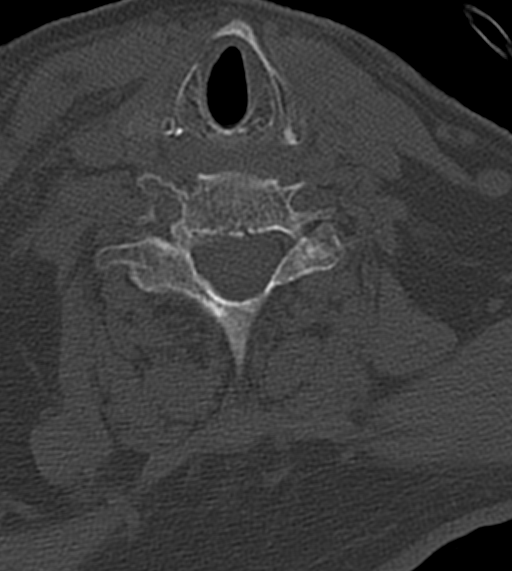
[im 83/111  bone]
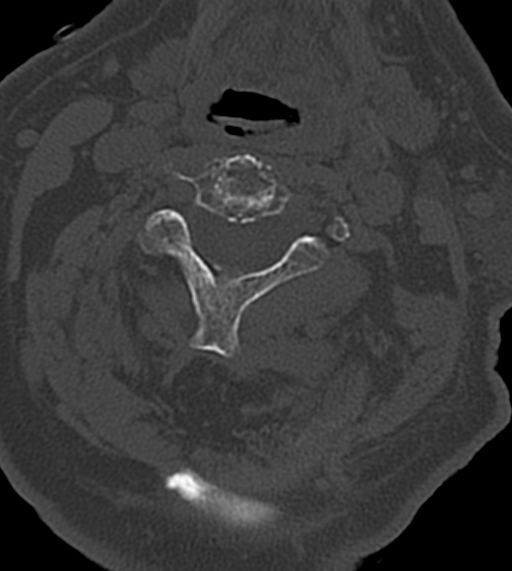

[Series 11: coronal · coronal · 0.27mm/px · 3 of 77 slices shown]
[im 16/77  bone]
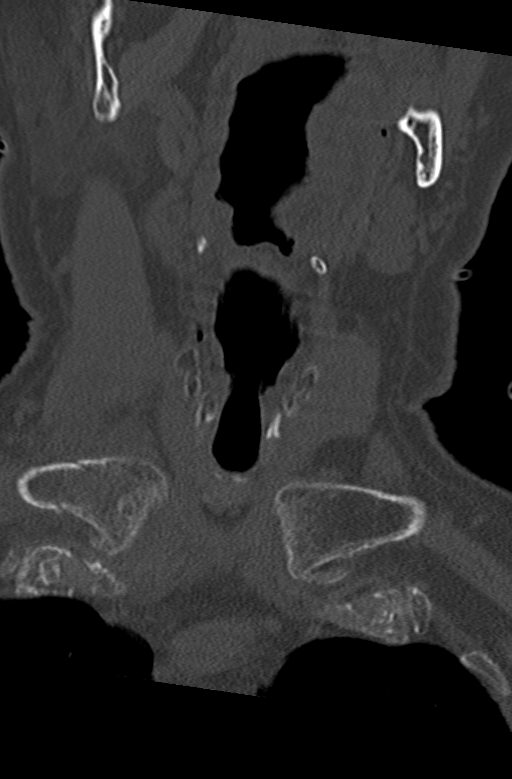
[im 31/77  bone]
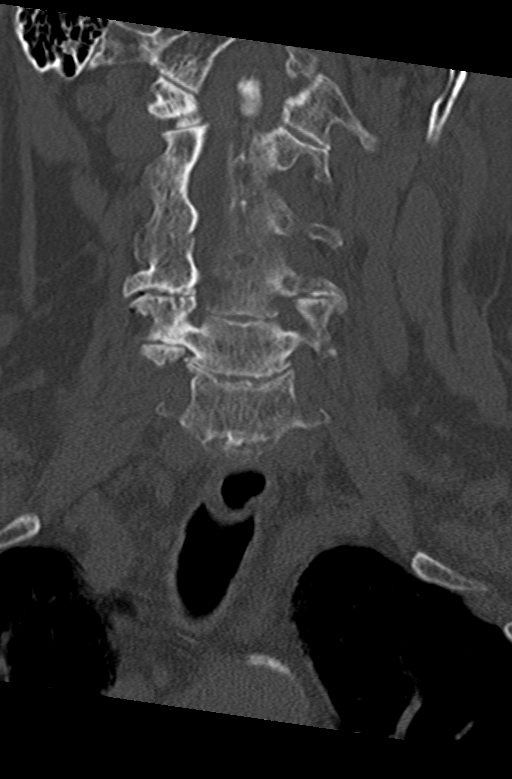
[im 46/77  bone]
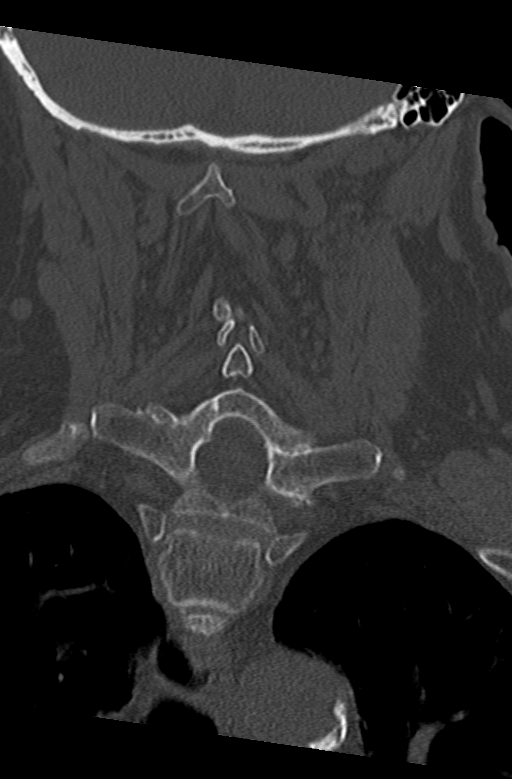

[Series 12: sagittal · sagittal · 0.29mm/px · 5 of 73 slices shown, 6 images]
[im 25/73  bone]
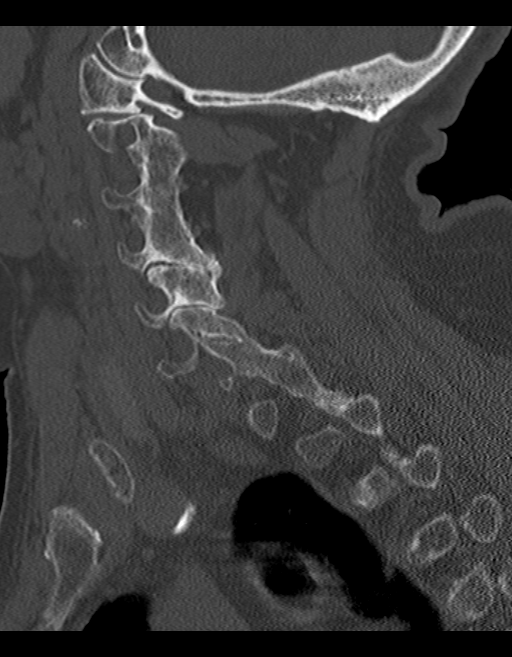
[im 31/73  bone]
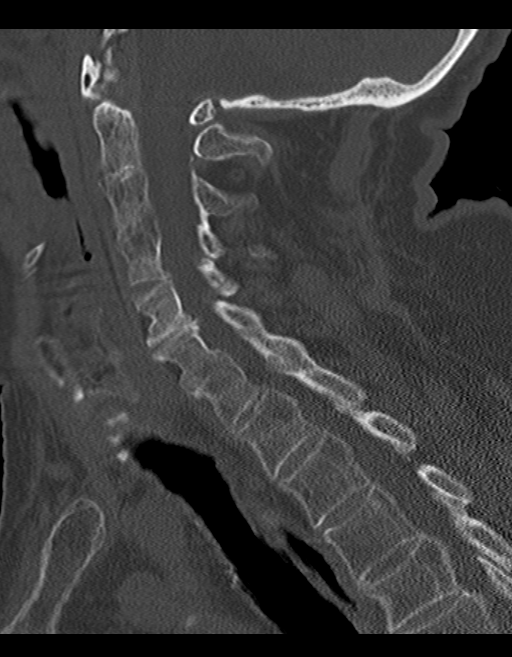
[im 37/73  soft-tissue]
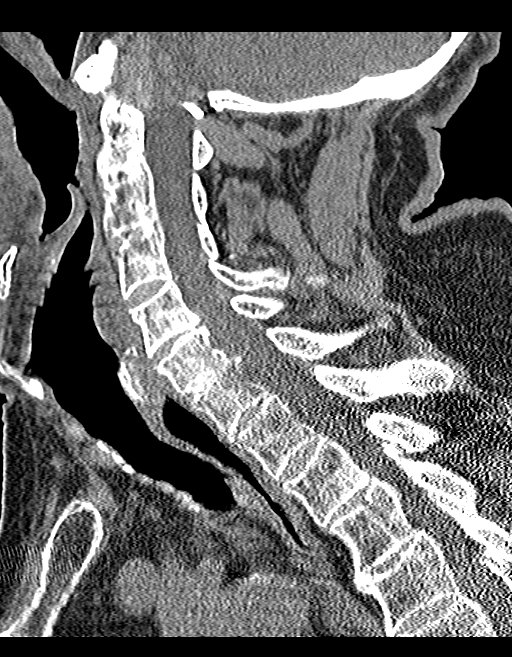
[im 37/73  bone]
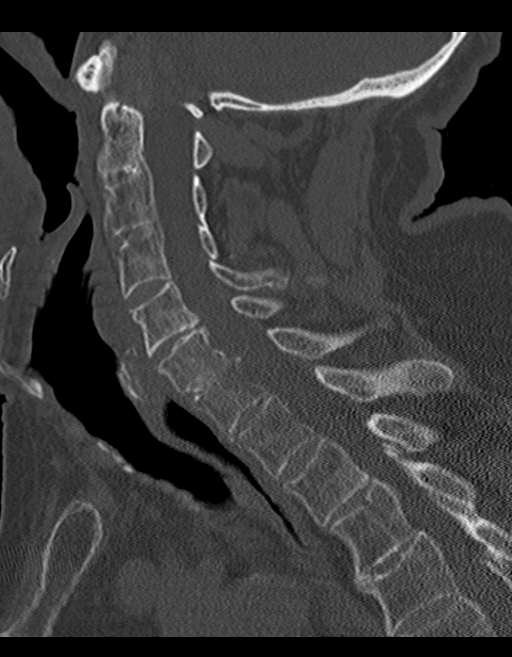
[im 43/73  bone]
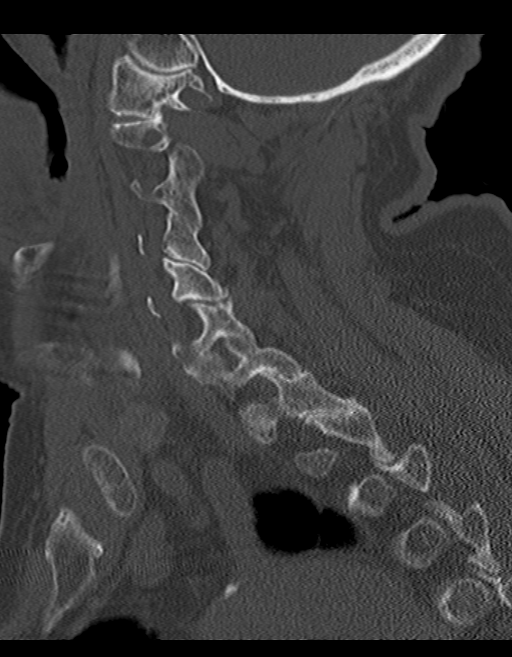
[im 49/73  bone]
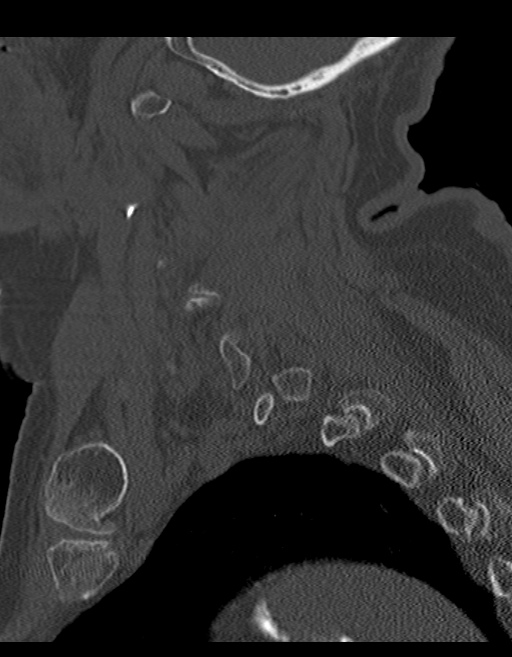

[13 of 33 positions shown; findings below may reference images not displayed]

FINDINGS: CT HEAD FINDINGS

Ventricles and sulci are appropriate for patient's age.
Periventricular and subcortical white matter hypodensity compatible
with chronic microvascular ischemic changes. Orbits are
unremarkable. Scalp soft tissues are unremarkable. Paranasal sinuses
are well aerated. Mastoid air cells are unremarkable. Calvarium is
intact.

CT CERVICAL SPINE FINDINGS

Normal anatomic alignment. Osseous fusion of the C2-3 and C3-4
levels. Additionally there is grade 1 anterolisthesis of C6-7 likely
secondary to facet degenerative changes and fusion at this level.
There is right-sided facet fusion at the C6-7 and C7-T1 levels. The
craniocervical junction is intact. Relative preservation of the
vertebral body heights.
IMPRESSION: No acute intracranial process.

No acute cervical spine fracture.

Chronic microvascular ischemic changes.

Multilevel cervical spinal fusion and associated degenerative
changes.
